# Patient Record
Sex: Male | Born: 1935 | Race: White | Hispanic: No | Marital: Married | Smoking: Former smoker
Health system: Southern US, Community
[De-identification: ages and names within clinical notes are randomized; demographics above are authoritative.]

## PROBLEM LIST (undated history)

## (undated) DIAGNOSIS — F0390 Unspecified dementia without behavioral disturbance: Secondary | ICD-10-CM

## (undated) DIAGNOSIS — Z87442 Personal history of urinary calculi: Secondary | ICD-10-CM

## (undated) DIAGNOSIS — R413 Other amnesia: Secondary | ICD-10-CM

## (undated) DIAGNOSIS — C801 Malignant (primary) neoplasm, unspecified: Secondary | ICD-10-CM

## (undated) DIAGNOSIS — I10 Essential (primary) hypertension: Secondary | ICD-10-CM

## (undated) DIAGNOSIS — N200 Calculus of kidney: Secondary | ICD-10-CM

## (undated) DIAGNOSIS — E039 Hypothyroidism, unspecified: Secondary | ICD-10-CM

## (undated) HISTORY — DX: Other amnesia: R41.3

## (undated) HISTORY — DX: Hypothyroidism, unspecified: E03.9

## (undated) HISTORY — PX: NISSEN FUNDOPLICATION: SHX2091

## (undated) HISTORY — PX: TOE AMPUTATION: SHX809

## (undated) HISTORY — PX: OTHER SURGICAL HISTORY: SHX169

## (undated) HISTORY — PX: EYE SURGERY: SHX253

## (undated) HISTORY — PX: CHOLECYSTECTOMY: SHX55

---

## 2015-01-11 ENCOUNTER — Emergency Department (HOSPITAL_BASED_OUTPATIENT_CLINIC_OR_DEPARTMENT_OTHER)
Admission: EM | Admit: 2015-01-11 | Discharge: 2015-01-11 | Disposition: A | Discharge status: Home / Self Care | Payer: Medicare Other | Attending: Emergency Medicine | Admitting: Emergency Medicine

## 2015-01-11 ENCOUNTER — Encounter (HOSPITAL_BASED_OUTPATIENT_CLINIC_OR_DEPARTMENT_OTHER): Payer: Self-pay | Admitting: Emergency Medicine

## 2015-01-11 ENCOUNTER — Emergency Department (HOSPITAL_BASED_OUTPATIENT_CLINIC_OR_DEPARTMENT_OTHER): Payer: Medicare Other

## 2015-01-11 DIAGNOSIS — Y9389 Activity, other specified: Secondary | ICD-10-CM | POA: Diagnosis not present

## 2015-01-11 DIAGNOSIS — W19XXXA Unspecified fall, initial encounter: Secondary | ICD-10-CM

## 2015-01-11 DIAGNOSIS — Z87891 Personal history of nicotine dependence: Secondary | ICD-10-CM | POA: Diagnosis not present

## 2015-01-11 DIAGNOSIS — Y998 Other external cause status: Secondary | ICD-10-CM | POA: Diagnosis not present

## 2015-01-11 DIAGNOSIS — Y92254 Theater (live) as the place of occurrence of the external cause: Secondary | ICD-10-CM | POA: Diagnosis not present

## 2015-01-11 DIAGNOSIS — S0181XA Laceration without foreign body of other part of head, initial encounter: Secondary | ICD-10-CM | POA: Insufficient documentation

## 2015-01-11 DIAGNOSIS — Z87442 Personal history of urinary calculi: Secondary | ICD-10-CM | POA: Diagnosis not present

## 2015-01-11 DIAGNOSIS — S0990XA Unspecified injury of head, initial encounter: Secondary | ICD-10-CM | POA: Diagnosis present

## 2015-01-11 DIAGNOSIS — W01198A Fall on same level from slipping, tripping and stumbling with subsequent striking against other object, initial encounter: Secondary | ICD-10-CM | POA: Diagnosis not present

## 2015-01-11 DIAGNOSIS — Z23 Encounter for immunization: Secondary | ICD-10-CM | POA: Diagnosis not present

## 2015-01-11 DIAGNOSIS — S0191XA Laceration without foreign body of unspecified part of head, initial encounter: Secondary | ICD-10-CM

## 2015-01-11 DIAGNOSIS — I10 Essential (primary) hypertension: Secondary | ICD-10-CM | POA: Insufficient documentation

## 2015-01-11 HISTORY — DX: Calculus of kidney: N20.0

## 2015-01-11 HISTORY — DX: Essential (primary) hypertension: I10

## 2015-01-11 MED ORDER — LIDOCAINE HCL (PF) 1 % IJ SOLN
INTRAMUSCULAR | Status: AC
  Filled 2015-01-11: qty 5

## 2015-01-11 MED ORDER — TETANUS-DIPHTH-ACELL PERTUSSIS 5-2.5-18.5 LF-MCG/0.5 IM SUSP
0.5000 mL | Freq: Once | INTRAMUSCULAR | Status: AC
  Administered 2015-01-11: 0.5 mL via INTRAMUSCULAR
  Filled 2015-01-11: qty 0.5

## 2015-01-11 MED ORDER — LIDOCAINE HCL (PF) 1 % IJ SOLN
5.0000 mL | Freq: Once | INTRAMUSCULAR | Status: AC
  Administered 2015-01-11: 5 mL

## 2015-01-11 MED ORDER — LIDOCAINE-EPINEPHRINE-TETRACAINE (LET) SOLUTION
3.0000 mL | Freq: Once | NASAL | Status: AC
  Administered 2015-01-11: 3 mL via TOPICAL
  Filled 2015-01-11: qty 3

## 2015-01-11 MED ORDER — LIDOCAINE-EPINEPHRINE (PF) 1 %-1:200000 IJ SOLN
20.0000 mL | Freq: Once | INTRAMUSCULAR | Status: DC
  Filled 2015-01-11: qty 20

## 2015-01-11 NOTE — ED Notes (Signed)
Pt alert, NAD, calm, interactive, resps e/u, speaking in clear complete sentences, VSS, (denies pain, sob, nausea, dizziness, weakness, sob, other injuries or pains or other sx), pt to CT at this time

## 2015-01-11 NOTE — Discharge Instructions (Signed)
Keep wound dry and do not remove dressing for 24 hours if possible. After that, wash gently morning and night (every 12 hours) with soap and water. Use a topical antibiotic ointment and cover with a bandaid or gauze.    Do NOT use rubbing alcohol or hydrogen peroxide, do not soak the area   After 10-14 days you can try to gently remove the outer part of the suture material with a tweezers. Your suture material is dissolving but the outside will not resolve.   Every attempt was made to remove foreign body (contaminants) from the wound.  However, there is always a chance that some may remain in the wound. This can  increase your risk of infection.   If you see signs of infection (warmth, redness, tenderness, pus, sharp increase in pain, fever, red streaking in the skin) immediately return to the emergency department.   After the wound heals fully, apply sunscreen for 6-12 months to minimize scarring.   Please follow with your primary care doctor in the next 2 days for a check-up. They must obtain records for further management.   Do not hesitate to return to the Emergency Department for any new, worsening or concerning symptoms.   Stitches, Staples, or Adhesive Wound Closure Health care providers use stitches (sutures), staples, and certain glue (skin adhesives) to hold skin together while it heals (wound closure). You may need this treatment after you have surgery or if you cut your skin accidentally. These methods help your skin to heal more quickly and make it less likely that you will have a scar. A wound may take several months to heal completely. The type of wound you have determines when your wound gets closed. In most cases, the wound is closed as soon as possible (primary skin closure). Sometimes, closure is delayed so the wound can be cleaned and allowed to heal naturally. This reduces the chance of infection. Delayed closure may be needed if your wound:  Is caused by a bite.  Happened  more than 6 hours ago.  Involves loss of skin or the tissues under the skin.  Has dirt or debris in it that cannot be removed.  Is infected. WHAT ARE THE DIFFERENT KINDS OF WOUND CLOSURES? There are many options for wound closure. The one that your health care provider uses depends on how deep and how large your wound is. Adhesive Glue To use this type of glue to close a wound, your health care provider holds the edges of the wound together and paints the glue on the surface of your skin. You may need more than one layer of glue. Then the wound may be covered with a light bandage (dressing). This type of skin closure may be used for small wounds that are not deep (superficial). Using glue for wound closure is less painful than other methods. It does not require a medicine that numbs the area (local anesthetic). This method also leaves nothing to be removed. Adhesive glue is often used for children and on facial wounds. Adhesive glue cannot be used for wounds that are deep, uneven, or bleeding. It is not used inside of a wound.  Adhesive Strips These strips are made of sticky (adhesive), porous paper. They are applied across your skin edges like a regular adhesive bandage. You leave them on until they fall off. Adhesive strips may be used to close very superficial wounds. They may also be used along with sutures to improve the closure of your skin edges.  Sutures Sutures are the oldest method of wound closure. Sutures can be made from natural substances, such as silk, or from synthetic materials, such as nylon and steel. They can be made from a material that your body can break down as your wound heals (absorbable), or they can be made from a material that needs to be removed from your skin (nonabsorbable). They come in many different strengths and sizes. Your health care provider attaches the sutures to a steel needle on one end. Sutures can be passed through your skin, or through the tissues  beneath your skin. Then they are tied and cut. Your skin edges may be closed in one continuous stitch or in separate stitches. Sutures are strong and can be used for all kinds of wounds. Absorbable sutures may be used to close tissues under the skin. The disadvantage of sutures is that they may cause skin reactions that lead to infection. Nonabsorbable sutures need to be removed. Staples When surgical staples are used to close a wound, the edges of your skin on both sides of the wound are brought close together. A staple is placed across the wound, and an instrument secures the edges together. Staples are often used to close surgical cuts (incisions). Staples are faster to use than sutures, and they cause less skin reaction. Staples need to be removed using a tool that bends the staples away from your skin. HOW DO I CARE FOR MY WOUND CLOSURE?  Take medicines only as directed by your health care provider.  If you were prescribed an antibiotic medicine for your wound, finish it all even if you start to feel better.  Use ointments or creams only as directed by your health care provider.  Wash your hands with soap and water before and after touching your wound.  Do not soak your wound in water. Do not take baths, swim, or use a hot tub until your health care provider approves.  Ask your health care provider when you can start showering. Cover your wound if directed by your health care provider.  Do not take out your own sutures or staples.  Do not pick at your wound. Picking can cause an infection.  Keep all follow-up visits as directed by your health care provider. This is important. HOW LONG WILL I HAVE MY WOUND CLOSURE?  Leave adhesive glue on your skin until the glue peels away.  Leave adhesive strips on your skin until the strips fall off.  Absorbable sutures will dissolve within several days.  Nonabsorbable sutures and staples must be removed. The location of the wound will  determine how long they stay in. This can range from several days to a couple of weeks. WHEN SHOULD I SEEK HELP FOR MY WOUND CLOSURE? Contact your health care provider if:  You have a fever.  You have chills.  You have drainage, redness, swelling, or pain at your wound.  There is a bad smell coming from your wound.  The skin edges of your wound start to separate after your sutures have been removed.  Your wound becomes thick, raised, and darker in color after your sutures come out (scarring).   This information is not intended to replace advice given to you by your health care provider. Make sure you discuss any questions you have with your health care provider.   Document Released: 11/09/2000 Document Revised: 03/07/2014 Document Reviewed: 07/24/2013 Elsevier Interactive Patient Education [employer].

## 2015-01-11 NOTE — ED Notes (Signed)
Pt in c/o laceration to R upper eyebrow after falling at the movie theater. Pt denies blood thinners, bleeding is controlled, he is alert, oriented and neuro intact.

## 2015-01-11 NOTE — ED Provider Notes (Signed)
CSN: [id number]     Arrival date & time 01/11/15  1908 History   First MD Initiated Contact with Patient 01/11/15 1913     Chief Complaint  Patient presents with  . Fall  . Head Laceration     (Consider location/radiation/quality/duration/timing/severity/associated sxs/prior Treatment) HPI   Blood pressure 145/66, pulse 61, temperature 97.5 F (36.4 C), temperature source Oral, resp. rate 20, height 5\' 10"  (1.778 m), weight 170 lb (77.111 kg), SpO2 100 %.  Joel Ibarra is a 80 y.o. male complaining of laceration to right temple after fall just prior to arrival. Patient is using a knee scooter secondary to nonhealing ulceration on left foot which required skin graft. He was exiting a movie in the ground was uneven, he fell forward striking his head. Patient is not anticoagulated. There was no loss of consciousness, no change in vision, nausea, vomiting, pain with eye movements or diplopia. There was no epistaxis, intraoral or dental pain, cervicalgia, chest pain, shortness of breath, abdominal pain, shoulder elbow, wrist knee or ankle pain. Patient has a mild trauma to the right second fingernail.  Past Medical History  Diagnosis Date  . Hypertension   . Kidney stones    History reviewed. No pertinent past surgical history. History reviewed. No pertinent family history. Social History  Substance Use Topics  . Smoking status: Former Research scientist (life sciences)  . Smokeless tobacco: None  . Alcohol Use: No    Review of Systems  10 systems reviewed and found to be negative, except as noted in the HPI.   Allergies  Bee venom  Home Medications   Prior to Admission medications   Not on File   BP 143/80 mmHg  Pulse 60  Temp(Src) 97.5 F (36.4 C) (Oral)  Resp 20  Ht 5\' 10"  (1.778 m)  Wt 170 lb (77.111 kg)  BMI 24.39 kg/m2  SpO2 96% Physical Exam  Constitutional: He is oriented to person, place, and time. He appears well-developed and well-nourished. No distress.  HENT:  Head:  Normocephalic.  Mouth/Throat: Oropharynx is clear and moist.  3 cm full-thickness laceration to left temple. No tenderness palpation along the orbital rim, no crepitance. Extraocular movement is intact without pain or diplopia, pupils are equal round and reactive. No blood in the nares. No intraoral blood or loose teeth  Eyes: Conjunctivae and EOM are normal. Pupils are equal, round, and reactive to light.  Neck: Normal range of motion.  Cardiovascular: Normal rate, regular rhythm and intact distal pulses.   Pulmonary/Chest: Effort normal and breath sounds normal.  Abdominal: Soft. Bowel sounds are normal. There is no tenderness.  Musculoskeletal: Normal range of motion.  Patient has surgical shoe and dressing in place 2 left foot  Neurological: He is alert and oriented to person, place, and time.  Skin: He is not diaphoretic.  Left third digit with some dried blood on the distal phalanx just underneath the nail bed. Nail is intact and adherent to the base.  Psychiatric: He has a normal mood and affect.  Nursing note and vitals reviewed.   ED Course  .Marland KitchenLaceration Repair Date/Time: 01/11/2015 9:52 PM Performed by: Monico Blitz Authorized by: Monico Blitz Consent: Verbal consent obtained. Risks and benefits: risks, benefits and alternatives were discussed Consent given by: patient Patient identity confirmed: verbally with patient Body area: head/neck Location details: left eyebrow Laceration length: 3 cm Contamination: The wound is contaminated. Foreign bodies: unknown Tendon involvement: none Nerve involvement: none Vascular damage: no Anesthesia: local infiltration Local anesthetic: lidocaine 1% without epinephrine  and LET (lido,epi,tetracaine) Anesthetic total: 3 ml Patient sedated: no Preparation: Patient was prepped and draped in the usual sterile fashion. Irrigation solution: saline Irrigation method: syringe Amount of cleaning: extensive Debridement:  none Degree of undermining: none Wound skin closure material used: 5-0 Vicryl Rapide. Number of sutures: 4 Technique: simple Approximation: close Approximation difficulty: simple Dressing: antibiotic ointment Patient tolerance: Patient tolerated the procedure well with no immediate complications   (including critical care time)   Labs Review Labs Reviewed - No data to display  Imaging Review No results found. I have personally reviewed and evaluated these images and lab results as part of my medical decision-making.   EKG Interpretation None      MDM   Final diagnoses:  Fall, initial encounter  Laceration of head, initial encounter    Filed Vitals:   01/11/15 1916 01/11/15 2000  BP: 143/80 145/66  Pulse: 60 61  Temp: 97.5 F (36.4 C)   TempSrc: Oral   Resp: 20   Height: 5\' 10"  (1.778 m)   Weight: 170 lb (77.111 kg)   SpO2: 96% 100%    Medications  lidocaine-EPINEPHrine (XYLOCAINE-EPINEPHrine) 1 %-1:200000 (PF) injection 20 mL (not administered)  Tdap (BOOSTRIX) injection 0.5 mL (0.5 mLs Intramuscular Given 01/11/15 1954)  lidocaine-EPINEPHrine-tetracaine (LET) solution (3 mLs Topical Given 01/11/15 1950)    Joel Ibarra is 80 y.o. male presenting with  facial laceration after patient scooter toppled over when it impacted her. There was head trauma with no loss of consciousness. Patient's neuro exam is nonfocal. He is not anticoagulated. He denies any cervical pain and any other trauma. Patient has laceration to left forehead which is cleaned and closed. Tetanus is updated. CT head and neck are negative.  This is a shared visit with the attending physician who personally evaluated the patient and agrees with the care plan.   Evaluation does not show pathology that would require ongoing emergent intervention or inpatient treatment. Pt is hemodynamically stable and mentating appropriately. Discussed findings and plan with patient/guardian, who agrees with care  plan. All questions answered. Return precautions discussed and outpatient follow up given.    Monico Blitz, PA-C 01/11/15 2154  Deno Etienne, DO 01/11/15 2319

## 2017-06-14 ENCOUNTER — Encounter (HOSPITAL_COMMUNITY): Payer: Self-pay

## 2017-06-14 ENCOUNTER — Inpatient Hospital Stay (HOSPITAL_COMMUNITY)
Admission: EM | Admit: 2017-06-14 | Discharge: 2017-06-20 | DRG: 373 | Disposition: A | Discharge status: Skilled Nursing Facility | Payer: Medicare Other | Attending: Internal Medicine | Admitting: Internal Medicine

## 2017-06-14 DIAGNOSIS — Z87891 Personal history of nicotine dependence: Secondary | ICD-10-CM

## 2017-06-14 DIAGNOSIS — R197 Diarrhea, unspecified: Secondary | ICD-10-CM | POA: Diagnosis present

## 2017-06-14 DIAGNOSIS — K529 Noninfective gastroenteritis and colitis, unspecified: Secondary | ICD-10-CM

## 2017-06-14 DIAGNOSIS — I10 Essential (primary) hypertension: Secondary | ICD-10-CM | POA: Diagnosis present

## 2017-06-14 DIAGNOSIS — F0390 Unspecified dementia without behavioral disturbance: Secondary | ICD-10-CM | POA: Diagnosis present

## 2017-06-14 DIAGNOSIS — E860 Dehydration: Secondary | ICD-10-CM

## 2017-06-14 DIAGNOSIS — R000 Tachycardia, unspecified: Secondary | ICD-10-CM

## 2017-06-14 DIAGNOSIS — Z7982 Long term (current) use of aspirin: Secondary | ICD-10-CM

## 2017-06-14 DIAGNOSIS — A0811 Acute gastroenteropathy due to Norwalk agent: Secondary | ICD-10-CM | POA: Diagnosis present

## 2017-06-14 DIAGNOSIS — A0472 Enterocolitis due to Clostridium difficile, not specified as recurrent: Principal | ICD-10-CM | POA: Diagnosis present

## 2017-06-14 DIAGNOSIS — Z79899 Other long term (current) drug therapy: Secondary | ICD-10-CM

## 2017-06-14 DIAGNOSIS — R112 Nausea with vomiting, unspecified: Secondary | ICD-10-CM

## 2017-06-14 HISTORY — DX: Unspecified dementia, unspecified severity, without behavioral disturbance, psychotic disturbance, mood disturbance, and anxiety: F03.90

## 2017-06-14 MED ORDER — SODIUM CHLORIDE 0.9 % IV BOLUS
1000.0000 mL | Freq: Once | INTRAVENOUS | Status: AC
  Administered 2017-06-14: 1000 mL via INTRAVENOUS

## 2017-06-14 NOTE — ED Notes (Signed)
Pt actively having diarrhea

## 2017-06-14 NOTE — ED Triage Notes (Signed)
Pt complains of diarrhea and a fever for two days Pt is admitted for the same sx this week

## 2017-06-14 NOTE — ED Provider Notes (Signed)
[facility] DEPT Provider Note: Georgena Spurling, MD, FACEP  CSN: [id number] MRN: [id number] ARRIVAL: 06/14/17 at 2128 ROOM: [facility]  Diarrhea  Level 5 caveat: Dementia HISTORY OF PRESENT ILLNESS  06/14/17 11:09 PM Joel Ibarra is a 83 y.o. male who developed nausea, vomiting and diarrhea yesterday.  His wife was recently hospitalized for similar symptoms.  The nausea and vomiting have resolved but he has had copious diarrhea throughout the day today.  The diarrhea has been nonbloody.  He has had associated abdominal pain but denies pain at the present time.  His daughters, who are providing the history, are not sure if he has had a fever.  He was noted to be tachycardic on arrival.  He is complaining of a dry mouth.   Past Medical History:  Diagnosis Date  . Dementia   . Hypertension   . Kidney stones     History reviewed. No pertinent surgical history.  History reviewed. No pertinent family history.  Social History   Tobacco Use  . Smoking status: Former Research scientist (life sciences)  . Smokeless tobacco: Never Used  Substance Use Topics  . Alcohol use: No  . Drug use: Never    Prior to Admission medications   Medication Sig Start Date End Date Taking? Authorizing Provider  aspirin EC 81 MG tablet Take 81 mg by mouth daily. 10/17/07  Yes [provider]  donepezil (ARICEPT) 10 MG tablet Take 5 mg by mouth 2 (two) times daily. 0.5 Tablet in AM, 0.5 Tablet at night 10/04/16  Yes [provider]  metoprolol succinate (TOPROL-XL) 100 MG 24 hr tablet Take 100 mg by mouth daily. 07/09/07  Yes [provider]  sertraline (ZOLOFT) 100 MG tablet Take 100 mg by mouth daily. 11/01/16  Yes [provider]    Allergies Bee venom; Codeine; Daptomycin; Clindamycin/lincomycin; Lincomycin; and Lactose   REVIEW OF SYSTEMS     PHYSICAL EXAMINATION  Initial Vital Signs Blood pressure (!) 145/82, pulse (!) 123, temperature 98.8 F (37.1 C),  temperature source Oral, resp. rate 20, SpO2 92 %.  Examination General: Well-developed, well-nourished male in no acute distress; appearance consistent with age of record HENT: normocephalic; atraumatic; dry mucous membranes Eyes: pupils equal, round and reactive to light; extraocular muscles intact; left pseudophakia Neck: supple Heart: regular rate and rhythm;  Lungs: clear to auscultation bilaterally Abdomen: soft; nondistended; nontender; bowel sounds present Extremities: Arthritic changes; pulses normal; surgical absence of left great toe Tachycardia neurologic: Awake, alert; motor function intact in all extremities and symmetric; no facial droop Skin: Warm and dry Psychiatric: Normal mood and affect   RESULTS  Summary of this visit's results, reviewed by myself:   EKG Interpretation  Date/Time:    Ventricular Rate:    PR Interval:    QRS Duration:   QT Interval:    QTC Calculation:   R Axis:     Text Interpretation:        Laboratory Studies: Results for orders placed or performed during the hospital encounter of 06/14/17 (from the past 24 hour(s))  Comprehensive metabolic panel     Status: Abnormal   Collection Time: 06/14/17 11:56 PM  Result Value Ref Range   Sodium 140 135 - 145 mmol/L   Potassium 3.9 3.5 - 5.1 mmol/L   Chloride 112 (H) 101 - 111 mmol/L   CO2 15 (L) 22 - 32 mmol/L   Glucose, Bld 201 (H) 65 - 99 mg/dL   BUN 24 (H) 6 - 20 mg/dL  Creatinine, Ser 1.25 (H) 0.61 - 1.24 mg/dL   Calcium 9.0 8.9 - 10.3 mg/dL   Total Protein 7.4 6.5 - 8.1 g/dL   Albumin 4.0 3.5 - 5.0 g/dL   AST 31 15 - 41 U/L   ALT 19 17 - 63 U/L   Alkaline Phosphatase 67 38 - 126 U/L   Total Bilirubin 1.0 0.3 - 1.2 mg/dL   GFR calc non Af Amer 53 (L) >60 mL/min   GFR calc Af Amer >60 >60 mL/min   Anion gap 13 5 - 15  CBC with Differential/Platelet     Status: Abnormal   Collection Time: 06/14/17 11:56 PM  Result Value Ref Range   WBC 6.1 4.0 - 10.5 K/uL   RBC 5.08 4.22 -  5.81 MIL/uL   Hemoglobin 15.8 13.0 - 17.0 g/dL   HCT 47.4 39.0 - 52.0 %   MCV 93.3 78.0 - 100.0 fL   MCH 31.1 26.0 - 34.0 pg   MCHC 33.3 30.0 - 36.0 g/dL   RDW 14.1 11.5 - 15.5 %   Platelets 127 (L) 150 - 400 K/uL   Neutrophils Relative % 96 %   Neutro Abs 5.9 1.7 - 7.7 K/uL   Lymphocytes Relative 2 %   Lymphs Abs 0.1 (L) 0.7 - 4.0 K/uL   Monocytes Relative 2 %   Monocytes Absolute 0.1 0.1 - 1.0 K/uL   Eosinophils Relative 0 %   Eosinophils Absolute 0.0 0.0 - 0.7 K/uL   Basophils Relative 0 %   Basophils Absolute 0.0 0.0 - 0.1 K/uL   Imaging Studies: No results found.  ED COURSE and MDM  Nursing notes and initial vitals signs, including pulse oximetry, reviewed.  Vitals:   06/14/17 2146 06/14/17 2257 06/15/17 0047  BP: (!) 145/82  129/78  Pulse: (!) 123  77  Resp: 20  18  Temp: 98.4 F (36.9 C) 98.8 F (37.1 C) 100 F (37.8 C)  TempSrc: Oral Oral Oral  SpO2: 92%  94%   11:33 PM IV fluid bolus initiated.  Patient placed on enteric precautions.   Hospitalist to admit patient for hydration.  Suspect viral gastroenteritis.  Stool studies ordered.  PROCEDURES    ED DIAGNOSES     ICD-10-CM   1. Gastroenteritis K52.9   2. Dehydration E86.0        Hara Milholland, Jenny Reichmann, MD 06/15/17 [phone]

## 2017-06-15 ENCOUNTER — Encounter (HOSPITAL_COMMUNITY): Payer: Self-pay | Admitting: Internal Medicine

## 2017-06-15 ENCOUNTER — Other Ambulatory Visit: Payer: Self-pay

## 2017-06-15 ENCOUNTER — Inpatient Hospital Stay (HOSPITAL_COMMUNITY): Payer: Medicare Other

## 2017-06-15 DIAGNOSIS — A0811 Acute gastroenteropathy due to Norwalk agent: Secondary | ICD-10-CM | POA: Diagnosis present

## 2017-06-15 DIAGNOSIS — R197 Diarrhea, unspecified: Secondary | ICD-10-CM | POA: Diagnosis not present

## 2017-06-15 DIAGNOSIS — R000 Tachycardia, unspecified: Secondary | ICD-10-CM

## 2017-06-15 DIAGNOSIS — R112 Nausea with vomiting, unspecified: Secondary | ICD-10-CM | POA: Diagnosis not present

## 2017-06-15 DIAGNOSIS — I10 Essential (primary) hypertension: Secondary | ICD-10-CM | POA: Diagnosis present

## 2017-06-15 DIAGNOSIS — E860 Dehydration: Secondary | ICD-10-CM | POA: Diagnosis present

## 2017-06-15 DIAGNOSIS — F0390 Unspecified dementia without behavioral disturbance: Secondary | ICD-10-CM | POA: Diagnosis present

## 2017-06-15 DIAGNOSIS — A09 Infectious gastroenteritis and colitis, unspecified: Secondary | ICD-10-CM | POA: Diagnosis not present

## 2017-06-15 DIAGNOSIS — A0472 Enterocolitis due to Clostridium difficile, not specified as recurrent: Secondary | ICD-10-CM | POA: Diagnosis present

## 2017-06-15 DIAGNOSIS — Z7982 Long term (current) use of aspirin: Secondary | ICD-10-CM | POA: Diagnosis not present

## 2017-06-15 DIAGNOSIS — Z87891 Personal history of nicotine dependence: Secondary | ICD-10-CM | POA: Diagnosis not present

## 2017-06-15 DIAGNOSIS — Z79899 Other long term (current) drug therapy: Secondary | ICD-10-CM | POA: Diagnosis not present

## 2017-06-15 LAB — CBC WITH DIFFERENTIAL/PLATELET
BASOS PCT: 0 %
Basophils Absolute: 0 10*3/uL (ref 0.0–0.1)
Eosinophils Absolute: 0 10*3/uL (ref 0.0–0.7)
Eosinophils Relative: 0 %
HEMATOCRIT: 47.4 % (ref 39.0–52.0)
Hemoglobin: 15.8 g/dL (ref 13.0–17.0)
Lymphocytes Relative: 2 %
Lymphs Abs: 0.1 10*3/uL — ABNORMAL LOW (ref 0.7–4.0)
MCH: 31.1 pg (ref 26.0–34.0)
MCHC: 33.3 g/dL (ref 30.0–36.0)
MCV: 93.3 fL (ref 78.0–100.0)
MONO ABS: 0.1 10*3/uL (ref 0.1–1.0)
Monocytes Relative: 2 %
NEUTROS ABS: 5.9 10*3/uL (ref 1.7–7.7)
Neutrophils Relative %: 96 %
PLATELETS: 127 10*3/uL — AB (ref 150–400)
RBC: 5.08 MIL/uL (ref 4.22–5.81)
RDW: 14.1 % (ref 11.5–15.5)
WBC: 6.1 10*3/uL (ref 4.0–10.5)

## 2017-06-15 LAB — URINALYSIS, ROUTINE W REFLEX MICROSCOPIC
Bilirubin Urine: NEGATIVE
GLUCOSE, UA: NEGATIVE mg/dL
Ketones, ur: NEGATIVE mg/dL
Leukocytes, UA: NEGATIVE
NITRITE: NEGATIVE
Protein, ur: 30 mg/dL — AB
SPECIFIC GRAVITY, URINE: 1.026 (ref 1.005–1.030)
pH: 5 (ref 5.0–8.0)

## 2017-06-15 LAB — CBC
HCT: 47.7 % (ref 39.0–52.0)
HEMOGLOBIN: 15.8 g/dL (ref 13.0–17.0)
MCH: 31.3 pg (ref 26.0–34.0)
MCHC: 33.1 g/dL (ref 30.0–36.0)
MCV: 94.5 fL (ref 78.0–100.0)
PLATELETS: 130 10*3/uL — AB (ref 150–400)
RBC: 5.05 MIL/uL (ref 4.22–5.81)
RDW: 14.3 % (ref 11.5–15.5)
WBC: 9.1 10*3/uL (ref 4.0–10.5)

## 2017-06-15 LAB — COMPREHENSIVE METABOLIC PANEL
ALBUMIN: 4 g/dL (ref 3.5–5.0)
ALK PHOS: 62 U/L (ref 38–126)
ALT: 19 U/L (ref 17–63)
ALT: 23 U/L (ref 17–63)
ANION GAP: 9 (ref 5–15)
ANION GAP: 13 (ref 5–15)
AST: 31 U/L (ref 15–41)
AST: 34 U/L (ref 15–41)
Albumin: 3.7 g/dL (ref 3.5–5.0)
Alkaline Phosphatase: 67 U/L (ref 38–126)
BILIRUBIN TOTAL: 0.9 mg/dL (ref 0.3–1.2)
BILIRUBIN TOTAL: 1 mg/dL (ref 0.3–1.2)
BUN: 20 mg/dL (ref 6–20)
BUN: 24 mg/dL — ABNORMAL HIGH (ref 6–20)
CALCIUM: 8.8 mg/dL — AB (ref 8.9–10.3)
CO2: 15 mmol/L — ABNORMAL LOW (ref 22–32)
CO2: 21 mmol/L — ABNORMAL LOW (ref 22–32)
Calcium: 9 mg/dL (ref 8.9–10.3)
Chloride: 112 mmol/L — ABNORMAL HIGH (ref 101–111)
Chloride: 116 mmol/L — ABNORMAL HIGH (ref 101–111)
Creatinine, Ser: 1.14 mg/dL (ref 0.61–1.24)
Creatinine, Ser: 1.25 mg/dL — ABNORMAL HIGH (ref 0.61–1.24)
GFR calc non Af Amer: 53 mL/min — ABNORMAL LOW (ref >60)
GFR, EST NON AFRICAN AMERICAN: 59 mL/min — AB (ref >60)
GLUCOSE: 201 mg/dL — AB (ref 65–99)
Glucose, Bld: 92 mg/dL (ref 65–99)
Potassium: 3.8 mmol/L (ref 3.5–5.1)
Potassium: 3.9 mmol/L (ref 3.5–5.1)
Sodium: 140 mmol/L (ref 135–145)
Sodium: 146 mmol/L — ABNORMAL HIGH (ref 135–145)
TOTAL PROTEIN: 7.2 g/dL (ref 6.5–8.1)
Total Protein: 7.4 g/dL (ref 6.5–8.1)

## 2017-06-15 LAB — GASTROINTESTINAL PANEL BY PCR, STOOL (REPLACES STOOL CULTURE)

## 2017-06-15 LAB — C DIFFICILE QUICK SCREEN W PCR REFLEX
C DIFFICILE (CDIFF) TOXIN: POSITIVE — AB
C DIFFICLE (CDIFF) ANTIGEN: POSITIVE — AB
C Diff interpretation: DETECTED

## 2017-06-15 LAB — HEMOGLOBIN A1C
HEMOGLOBIN A1C: 5.5 % (ref 4.8–5.6)
Mean Plasma Glucose: 111.15 mg/dL

## 2017-06-15 LAB — TSH: TSH: 2.198 u[IU]/mL (ref 0.350–4.500)

## 2017-06-15 LAB — CBG MONITORING, ED: Glucose-Capillary: 90 mg/dL (ref 65–99)

## 2017-06-15 LAB — TROPONIN I: TROPONIN I: 0.04 ng/mL — AB (ref <0.03)

## 2017-06-15 MED ORDER — DONEPEZIL HCL 5 MG PO TABS
5.0000 mg | ORAL_TABLET | Freq: Two times a day (BID) | ORAL | Status: DC
  Administered 2017-06-15 – 2017-06-20 (×11): 5 mg via ORAL
  Filled 2017-06-15 (×11): qty 1

## 2017-06-15 MED ORDER — METOPROLOL SUCCINATE ER 100 MG PO TB24
100.0000 mg | ORAL_TABLET | Freq: Every day | ORAL | Status: DC
  Administered 2017-06-15: 100 mg via ORAL
  Filled 2017-06-15: qty 1

## 2017-06-15 MED ORDER — CHLORHEXIDINE GLUCONATE 0.12 % MT SOLN
15.0000 mL | Freq: Two times a day (BID) | OROMUCOSAL | Status: DC
  Administered 2017-06-15 – 2017-06-20 (×10): 15 mL via OROMUCOSAL
  Filled 2017-06-15 (×10): qty 15

## 2017-06-15 MED ORDER — VANCOMYCIN 50 MG/ML ORAL SOLUTION
125.0000 mg | Freq: Four times a day (QID) | ORAL | Status: DC
  Administered 2017-06-15 – 2017-06-20 (×21): 125 mg via ORAL
  Filled 2017-06-15 (×27): qty 2.5

## 2017-06-15 MED ORDER — ONDANSETRON HCL 4 MG/2ML IJ SOLN: 4.0000 mg | Freq: Four times a day (QID) | INTRAMUSCULAR | Status: DC | PRN

## 2017-06-15 MED ORDER — TRAZODONE HCL 50 MG PO TABS
25.0000 mg | ORAL_TABLET | Freq: Every evening | ORAL | Status: DC | PRN
  Administered 2017-06-17 – 2017-06-19 (×3): 25 mg via ORAL
  Filled 2017-06-15 (×3): qty 1

## 2017-06-15 MED ORDER — SODIUM CHLORIDE 0.9 % IV SOLN: INTRAVENOUS | Status: DC

## 2017-06-15 MED ORDER — SODIUM CHLORIDE 0.9 % IV SOLN
INTRAVENOUS | Status: AC
  Administered 2017-06-15 (×2): via INTRAVENOUS

## 2017-06-15 MED ORDER — METRONIDAZOLE IN NACL 5-0.79 MG/ML-% IV SOLN
500.0000 mg | Freq: Three times a day (TID) | INTRAVENOUS | Status: DC
  Administered 2017-06-15: 500 mg via INTRAVENOUS
  Filled 2017-06-15: qty 100

## 2017-06-15 MED ORDER — SODIUM CHLORIDE 0.9% FLUSH
3.0000 mL | Freq: Two times a day (BID) | INTRAVENOUS | Status: DC
  Administered 2017-06-15 – 2017-06-20 (×9): 3 mL via INTRAVENOUS

## 2017-06-15 MED ORDER — ENOXAPARIN SODIUM 40 MG/0.4ML ~~LOC~~ SOLN
40.0000 mg | SUBCUTANEOUS | Status: DC
  Administered 2017-06-15 – 2017-06-20 (×6): 40 mg via SUBCUTANEOUS
  Filled 2017-06-15 (×7): qty 0.4

## 2017-06-15 MED ORDER — SERTRALINE HCL 100 MG PO TABS
100.0000 mg | ORAL_TABLET | Freq: Every day | ORAL | Status: DC
  Administered 2017-06-15 – 2017-06-20 (×6): 100 mg via ORAL
  Filled 2017-06-15: qty 1
  Filled 2017-06-15: qty 2
  Filled 2017-06-15 (×4): qty 1

## 2017-06-15 MED ORDER — ENOXAPARIN SODIUM 40 MG/0.4ML ~~LOC~~ SOLN: 40.0000 mg | SUBCUTANEOUS | Status: DC

## 2017-06-15 MED ORDER — ACETAMINOPHEN 325 MG PO TABS: 650.0000 mg | ORAL_TABLET | Freq: Four times a day (QID) | ORAL | Status: DC | PRN

## 2017-06-15 MED ORDER — ACETAMINOPHEN 650 MG RE SUPP: 650.0000 mg | Freq: Four times a day (QID) | RECTAL | Status: DC | PRN

## 2017-06-15 MED ORDER — ONDANSETRON HCL 4 MG/2ML IJ SOLN
4.0000 mg | Freq: Once | INTRAMUSCULAR | Status: AC
  Administered 2017-06-15: 4 mg via INTRAVENOUS
  Filled 2017-06-15: qty 2

## 2017-06-15 MED ORDER — ASPIRIN EC 81 MG PO TBEC
81.0000 mg | DELAYED_RELEASE_TABLET | Freq: Every day | ORAL | Status: DC
  Administered 2017-06-15 – 2017-06-20 (×6): 81 mg via ORAL
  Filled 2017-06-15 (×6): qty 1

## 2017-06-15 MED ORDER — ONDANSETRON HCL 4 MG PO TABS: 4.0000 mg | ORAL_TABLET | Freq: Four times a day (QID) | ORAL | Status: DC | PRN

## 2017-06-15 MED ORDER — LORAZEPAM 2 MG/ML IJ SOLN
0.5000 mg | Freq: Every day | INTRAMUSCULAR | Status: DC | PRN
  Administered 2017-06-15: 0.5 mg via INTRAVENOUS
  Filled 2017-06-15: qty 1

## 2017-06-15 MED ORDER — DEXTROSE-NACL 5-0.9 % IV SOLN
Freq: Once | INTRAVENOUS | Status: AC
  Administered 2017-06-15: 03:00:00 via INTRAVENOUS

## 2017-06-15 MED ORDER — ORAL CARE MOUTH RINSE
15.0000 mL | Freq: Two times a day (BID) | OROMUCOSAL | Status: DC
  Administered 2017-06-15 – 2017-06-20 (×6): 15 mL via OROMUCOSAL

## 2017-06-15 NOTE — ED Notes (Signed)
Upon this nurse taking report, this nurse noted patient's condom catheter was no longer properly fitted to patient penis. This nurse provided pericare and replaced condom catheter. Will attempt to collect urine sample at a later point.

## 2017-06-15 NOTE — H&P (Signed)
TRH H&P   Patient Demographics:    Raj Landress, is a 83 y.o. male  MRN: [id number]   DOB - August 30, 1934  Admit Date - 06/14/2017  Outpatient Primary MD for the patient is System, Pcp Not In  Referring MD/NP/PA: [place]  Outpatient Specialists:   Patient coming from: home  Chief Complaint  Patient presents with  . Diarrhea      HPI:    Mamoudou Mulvehill  is a 83 y.o. male, w Dementia, hypertension apparently has diarrhea since Tuesday.  His wife was admitted for diarrhea on Monday.  Pt states that he is having multiple stools "loose stool" per day.  Pt notes slight lower abdominal cramping.  1 episode of n/v in the ED.  Pt denies fever, chills, cough, cp, palp, sob, constipation, brbpr, black stool.    In Ed,  Na 140, K 3.9 Bun 24, Creatinine 1.25 Glucose 201 Ast 31, Alt 19  GI pathogen panel, and C. Diff pending Urinalysis pending  Pt will be admitted for n/v, diarrhea.      Review of systems:    In addition to the HPI above,  No Fever-chills, No Headache, No changes with Vision or hearing, No problems swallowing food or Liquids, No Chest pain, Cough or Shortness of Breath, No Blood in stool or Urine, No dysuria, No new skin rashes or bruises, No new joints pains-aches,  No new weakness, tingling, numbness in any extremity, No recent weight gain or loss, No polyuria, polydypsia or polyphagia, No significant Mental Stressors.  A full 10 point Review of Systems was done, except as stated above, all other Review of Systems were negative.   With Past History of the following :    Past Medical History:  Diagnosis Date  . Dementia   . Hypertension   . Kidney stones       History reviewed. No pertinent surgical history.    Social History:     Social History   Tobacco Use  . Smoking status: Former Research scientist (life sciences)  . Smokeless tobacco: Never Used    Substance Use Topics  . Alcohol use: No     Lives - at home  Mobility - unclear   Family History :     Family History  Family history unknown: Yes   Unknown due to dementia    Home Medications:   Prior to Admission medications   Medication Sig Start Date End Date Taking? Authorizing Provider  aspirin EC 81 MG tablet Take 81 mg by mouth daily. 10/17/07  Yes [provider]  donepezil (ARICEPT) 10 MG tablet Take 5 mg by mouth 2 (two) times daily. 0.5 Tablet in AM, 0.5 Tablet at night 10/04/16  Yes [provider]  metoprolol succinate (TOPROL-XL) 100 MG 24 hr tablet Take 100 mg by mouth daily. 07/09/07  Yes [provider]  sertraline (ZOLOFT) 100 MG tablet Take 100 mg  by mouth daily. 11/01/16  Yes [provider]     Allergies:     Allergies  Allergen Reactions  . Bee Venom Swelling  . Codeine Nausea Only, Nausea And Vomiting, Other (See Comments) and Swelling    Other Reaction: Other reaction Other reaction(s): Swelling Other reaction(s): Swelling Other reaction(s): Swelling   . Daptomycin Other (See Comments)    Pneumonitis  . Clindamycin/Lincomycin Rash  . Lincomycin Rash  . Lactose Other (See Comments) and Diarrhea    Other reaction(s): Cramps (ALLERGY/intolerance) Other reaction(s): Cramps (ALLERGY/intolerance) Other reaction(s): Cramps (ALLERGY/intolerance) Other reaction: Cramps (ALLERGY/intolerance)--Pt avoids ALL lactose containing items including items with lactose baked in. JLS 11/25/16. Other reaction(s): Cramps (ALLERGY/intolerance) Other reaction(s): Cramps (ALLERGY/intolerance) Other reaction(s): Cramps (ALLERGY/intolerance)      Physical Exam:   Vitals  Blood pressure 129/78, pulse 77, temperature 100 F (37.8 C), temperature source Oral, resp. rate 18, SpO2 94 %.   1. General  lying in bed in NAD,    2. Normal affect and insight, Not Suicidal or Homicidal, Awake Alert, Oriented X 1  3. No F.N deficits,  ALL C.Nerves Intact, Strength 5/5 all 4 extremities, Sensation intact all 4 extremities, Plantars down going.  4. Ears and Eyes appear Normal, Conjunctivae clear, PERRLA. Moist Oral Mucosa.  5. Supple Neck, No JVD, No cervical lymphadenopathy appriciated, No Carotid Bruits.  6. Symmetrical Chest wall movement, Good air movement bilaterally, CTAB.  7. RRR, No Gallops, Rubs or Murmurs, No Parasternal Heave.  8. Positive Bowel Sounds, Abdomen Soft, No tenderness, No organomegaly appriciated,No rebound -guarding or rigidity.  9.  No Cyanosis, Normal Skin Turgor, No Skin Rash or Bruise.  10. Good muscle tone,  joints appear normal , no effusions, Normal ROM.  11. No Palpable Lymph Nodes in Neck or Axillae     Data Review:    CBC Recent Labs  Lab 06/14/17 2356  WBC 6.1  HGB 15.8  HCT 47.4  PLT 127*  MCV 93.3  MCH 31.1  MCHC 33.3  RDW 14.1  LYMPHSABS 0.1*  MONOABS 0.1  EOSABS 0.0  BASOSABS 0.0   ------------------------------------------------------------------------------------------------------------------  Chemistries  Recent Labs  Lab 06/14/17 2356  NA 140  K 3.9  CL 112*  CO2 15*  GLUCOSE 201*  BUN 24*  CREATININE 1.25*  CALCIUM 9.0  AST 31  ALT 19  ALKPHOS 67  BILITOT 1.0   ------------------------------------------------------------------------------------------------------------------ CrCl cannot be calculated (Unknown ideal weight.). ------------------------------------------------------------------------------------------------------------------ No results for input(s): TSH, T4TOTAL, T3FREE, THYROIDAB in the last 72 hours.  Invalid input(s): FREET3  Coagulation profile No results for input(s): INR, PROTIME in the last 168 hours. ------------------------------------------------------------------------------------------------------------------- No results for input(s): DDIMER in the last 72  hours. -------------------------------------------------------------------------------------------------------------------  Cardiac Enzymes No results for input(s): CKMB, TROPONINI, MYOGLOBIN in the last 168 hours.  Invalid input(s): CK ------------------------------------------------------------------------------------------------------------------ No results found for: BNP   ---------------------------------------------------------------------------------------------------------------  Urinalysis No results found for: COLORURINE, APPEARANCEUR, LABSPEC, [place], GLUCOSEU, HGBUR, BILIRUBINUR, KETONESUR, PROTEINUR, UROBILINOGEN, NITRITE, LEUKOCYTESUR  ----------------------------------------------------------------------------------------------------------------   Imaging Results:    No results found.     Assessment & Plan:    Principal Problem:   Diarrhea Active Problems:   Nausea & vomiting   Tachycardia   Diarrhea Stool for C. Diff Gi pathogen panel Flagyl 500mg  iv q8h  N/v Zofran 4mg  iv q6h prn  Abdominal discomfort Check CT scan abd/ pelvis  Tachycardia Tele 12 lead ekg Trop I q6h x3 Check TSH   DVT Prophylaxis  Lovenox - SCDs   AM Labs Ordered, also  please review Full Orders  Family Communication: Admission, patients condition and plan of care including tests being ordered have been discussed with the patient  who indicate understanding and agree with the plan and Code Status.  Code Status FULL CODE  Likely DC to  home  Condition GUARDED    Consults called: none  Admission status: inpatient   Time spent in minutes : 45   Jani Gravel M.D on 06/15/2017 at 2:46 AM  Between 7am to 7pm - Pager - [phone]   After 7pm go to [email or web address] - password [facility]  [facility] - Office  [phone]

## 2017-06-15 NOTE — Progress Notes (Signed)
GI Panel positive for Norovirus;  Attending MD notified via text page

## 2017-06-15 NOTE — ED Notes (Signed)
Called to give report to [facility] , in who stated that she spoke with [facility] to have pt re-routed, stated she would contact [facility]. Charge RN made aware.

## 2017-06-15 NOTE — Progress Notes (Signed)
PROGRESS NOTE  Joel Ibarra LGX:[id number] DOB: 21-Mar-1934 DOA: 06/14/2017 PCP: System, Pcp Not In  HPI/Recap of past 24 hours: Joel Ibarra  is a 83 y.o. male, with past medical history significant for dementia, hypertension presents to the ED complaining of watery diarrhea with multiple episodes per day for the past 2 days prior to admission.  Patient reports associated mild lower abdominal cramping, and had one episode of nausea and vomiting in the ED.  Patient denies any fever/chills.  Of note, his wife was admitted for diarrhea on Monday here at [facility].  Patient was found to be positive for C. Difficile.  Patient admitted for further management.   Today, patient still having diarrhea, reports improved lower abdominal cramping, no further vomiting.  No fever noted.  Denies any chest pain, shortness of breath, bloody diarrhea, dizziness.   Assessment/Plan: Principal Problem:   Diarrhea Active Problems:   Nausea & vomiting   Tachycardia  C. difficile colitis Afebrile, no leukocytosis Still having multiple episodes of watery diarrhea + mild lower abdominal cramping C. difficile toxin positive stool CT scan of the abdomen showed mild wall thickening and stranding around the descending colon concerning for early infectious or ischemic colitis Started patient on oral vancomycin for 10 days Continue IV fluids Enteric precautions, monitor closely  Hypertension BP currently soft Hold home metoprolol  Dementia Continue Aricept   Code Status: Full  Family Communication: Spoke with daughter at bedside  Disposition Plan: Home once diarrhea resolves   Consultants:  None  Procedures:  None  Antimicrobials:  Oral vancomycin  DVT prophylaxis: Lovenox   Objective: Vitals:   06/15/17 0915 06/15/17 1024 06/15/17 1100 06/15/17 1300  BP: (!) 134/91 135/63 130/72 (!) 119/55  Pulse: (!) 53 66 60 (!) 58  Resp: (!) 29 (!) 22 18 (!) 29  Temp:        TempSrc:      SpO2: 100% 97% 100% 97%    Intake/Output Summary (Last 24 hours) at 06/15/2017 1347 Last data filed at 06/15/2017 0159 Gross per 24 hour  Intake 2000 ml  Output -  Net 2000 ml   There were no vitals filed for this visit.  Exam:   General: NAD  Cardiovascular: S1, S2 present  Respiratory: CTAB  Abdomen: Soft, nondistended, mild tenderness on lower quadrants, bowel sounds present  Musculoskeletal: No pedal edema bilaterally  Skin: Normal  Psychiatry: Normal mood   Data Reviewed: CBC: Recent Labs  Lab 06/14/17 2356 06/15/17 1037  WBC 6.1 9.1  NEUTROABS 5.9  --   HGB 15.8 15.8  HCT 47.4 47.7  MCV 93.3 94.5  PLT 127* 814*   Basic Metabolic Panel: Recent Labs  Lab 06/14/17 2356 06/15/17 1037  NA 140 146*  K 3.9 3.8  CL 112* 116*  CO2 15* 21*  GLUCOSE 201* 92  BUN 24* 20  CREATININE 1.25* 1.14  CALCIUM 9.0 8.8*   GFR: CrCl cannot be calculated (Unknown ideal weight.). Liver Function Tests: Recent Labs  Lab 06/14/17 2356 06/15/17 1037  AST 31 34  ALT 19 23  ALKPHOS 67 62  BILITOT 1.0 0.9  PROT 7.4 7.2  ALBUMIN 4.0 3.7   No results for input(s): LIPASE, AMYLASE in the last 168 hours. No results for input(s): AMMONIA in the last 168 hours. Coagulation Profile: No results for input(s): INR, PROTIME in the last 168 hours. Cardiac Enzymes: Recent Labs  Lab 06/15/17 1037  TROPONINI 0.04*   BNP (last 3 results) No results for input(s):  PROBNP in the last 8760 hours. HbA1C: No results for input(s): HGBA1C in the last 72 hours. CBG: Recent Labs  Lab 06/15/17 1002  GLUCAP 90   Lipid Profile: No results for input(s): CHOL, HDL, LDLCALC, TRIG, CHOLHDL, LDLDIRECT in the last 72 hours. Thyroid Function Tests: Recent Labs    06/15/17 1037  TSH 2.198   Anemia Panel: No results for input(s): VITAMINB12, FOLATE, FERRITIN, TIBC, IRON, RETICCTPCT in the last 72 hours. Urine analysis: No results found for: COLORURINE,  APPEARANCEUR, LABSPEC, PHURINE, GLUCOSEU, HGBUR, BILIRUBINUR, KETONESUR, PROTEINUR, UROBILINOGEN, NITRITE, LEUKOCYTESUR Sepsis Labs: @LABRCNTIP (procalcitonin:4,lacticidven:4)  ) Recent Results (from the past 240 hour(s))  C difficile quick scan w PCR reflex     Status: Abnormal   Collection Time: 06/15/17  2:31 AM  Result Value Ref Range Status   C Diff antigen POSITIVE (A) NEGATIVE Final   C Diff toxin POSITIVE (A) NEGATIVE Final   C Diff interpretation Toxin producing C. difficile detected.  Final    Comment: CRITICAL RESULT CALLED TO, READ BACK BY AND VERIFIED WITH: L ADKINS,RN @0323  06/15/17 MKELLY Performed at [facility], [place] [address] [place], [place] [address]       Studies: Ct Abdomen Pelvis Wo Contrast  Result Date: 06/15/2017 CLINICAL DATA:  Nausea, vomiting EXAM: CT ABDOMEN AND PELVIS WITHOUT CONTRAST TECHNIQUE: Multidetector CT imaging of the abdomen and pelvis was performed following the standard protocol without IV contrast. COMPARISON:  None. FINDINGS: Lower chest: Small nodule in the inferior right middle lobe measures 5 mm. Scarring in the lung bases bilaterally. Heart is borderline enlarged. No effusions. Hepatobiliary: No focal liver abnormality is seen. Status post cholecystectomy. No biliary dilatation. Pancreas: No focal abnormality or ductal dilatation. Spleen: No focal abnormality.  Normal size. Adrenals/Urinary Tract: Punctate nonobstructing stone in the midpole of the left kidney. No ureteral stones or hydronephrosis. No renal or adrenal mass. Urinary bladder unremarkable. Stomach/Bowel: Scattered colonic diverticulosis. There is mild apparent wall thickening in the descending colon diffusely with slight haziness around the descending colon suggesting mild colitis. This could be infectious or ischemic. Small duodenal diverticulum in the region of the pancreatic head. Stomach is unremarkable. No evidence of bowel obstruction. Vascular/Lymphatic:  Aortic atherosclerosis. No enlarged abdominal or pelvic lymph nodes. Reproductive: Mild prostate enlargement with central calcifications. Other: No free fluid or free air. Small bilateral inguinal hernias containing fat. Musculoskeletal: Degenerative changes in the lumbar spine. No acute bony abnormality. IMPRESSION: 5 mm right middle lobe pulmonary nodule. No follow-up needed if patient is low-risk. Non-contrast chest CT can be considered in 12 months if patient is high-risk. This recommendation follows the consensus statement: Guidelines for Management of Incidental Pulmonary Nodules Detected on CT Images: From the Fleischner Society 2017; Radiology 2017; 284:228-243. Scattered colonic diverticulosis. Mild wall thickening and stranding around the descending colon concerning for early infectious or ischemic colitis. Aortoiliac atherosclerosis. Prior cholecystectomy. Electronically Signed   By: Rolm Baptise M.D.   On: 06/15/2017 10:24    Scheduled Meds: . aspirin EC  81 mg Oral Daily  . donepezil  5 mg Oral BID  . enoxaparin (LOVENOX) injection  40 mg Subcutaneous Q24H  . metoprolol succinate  100 mg Oral Daily  . sertraline  100 mg Oral Daily  . sodium chloride flush  3 mL Intravenous Q12H  . vancomycin  125 mg Oral QID    Continuous Infusions: . sodium chloride 75 mL/hr at 06/15/17 1031     LOS: 0 days     Alma Friendly,  MD [facility]   If 7PM-7AM, please contact night-coverage [email or web address] Password [email or web address] 06/15/2017, 1:47 PM

## 2017-06-15 NOTE — ED Notes (Signed)
ED TO INPATIENT HANDOFF REPORT  Name/Age/Gender Joel Ibarra 83 y.o. male  Code Status    Code Status Orders  (From admission, onward)        Start     Ordered   06/15/17 0915  Full code  Continuous     06/15/17 0914    Code Status History    Date Active Date Inactive Code Status Order ID Comments User Context   06/15/2017 0743 06/15/2017 0914 Full Code [id number]  Alma Friendly, MD ED      Home/SNF/Other Home  Chief Complaint Diarrhea  Level of Care/Admitting Diagnosis ED Disposition    ED Disposition Condition [facility] Area: [facility] [[id number]]  Level of Care: Telemetry [5]  Admit to tele based on following criteria: Monitor for Ischemic changes  Diagnosis: Diarrhea [787.91.ICD-9-CM]  Admitting Physician: Jani Gravel [[id number]]  Attending Physician: Jani Gravel [id number]  Estimated length of stay: past midnight tomorrow  Certification:: I certify this patient will need inpatient services for at least 2 midnights  PT Class (Do Not Modify): Inpatient [101]  PT Acc Code (Do Not Modify): Private [1]       Medical History Past Medical History:  Diagnosis Date  . Dementia   . Hypertension   . Kidney stones     Allergies Allergies  Allergen Reactions  . Bee Venom Swelling  . Codeine Nausea Only, Nausea And Vomiting, Other (See Comments) and Swelling    Other Reaction: Other reaction Other reaction(s): Swelling Other reaction(s): Swelling Other reaction(s): Swelling   . Daptomycin Other (See Comments)    Pneumonitis  . Clindamycin/Lincomycin Rash  . Lincomycin Rash  . Lactose Other (See Comments) and Diarrhea    Other reaction(s): Cramps (ALLERGY/intolerance) Other reaction(s): Cramps (ALLERGY/intolerance) Other reaction(s): Cramps (ALLERGY/intolerance) Other reaction: Cramps (ALLERGY/intolerance)--Pt avoids ALL lactose containing items including items with lactose baked in. JLS 11/25/16. Other reaction(s): Cramps  (ALLERGY/intolerance) Other reaction(s): Cramps (ALLERGY/intolerance) Other reaction(s): Cramps (ALLERGY/intolerance)     IV Location/Drains/Wounds Patient Lines/Drains/Airways Status   Active Line/Drains/Airways    Name:   Placement date:   Placement time:   Site:   Days:   Peripheral IV 06/15/17 Right Antecubital   06/15/17    1437    Antecubital   less than 1          Labs/Imaging Results for orders placed or performed during the hospital encounter of 06/14/17 (from the past 48 hour(s))  Comprehensive metabolic panel     Status: Abnormal   Collection Time: 06/14/17 11:56 PM  Result Value Ref Range   Sodium 140 135 - 145 mmol/L   Potassium 3.9 3.5 - 5.1 mmol/L   Chloride 112 (H) 101 - 111 mmol/L   CO2 15 (L) 22 - 32 mmol/L   Glucose, Bld 201 (H) 65 - 99 mg/dL   BUN 24 (H) 6 - 20 mg/dL   Creatinine, Ser 1.25 (H) 0.61 - 1.24 mg/dL   Calcium 9.0 8.9 - 10.3 mg/dL   Total Protein 7.4 6.5 - 8.1 g/dL   Albumin 4.0 3.5 - 5.0 g/dL   AST 31 15 - 41 U/L   ALT 19 17 - 63 U/L   Alkaline Phosphatase 67 38 - 126 U/L   Total Bilirubin 1.0 0.3 - 1.2 mg/dL   GFR calc non Af Amer 53 (L) >60 mL/min   GFR calc Af Amer >60 >60 mL/min    Comment: (NOTE) The eGFR has been calculated using the CKD EPI equation. This  calculation has not been validated in all clinical situations. eGFR's persistently <60 mL/min signify possible Chronic Kidney Disease.    Anion gap 13 5 - 15    Comment: Performed at [facility], [place] [address] [place], [place] [address]  CBC with Differential/Platelet     Status: Abnormal   Collection Time: 06/14/17 11:56 PM  Result Value Ref Range   WBC 6.1 4.0 - 10.5 K/uL   RBC 5.08 4.22 - 5.81 MIL/uL   Hemoglobin 15.8 13.0 - 17.0 g/dL   HCT 47.4 39.0 - 52.0 %   MCV 93.3 78.0 - 100.0 fL   MCH 31.1 26.0 - 34.0 pg   MCHC 33.3 30.0 - 36.0 g/dL   RDW 14.1 11.5 - 15.5 %   Platelets 127 (L) 150 - 400 K/uL   Neutrophils Relative % 96 %   Neutro Abs 5.9  1.7 - 7.7 K/uL   Lymphocytes Relative 2 %   Lymphs Abs 0.1 (L) 0.7 - 4.0 K/uL   Monocytes Relative 2 %   Monocytes Absolute 0.1 0.1 - 1.0 K/uL   Eosinophils Relative 0 %   Eosinophils Absolute 0.0 0.0 - 0.7 K/uL   Basophils Relative 0 %   Basophils Absolute 0.0 0.0 - 0.1 K/uL    Comment: Performed at [facility], [place] [address] [place], [place] [address]  C difficile quick scan w PCR reflex     Status: Abnormal   Collection Time: 06/15/17  2:31 AM  Result Value Ref Range   C Diff antigen POSITIVE (A) NEGATIVE   C Diff toxin POSITIVE (A) NEGATIVE   C Diff interpretation Toxin producing C. difficile detected.     Comment: CRITICAL RESULT CALLED TO, READ BACK BY AND VERIFIED WITH: L ADKINS,RN @0323  06/15/17 MKELLY Performed at [facility], [place] [address], [place], [place] [address]   CBG monitoring, ED     Status: None   Collection Time: 06/15/17 10:02 AM  Result Value Ref Range   Glucose-Capillary 90 65 - 99 mg/dL  Comprehensive metabolic panel     Status: Abnormal   Collection Time: 06/15/17 10:37 AM  Result Value Ref Range   Sodium 146 (H) 135 - 145 mmol/L   Potassium 3.8 3.5 - 5.1 mmol/L   Chloride 116 (H) 101 - 111 mmol/L   CO2 21 (L) 22 - 32 mmol/L   Glucose, Bld 92 65 - 99 mg/dL   BUN 20 6 - 20 mg/dL   Creatinine, Ser 1.14 0.61 - 1.24 mg/dL   Calcium 8.8 (L) 8.9 - 10.3 mg/dL   Total Protein 7.2 6.5 - 8.1 g/dL   Albumin 3.7 3.5 - 5.0 g/dL   AST 34 15 - 41 U/L   ALT 23 17 - 63 U/L   Alkaline Phosphatase 62 38 - 126 U/L   Total Bilirubin 0.9 0.3 - 1.2 mg/dL   GFR calc non Af Amer 59 (L) >60 mL/min   GFR calc Af Amer >60 >60 mL/min    Comment: (NOTE) The eGFR has been calculated using the CKD EPI equation. This calculation has not been validated in all clinical situations. eGFR's persistently <60 mL/min signify possible Chronic Kidney Disease.    Anion gap 9 5 - 15    Comment: Performed at [facility], [place] [address] [place], [place] [address]  CBC     Status: Abnormal   Collection Time: 06/15/17 10:37 AM  Result Value Ref Range   WBC 9.1 4.0 -  10.5 K/uL   RBC 5.05 4.22 - 5.81 MIL/uL   Hemoglobin 15.8 13.0 - 17.0 g/dL   HCT 47.7 39.0 - 52.0 %   MCV 94.5 78.0 - 100.0 fL   MCH 31.3 26.0 - 34.0 pg   MCHC 33.1 30.0 - 36.0 g/dL   RDW 14.3 11.5 - 15.5 %   Platelets 130 (L) 150 - 400 K/uL    Comment: Performed at [facility], [place] [address], [place], [place] [address]  TSH     Status: None   Collection Time: 06/15/17 10:37 AM  Result Value Ref Range   TSH 2.198 0.350 - 4.500 uIU/mL    Comment: Performed by a 3rd Generation assay with a functional sensitivity of <=0.01 uIU/mL. Performed at [facility], [place] [address], [place], [place] [address]   Troponin I     Status: Abnormal   Collection Time: 06/15/17 10:37 AM  Result Value Ref Range   Troponin I 0.04 (HH) <0.03 ng/mL    Comment: CRITICAL RESULT CALLED TO, READ BACK BY AND VERIFIED WITH: RAND,K RN AT 1217 06/15/17 TIBBITTS, K Performed at [facility], [place] [address] [place], [place] [address]   Hemoglobin A1c     Status: None   Collection Time: 06/15/17 10:37 AM  Result Value Ref Range   Hgb A1c MFr Bld 5.5 4.8 - 5.6 %    Comment: (NOTE) Pre diabetes:          5.7%-6.4% Diabetes:              >6.4% Glycemic control for   <7.0% adults with diabetes    Mean Plasma Glucose 111.15 mg/dL    Comment: Performed at [place] [address], [place], [place] [address]   Ct Abdomen Pelvis Wo Contrast  Result Date: 06/15/2017 CLINICAL DATA:  Nausea, vomiting EXAM: CT ABDOMEN AND PELVIS WITHOUT CONTRAST TECHNIQUE: Multidetector CT imaging of the abdomen and pelvis was performed following the standard protocol without IV contrast. COMPARISON:  None. FINDINGS: Lower chest: Small nodule in the inferior right middle lobe measures 5 mm. Scarring in the lung bases  bilaterally. Heart is borderline enlarged. No effusions. Hepatobiliary: No focal liver abnormality is seen. Status post cholecystectomy. No biliary dilatation. Pancreas: No focal abnormality or ductal dilatation. Spleen: No focal abnormality.  Normal size. Adrenals/Urinary Tract: Punctate nonobstructing stone in the midpole of the left kidney. No ureteral stones or hydronephrosis. No renal or adrenal mass. Urinary bladder unremarkable. Stomach/Bowel: Scattered colonic diverticulosis. There is mild apparent wall thickening in the descending colon diffusely with slight haziness around the descending colon suggesting mild colitis. This could be infectious or ischemic. Small duodenal diverticulum in the region of the pancreatic head. Stomach is unremarkable. No evidence of bowel obstruction. Vascular/Lymphatic: Aortic atherosclerosis. No enlarged abdominal or pelvic lymph nodes. Reproductive: Mild prostate enlargement with central calcifications. Other: No free fluid or free air. Small bilateral inguinal hernias containing fat. Musculoskeletal: Degenerative changes in the lumbar spine. No acute bony abnormality. IMPRESSION: 5 mm right middle lobe pulmonary nodule. No follow-up needed if patient is low-risk. Non-contrast chest CT can be considered in 12 months if patient is high-risk. This recommendation follows the consensus statement: Guidelines for Management of Incidental Pulmonary Nodules Detected on CT Images: From the Fleischner Society 2017; Radiology 2017; 284:228-243. Scattered colonic diverticulosis. Mild wall thickening and stranding around the descending colon concerning for early infectious or ischemic colitis. Aortoiliac atherosclerosis. Prior cholecystectomy. Electronically Signed   By: Lennette Bihari  [place] M.D.   On: 06/15/2017 10:24    Pending Labs Unresulted Labs (From admission, onward)   Start     Ordered   06/16/17 0500  CBC with Differential/Platelet  Daily,   R     06/15/17 1404   06/16/17 9379   Basic metabolic panel  Daily,   R     06/15/17 1404   06/14/17 2321  Urinalysis, Routine w reflex microscopic  Once,   R     06/14/17 2320   06/14/17 2320  Gastrointestinal Panel by PCR , Stool  (Gastrointestinal Panel by PCR, Stool)  STAT,   R     06/14/17 2320      Vitals/Pain Today's Vitals   06/15/17 1024 06/15/17 1100 06/15/17 1200 06/15/17 1300  BP: 135/63 130/72 127/81 (!) 119/55  Pulse: 66 60 64 (!) 58  Resp: (!) 22 18 (!) 25 (!) 29  Temp:      TempSrc:      SpO2: 97% 100% 100% 97%  PainSc:        Isolation Precautions Enteric precautions (UV disinfection)  Medications Medications  aspirin EC tablet 81 mg (81 mg Oral Given 06/15/17 1032)  donepezil (ARICEPT) tablet 5 mg (5 mg Oral Given 06/15/17 1032)  sertraline (ZOLOFT) tablet 100 mg (100 mg Oral Given 06/15/17 1032)  0.9 %  sodium chloride infusion ( Intravenous New Bag/Given 06/15/17 1031)  acetaminophen (TYLENOL) tablet 650 mg (has no administration in time range)    Or  acetaminophen (TYLENOL) suppository 650 mg (has no administration in time range)  vancomycin (VANCOCIN) 50 mg/mL oral solution 125 mg (125 mg Oral Given 06/15/17 1033)  enoxaparin (LOVENOX) injection 40 mg (40 mg Subcutaneous Given 06/15/17 0900)  sodium chloride flush (NS) 0.9 % injection 3 mL (3 mLs Intravenous Given 06/15/17 1032)  traZODone (DESYREL) tablet 25 mg (has no administration in time range)  ondansetron (ZOFRAN) tablet 4 mg (has no administration in time range)    Or  ondansetron (ZOFRAN) injection 4 mg (has no administration in time range)  sodium chloride 0.9 % bolus 1,000 mL (0 mLs Intravenous Stopped 06/15/17 0159)  ondansetron (ZOFRAN) injection 4 mg (4 mg Intravenous Given 06/15/17 0046)  dextrose 5 %-0.9 % sodium chloride infusion ( Intravenous Stopped 06/15/17 0854)    Mobility walks with device

## 2017-06-15 NOTE — ED Notes (Signed)
Pt pulled IV out and condom cath off. Pt continues to have bloody stools

## 2017-06-16 DIAGNOSIS — F0390 Unspecified dementia without behavioral disturbance: Secondary | ICD-10-CM

## 2017-06-16 DIAGNOSIS — I10 Essential (primary) hypertension: Secondary | ICD-10-CM

## 2017-06-16 LAB — CBC WITH DIFFERENTIAL/PLATELET
BASOS ABS: 0 10*3/uL (ref 0.0–0.1)
Basophils Relative: 0 %
EOS PCT: 1 %
Eosinophils Absolute: 0.1 10*3/uL (ref 0.0–0.7)
HEMATOCRIT: 46.4 % (ref 39.0–52.0)
Hemoglobin: 15.1 g/dL (ref 13.0–17.0)
LYMPHS ABS: 1.4 10*3/uL (ref 0.7–4.0)
Lymphocytes Relative: 13 %
MCH: 30.2 pg (ref 26.0–34.0)
MCHC: 32.5 g/dL (ref 30.0–36.0)
MCV: 92.8 fL (ref 78.0–100.0)
MONOS PCT: 15 %
Monocytes Absolute: 1.6 10*3/uL — ABNORMAL HIGH (ref 0.1–1.0)
NEUTROS ABS: 7.8 10*3/uL — AB (ref 1.7–7.7)
Neutrophils Relative %: 71 %
Platelets: 95 10*3/uL — ABNORMAL LOW (ref 150–400)
RBC: 5 MIL/uL (ref 4.22–5.81)
RDW: 14.4 % (ref 11.5–15.5)
WBC: 10.9 10*3/uL — ABNORMAL HIGH (ref 4.0–10.5)

## 2017-06-16 LAB — GLUCOSE, CAPILLARY: Glucose-Capillary: 108 mg/dL — ABNORMAL HIGH (ref 65–99)

## 2017-06-16 LAB — BASIC METABOLIC PANEL
Anion gap: 8 (ref 5–15)
BUN: 19 mg/dL (ref 6–20)
CALCIUM: 8.3 mg/dL — AB (ref 8.9–10.3)
CO2: 19 mmol/L — AB (ref 22–32)
CREATININE: 1.07 mg/dL (ref 0.61–1.24)
Chloride: 114 mmol/L — ABNORMAL HIGH (ref 101–111)
GFR calc Af Amer: >60 mL/min (ref >60)
GFR calc non Af Amer: >60 mL/min (ref >60)
GLUCOSE: 104 mg/dL — AB (ref 65–99)
Potassium: 3.5 mmol/L (ref 3.5–5.1)
Sodium: 141 mmol/L (ref 135–145)

## 2017-06-16 NOTE — Progress Notes (Signed)
PROGRESS NOTE  Joel Ibarra PPJ:[id number] DOB: [birth year] DOA: 06/14/2017 PCP: System, Pcp Not In  HPI/Recap of past 24 hours: Joel Ibarra  is a 83 y.o. male, with past medical history significant for dementia, hypertension presents to the ED complaining of watery diarrhea with multiple episodes per day for the past 2 days prior to admission.  Patient reports associated mild lower abdominal cramping, and had one episode of nausea and vomiting in the ED.  Patient denies any fever/chills.  Of note, his wife was admitted for diarrhea on Monday here at [facility].  Patient was found to be positive for C. Difficile.  Patient admitted for further management.   Today, patient still having watery diarrhea, but has reduced in episodes. Denies any abdominal pain, no further vomiting.  No fever noted. Met patient sitting up in chair.   Assessment/Plan: Principal Problem:   Diarrhea Active Problems:   Nausea & vomiting   Tachycardia  C. difficile colitis Afebrile, very mild leukocytosis Still having watery diarrhea, reduced in episodes C. difficile toxin positive stool, stool also positive for norovirus CT scan of the abdomen showed mild wall thickening and stranding around the descending colon concerning for early infectious or ischemic colitis Started patient on oral vancomycin for 10 days Enteric precautions, monitor closely  Hypertension/bradycardia BP stable Hold home metoprolol  Dementia Continue Aricept   Code Status: Full  Family Communication: None at bedside  Disposition Plan: Home once diarrhea resolves   Consultants:  None  Procedures:  None  Antimicrobials:  Oral vancomycin  DVT prophylaxis: Lovenox   Objective: Vitals:   06/15/17 1722 06/15/17 2256 06/16/17 0611 06/16/17 1246  BP:  134/70 122/63 134/64  Pulse:  (!) 59 (!) 52 (!) 51  Resp:  _0 Temp:  97.9 F (36.6 C) 98.3 F (36.8 C) (!) 97.5 F (36.4 C)  TempSrc:  Oral  Oral Oral  SpO2:  98% 100% 100%  Weight: 73.3 kg (161 lb 9.6 oz)     Height: 5' 10" (1.778 m)       Intake/Output Summary (Last 24 hours) at 06/16/2017 1723 Last data filed at 06/16/2017 1220 Gross per 24 hour  Intake 2781.25 ml  Output 300 ml  Net 2481.25 ml   Filed Weights   06/15/17 1722  Weight: 73.3 kg (161 lb 9.6 oz)    Exam:   General: NAD  Cardiovascular: S1, S2 present  Respiratory: CTAB  Abdomen: Soft, nondistended, NT, bowel sounds present  Musculoskeletal: No pedal edema bilaterally  Skin: Normal  Psychiatry: Normal mood   Data Reviewed: CBC: Recent Labs  Lab 06/14/17 2356 06/15/17 1037 06/16/17 0504  WBC 6.1 9.1 10.9*  NEUTROABS 5.9  --  7.8*  HGB 15.8 15.8 15.1  HCT 47.4 47.7 46.4  MCV 93.3 94.5 92.8  PLT 127* 130* 95*   Basic Metabolic Panel: Recent Labs  Lab 06/14/17 2356 06/15/17 1037 06/16/17 0700  NA 140 146* 141  K 3.9 3.8 3.5  CL 112* 116* 114*  CO2 15* 21* 19*  GLUCOSE 201* 92 104*  BUN 24* 20 19  CREATININE 1.25* 1.14 1.07  CALCIUM 9.0 8.8* 8.3*   GFR: Estimated Creatinine Clearance: 56.9 mL/min (by C-G formula based on SCr of 1.07 mg/dL). Liver Function Tests: Recent Labs  Lab 06/14/17 2356 06/15/17 1037  AST 31 34  ALT 19 23  ALKPHOS 67 62  BILITOT 1.0 0.9  PROT 7.4 7.2  ALBUMIN 4.0 3.7   No results for input(s): LIPASE,  AMYLASE in the last 168 hours. No results for input(s): AMMONIA in the last 168 hours. Coagulation Profile: No results for input(s): INR, PROTIME in the last 168 hours. Cardiac Enzymes: Recent Labs  Lab 06/15/17 1037  TROPONINI 0.04*   BNP (last 3 results) No results for input(s): PROBNP in the last 8760 hours. HbA1C: Recent Labs    06/15/17 1037  HGBA1C 5.5   CBG: Recent Labs  Lab 06/15/17 1002 06/16/17 0837  GLUCAP 90 108*   Lipid Profile: No results for input(s): CHOL, HDL, LDLCALC, TRIG, CHOLHDL, LDLDIRECT in the last 72 hours. Thyroid Function Tests: Recent Labs     06/15/17 1037  TSH 2.198   Anemia Panel: No results for input(s): VITAMINB12, FOLATE, FERRITIN, TIBC, IRON, RETICCTPCT in the last 72 hours. Urine analysis:    Component Value Date/Time   COLORURINE YELLOW 06/15/2017 1528   APPEARANCEUR HAZY (A) 06/15/2017 1528   LABSPEC 1.026 06/15/2017 1528   PHURINE 5.0 06/15/2017 1528   GLUCOSEU NEGATIVE 06/15/2017 1528   HGBUR SMALL (A) 06/15/2017 1528   BILIRUBINUR NEGATIVE 06/15/2017 1528   KETONESUR NEGATIVE 06/15/2017 1528   PROTEINUR 30 (A) 06/15/2017 1528   NITRITE NEGATIVE 06/15/2017 1528   LEUKOCYTESUR NEGATIVE 06/15/2017 1528   Sepsis Labs: _0 (procalcitonin:4,lacticidven:4)  ) Recent Results (from the past 240 hour(s))  Gastrointestinal Panel by PCR , Stool     Status: Abnormal   Collection Time: 06/15/17  2:31 AM  Result Value Ref Range Status   Campylobacter species NOT DETECTED NOT DETECTED Final   Plesimonas shigelloides NOT DETECTED NOT DETECTED Final   Salmonella species NOT DETECTED NOT DETECTED Final   Yersinia enterocolitica NOT DETECTED NOT DETECTED Final   Vibrio species NOT DETECTED NOT DETECTED Final   Vibrio cholerae NOT DETECTED NOT DETECTED Final   Enteroaggregative E coli (EAEC) NOT DETECTED NOT DETECTED Final   Enteropathogenic E coli (EPEC) NOT DETECTED NOT DETECTED Final   Enterotoxigenic E coli (ETEC) NOT DETECTED NOT DETECTED Final   Shiga like toxin producing E coli (STEC) NOT DETECTED NOT DETECTED Final   Shigella/Enteroinvasive E coli (EIEC) NOT DETECTED NOT DETECTED Final   Cryptosporidium NOT DETECTED NOT DETECTED Final   Cyclospora cayetanensis NOT DETECTED NOT DETECTED Final   Entamoeba histolytica NOT DETECTED NOT DETECTED Final   Giardia lamblia NOT DETECTED NOT DETECTED Final   Adenovirus F40/41 NOT DETECTED NOT DETECTED Final   Astrovirus NOT DETECTED NOT DETECTED Final   Norovirus GI/GII DETECTED (A) NOT DETECTED Final    Comment: RESULT CALLED TO, READ BACK BY AND VERIFIED  WITH: JAZMYN ROBINSON @ 1826 ON 06/15/2017 BY CAF    Rotavirus A NOT DETECTED NOT DETECTED Final   Sapovirus (I, II, IV, and V) NOT DETECTED NOT DETECTED Final    Comment: Performed at [facility] [address] [place] [address] [place], [place] [address]  C difficile quick scan w PCR reflex     Status: Abnormal   Collection Time: 06/15/17  2:31 AM  Result Value Ref Range Status   C Diff antigen POSITIVE (A) NEGATIVE Final   C Diff toxin POSITIVE (A) NEGATIVE Final   C Diff interpretation Toxin producing C. difficile detected.  Final    Comment: CRITICAL RESULT CALLED TO, READ BACK BY AND VERIFIED WITH: L ADKINS,RN _1  06/15/17 MKELLY Performed at [facility], [place] [address], [place], [place] [address]       Studies: No results found.  Scheduled Meds: . aspirin EC  81 mg Oral Daily  . chlorhexidine  15 mL Mouth Rinse BID  . donepezil  5 mg Oral BID  . enoxaparin (LOVENOX) injection  40 mg Subcutaneous Q24H  . mouth rinse  15 mL Mouth Rinse q12n4p  . sertraline  100 mg Oral Daily  . sodium chloride flush  3 mL Intravenous Q12H  . vancomycin  125 mg Oral QID    Continuous Infusions:    LOS: 1 day     Alma Friendly, MD [facility]   If 7PM-7AM, please contact night-coverage [email or web address] Password [facility] 06/16/2017, 5:23 PM

## 2017-06-17 LAB — BASIC METABOLIC PANEL
Anion gap: 8 (ref 5–15)
BUN: 12 mg/dL (ref 6–20)
CHLORIDE: 112 mmol/L — AB (ref 101–111)
CO2: 22 mmol/L (ref 22–32)
Calcium: 8.4 mg/dL — ABNORMAL LOW (ref 8.9–10.3)
Creatinine, Ser: 1.04 mg/dL (ref 0.61–1.24)
GFR calc non Af Amer: >60 mL/min (ref >60)
Glucose, Bld: 103 mg/dL — ABNORMAL HIGH (ref 65–99)
POTASSIUM: 3.2 mmol/L — AB (ref 3.5–5.1)
SODIUM: 142 mmol/L (ref 135–145)

## 2017-06-17 LAB — CBC WITH DIFFERENTIAL/PLATELET
Basophils Absolute: 0 10*3/uL (ref 0.0–0.1)
Basophils Relative: 0 %
EOS ABS: 0.2 10*3/uL (ref 0.0–0.7)
Eosinophils Relative: 2 %
HCT: 42.2 % (ref 39.0–52.0)
HEMOGLOBIN: 13.9 g/dL (ref 13.0–17.0)
LYMPHS ABS: 0.9 10*3/uL (ref 0.7–4.0)
Lymphocytes Relative: 13 %
MCH: 30.6 pg (ref 26.0–34.0)
MCHC: 32.9 g/dL (ref 30.0–36.0)
MCV: 93 fL (ref 78.0–100.0)
Monocytes Absolute: 0.8 10*3/uL (ref 0.1–1.0)
Monocytes Relative: 12 %
NEUTROS PCT: 73 %
Neutro Abs: 5.4 10*3/uL (ref 1.7–7.7)
Platelets: 121 10*3/uL — ABNORMAL LOW (ref 150–400)
RBC: 4.54 MIL/uL (ref 4.22–5.81)
RDW: 14.3 % (ref 11.5–15.5)
WBC: 7.3 10*3/uL (ref 4.0–10.5)

## 2017-06-17 LAB — GLUCOSE, CAPILLARY: GLUCOSE-CAPILLARY: 85 mg/dL (ref 65–99)

## 2017-06-17 MED ORDER — POTASSIUM CHLORIDE CRYS ER 20 MEQ PO TBCR
40.0000 meq | EXTENDED_RELEASE_TABLET | Freq: Once | ORAL | Status: AC
Start: 2017-06-17 — End: 2017-06-17
  Administered 2017-06-17: 40 meq via ORAL
  Filled 2017-06-17: qty 2

## 2017-06-17 MED ORDER — ALPRAZOLAM 0.25 MG PO TABS
0.2500 mg | ORAL_TABLET | Freq: Once | ORAL | Status: AC
  Administered 2017-06-17: 0.25 mg via ORAL
  Filled 2017-06-17: qty 1

## 2017-06-17 NOTE — Progress Notes (Signed)
PROGRESS NOTE  Joel Ibarra LKG:[id number] DOB: [birth year] DOA: 06/14/2017 PCP: System, Pcp Not In  HPI/Recap of past 24 hours: Joel Ibarra  is a 83 y.o. male, with past medical history significant for dementia, hypertension presents to the ED complaining of watery diarrhea with multiple episodes per day for the past 2 days prior to admission.  Patient reports associated mild lower abdominal cramping, and had one episode of nausea and vomiting in the ED.  Patient denies any fever/chills.  Of note, his wife was admitted for diarrhea on Monday here at [facility].  Patient was found to be positive for C. Difficile.  Patient admitted for further management.   Today, patient still having watery diarrhea, about 4 episodes. Denies any abdominal pain, no further vomiting.  No fever noted.   Assessment/Plan: Principal Problem:   Diarrhea Active Problems:   Nausea & vomiting   Tachycardia  C. difficile colitis Afebrile, resolved leukocytosis Still having watery diarrhea, reduced in episodes C. difficile toxin positive stool, stool also positive for norovirus CT scan of the abdomen showed mild wall thickening and stranding around the descending colon concerning for early infectious or ischemic colitis Started patient on oral vancomycin for 10 days Enteric precautions, monitor closely  Hypertension/bradycardia BP stable Hold home metoprolol  Dementia Continue Aricept   Code Status: Full  Family Communication: None at bedside  Disposition Plan: Home once diarrhea resolves   Consultants:  None  Procedures:  None  Antimicrobials:  Oral vancomycin  DVT prophylaxis: Lovenox   Objective: Vitals:   06/16/17 2049 06/17/17 0500 06/17/17 0611 06/17/17 1334  BP: 129/66  138/71 137/62  Pulse: (!) 58  69 (!) 59  Resp: 19  15 18   Temp: 97.8 F (36.6 C)  97.9 F (36.6 C) 97.7 F (36.5 C)  TempSrc: Oral  Oral Oral  SpO2: 99%  98% 98%  Weight:  72.9 kg  (160 lb 11.5 oz)    Height:        Intake/Output Summary (Last 24 hours) at 06/17/2017 1511 Last data filed at 06/17/2017 1345 Gross per 24 hour  Intake 420 ml  Output 1400 ml  Net -980 ml   Filed Weights   06/15/17 1722 06/17/17 0500  Weight: 73.3 kg (161 lb 9.6 oz) 72.9 kg (160 lb 11.5 oz)    Exam:   General: NAD  Cardiovascular: S1, S2 present  Respiratory: CTAB  Abdomen: Soft, nondistended, NT, bowel sounds present  Musculoskeletal: No pedal edema bilaterally  Skin: Normal  Psychiatry: Normal mood   Data Reviewed: CBC: Recent Labs  Lab 06/14/17 2356 06/15/17 1037 06/16/17 0504 06/17/17 0502  WBC 6.1 9.1 10.9* 7.3  NEUTROABS 5.9  --  7.8* 5.4  HGB 15.8 15.8 15.1 13.9  HCT 47.4 47.7 46.4 42.2  MCV 93.3 94.5 92.8 93.0  PLT 127* 130* 95* 664*   Basic Metabolic Panel: Recent Labs  Lab 06/14/17 2356 06/15/17 1037 06/16/17 0700 06/17/17 0502  NA 140 146* 141 142  K 3.9 3.8 3.5 3.2*  CL 112* 116* 114* 112*  CO2 15* 21* 19* 22  GLUCOSE 201* 92 104* 103*  BUN 24* 20 19 12   CREATININE 1.25* 1.14 1.07 1.04  CALCIUM 9.0 8.8* 8.3* 8.4*   GFR: Estimated Creatinine Clearance: 58.4 mL/min (by C-G formula based on SCr of 1.04 mg/dL). Liver Function Tests: Recent Labs  Lab 06/14/17 2356 06/15/17 1037  AST 31 34  ALT 19 23  ALKPHOS 67 62  BILITOT 1.0 0.9  PROT 7.4  7.2  ALBUMIN 4.0 3.7   No results for input(s): LIPASE, AMYLASE in the last 168 hours. No results for input(s): AMMONIA in the last 168 hours. Coagulation Profile: No results for input(s): INR, PROTIME in the last 168 hours. Cardiac Enzymes: Recent Labs  Lab 06/15/17 1037  TROPONINI 0.04*   BNP (last 3 results) No results for input(s): PROBNP in the last 8760 hours. HbA1C: Recent Labs    06/15/17 1037  HGBA1C 5.5   CBG: Recent Labs  Lab 06/15/17 1002 06/16/17 0837 06/17/17 0755  GLUCAP 90 108* 85   Lipid Profile: No results for input(s): CHOL, HDL, LDLCALC, TRIG,  CHOLHDL, LDLDIRECT in the last 72 hours. Thyroid Function Tests: Recent Labs    06/15/17 1037  TSH 2.198   Anemia Panel: No results for input(s): VITAMINB12, FOLATE, FERRITIN, TIBC, IRON, RETICCTPCT in the last 72 hours. Urine analysis:    Component Value Date/Time   COLORURINE YELLOW 06/15/2017 1528   APPEARANCEUR HAZY (A) 06/15/2017 1528   LABSPEC 1.026 06/15/2017 1528   PHURINE 5.0 06/15/2017 1528   GLUCOSEU NEGATIVE 06/15/2017 1528   HGBUR SMALL (A) 06/15/2017 1528   BILIRUBINUR NEGATIVE 06/15/2017 1528   KETONESUR NEGATIVE 06/15/2017 1528   PROTEINUR 30 (A) 06/15/2017 1528   NITRITE NEGATIVE 06/15/2017 1528   LEUKOCYTESUR NEGATIVE 06/15/2017 1528   Sepsis Labs: @LABRCNTIP (procalcitonin:4,lacticidven:4)  ) Recent Results (from the past 240 hour(s))  Gastrointestinal Panel by PCR , Stool     Status: Abnormal   Collection Time: 06/15/17  2:31 AM  Result Value Ref Range Status   Campylobacter species NOT DETECTED NOT DETECTED Final   Plesimonas shigelloides NOT DETECTED NOT DETECTED Final   Salmonella species NOT DETECTED NOT DETECTED Final   Yersinia enterocolitica NOT DETECTED NOT DETECTED Final   Vibrio species NOT DETECTED NOT DETECTED Final   Vibrio cholerae NOT DETECTED NOT DETECTED Final   Enteroaggregative E coli (EAEC) NOT DETECTED NOT DETECTED Final   Enteropathogenic E coli (EPEC) NOT DETECTED NOT DETECTED Final   Enterotoxigenic E coli (ETEC) NOT DETECTED NOT DETECTED Final   Shiga like toxin producing E coli (STEC) NOT DETECTED NOT DETECTED Final   Shigella/Enteroinvasive E coli (EIEC) NOT DETECTED NOT DETECTED Final   Cryptosporidium NOT DETECTED NOT DETECTED Final   Cyclospora cayetanensis NOT DETECTED NOT DETECTED Final   Entamoeba histolytica NOT DETECTED NOT DETECTED Final   Giardia lamblia NOT DETECTED NOT DETECTED Final   Adenovirus F40/41 NOT DETECTED NOT DETECTED Final   Astrovirus NOT DETECTED NOT DETECTED Final   Norovirus GI/GII DETECTED (A)  NOT DETECTED Final    Comment: RESULT CALLED TO, READ BACK BY AND VERIFIED WITH: JAZMYN ROBINSON @ 1826 ON 06/15/2017 BY CAF    Rotavirus A NOT DETECTED NOT DETECTED Final   Sapovirus (I, II, IV, and V) NOT DETECTED NOT DETECTED Final    Comment: Performed at [facility], [address] [place], [place] [address]  C difficile quick scan w PCR reflex     Status: Abnormal   Collection Time: 06/15/17  2:31 AM  Result Value Ref Range Status   C Diff antigen POSITIVE (A) NEGATIVE Final   C Diff toxin POSITIVE (A) NEGATIVE Final   C Diff interpretation Toxin producing C. difficile detected.  Final    Comment: CRITICAL RESULT CALLED TO, READ BACK BY AND VERIFIED WITH: L ADKINS,RN @0323  06/15/17 MKELLY Performed at [facility], [place] [address] [place], [place] [address]       Studies: No results  found.  Scheduled Meds: . ALPRAZolam  0.25 mg Oral Once  . aspirin EC  81 mg Oral Daily  . chlorhexidine  15 mL Mouth Rinse BID  . donepezil  5 mg Oral BID  . enoxaparin (LOVENOX) injection  40 mg Subcutaneous Q24H  . mouth rinse  15 mL Mouth Rinse q12n4p  . sertraline  100 mg Oral Daily  . sodium chloride flush  3 mL Intravenous Q12H  . vancomycin  125 mg Oral QID    Continuous Infusions:    LOS: 2 days     Alma Friendly, MD [facility]   If 7PM-7AM, please contact night-coverage [email or web address] Password [email or web address] 06/17/2017, 3:11 PM

## 2017-06-17 NOTE — Evaluation (Signed)
Physical Therapy Evaluation Patient Details Name: Joel Ibarra MRN: [id number] DOB: 1934/03/17 Today's Date: 06/17/2017   History of Present Illness  Symir Mah  is a 83 y.o. male, with past medical history significant for dementia, hypertension presents to the ED complaining of watery diarrhea, positive for C-diff.  Wife inpatient as well with similar complaints.  Clinical Impression  Patient presents with decreased independence with mobility due to generalized weakness/deconditioning from illness and hospitalization.  Currently min A for mobility in the room only and usually independent with walker at home.  Limited ambulation due to continued diarrhea.  Feel he will need SNF level rehab at d/c as wife also inpatient and unable to assist for his memory issues.  PT to follow acutely.    Follow Up Recommendations SNF;Supervision/Assistance - 24 hour(at [facility])    Equipment Recommendations  None recommended by PT    Recommendations for Other Services       Precautions / Restrictions Precautions Precautions: Fall      Mobility  Bed Mobility Overal bed mobility: Needs Assistance Bed Mobility: Supine to Sit;Sit to Supine     Supine to sit: Min assist;HOB elevated Sit to supine: Supervision   General bed mobility comments: lifting assist for trunk  Transfers Overall transfer level: Needs assistance Equipment used: Rolling walker (2 wheeled) Transfers: Sit to/from Stand Sit to Stand: Min assist         General transfer comment: cues for hand placement up from bed and low toilet  Ambulation/Gait Ambulation/Gait assistance: Min guard;Min assist Ambulation Distance (Feet): 12 Feet(x 2 to and from bathroom) Assistive device: Rolling walker (2 wheeled) Gait Pattern/deviations: Step-through pattern;Decreased stride length;Trunk flexed     General Gait Details: limited due to continued diarrhea, rushing to get to toilet in time, but did not stumble, minguard to  min a for safety  Stairs            Wheelchair Mobility    Modified Rankin (Stroke Patients Only)       Balance Overall balance assessment: Needs assistance   Sitting balance-Leahy Scale: Good Sitting balance - Comments: donned R sock in sitting with effort     Standing balance-Leahy Scale: Poor Standing balance comment: held walker while PT assisted with hygiene                             Pertinent Vitals/Pain Pain Assessment: No/denies pain    Home Living Family/patient expects to be discharged to:: Private residence Living Arrangements: Spouse/significant other(who is inpatient) Available Help at Discharge: Family Type of Home: Independent living facility([facility]) Home Access: Elevator     Home Layout: One level Home Equipment: Environmental consultant - 2 wheels;Walker - 4 wheels Additional Comments: uses 2 wheeled walker    Prior Function Level of Independence: Independent with assistive device(s)         Comments: sponge bathes, gets meals from facility     Hand Dominance        Extremity/Trunk Assessment   Upper Extremity Assessment Upper Extremity Assessment: Generalized weakness    Lower Extremity Assessment Lower Extremity Assessment: Generalized weakness    Cervical / Trunk Assessment Cervical / Trunk Assessment: Kyphotic  Communication   Communication: No difficulties  Cognition Arousal/Alertness: Awake/alert Behavior During Therapy: WFL for tasks assessed/performed Overall Cognitive Status: History of cognitive impairments - at baseline  General Comments      Exercises     Assessment/Plan    PT Assessment Patient needs continued PT services  PT Problem List Decreased strength;Decreased mobility;Decreased activity tolerance;Decreased balance;Decreased knowledge of use of DME;Decreased safety awareness       PT Treatment Interventions DME instruction;Therapeutic  activities;Therapeutic exercise;Patient/family education;Gait training;Balance training;Functional mobility training    PT Goals (Current goals can be found in the Care Plan section)  Acute Rehab PT Goals Patient Stated Goal: To get stronger PT Goal Formulation: With patient Time For Goal Achievement: 06/24/17 Potential to Achieve Goals: Good    Frequency Min 3X/week   Barriers to discharge        Co-evaluation               AM-PAC PT "6 Clicks" Daily Activity  Outcome Measure Difficulty turning over in bed (including adjusting bedclothes, sheets and blankets)?: A Little Difficulty moving from lying on back to sitting on the side of the bed? : Unable Difficulty sitting down on and standing up from a chair with arms (e.g., wheelchair, bedside commode, etc,.)?: Unable Help needed moving to and from a bed to chair (including a wheelchair)?: A Little Help needed walking in hospital room?: A Little Help needed climbing 3-5 steps with a railing? : A Lot 6 Click Score: 13    End of Session Equipment Utilized During Treatment: Gait belt Activity Tolerance: Patient tolerated treatment well;Treatment limited secondary to medical complications (Comment)(diarrhea) Patient left: in bed;with call bell/phone within reach;with bed alarm set   PT Visit Diagnosis: Other abnormalities of gait and mobility (R26.89);Muscle weakness (generalized) (M62.81)    Time: 1610-1640 PT Time Calculation (min) (ACUTE ONLY): 30 min   Charges:   PT Evaluation $PT Eval Moderate Complexity: 1 Mod PT Treatments $Gait Training: 8-22 mins   PT G CodesMagda Kiel, [place] [phone] 06/17/2017   Reginia Naas 06/17/2017, 4:47 PM

## 2017-06-18 DIAGNOSIS — A0811 Acute gastroenteropathy due to Norwalk agent: Secondary | ICD-10-CM

## 2017-06-18 DIAGNOSIS — A09 Infectious gastroenteritis and colitis, unspecified: Secondary | ICD-10-CM

## 2017-06-18 LAB — BASIC METABOLIC PANEL
Anion gap: 7 (ref 5–15)
BUN: 11 mg/dL (ref 6–20)
CALCIUM: 8.4 mg/dL — AB (ref 8.9–10.3)
CHLORIDE: 111 mmol/L (ref 101–111)
CO2: 23 mmol/L (ref 22–32)
CREATININE: 0.91 mg/dL (ref 0.61–1.24)
GFR calc non Af Amer: >60 mL/min (ref >60)
GLUCOSE: 107 mg/dL — AB (ref 65–99)
Potassium: 3.4 mmol/L — ABNORMAL LOW (ref 3.5–5.1)
Sodium: 141 mmol/L (ref 135–145)

## 2017-06-18 LAB — CBC WITH DIFFERENTIAL/PLATELET
BASOS PCT: 0 %
Basophils Absolute: 0 10*3/uL (ref 0.0–0.1)
Eosinophils Absolute: 0.2 10*3/uL (ref 0.0–0.7)
Eosinophils Relative: 3 %
HCT: 40.4 % (ref 39.0–52.0)
HEMOGLOBIN: 13.5 g/dL (ref 13.0–17.0)
LYMPHS ABS: 1 10*3/uL (ref 0.7–4.0)
LYMPHS PCT: 14 %
MCH: 30.5 pg (ref 26.0–34.0)
MCHC: 33.4 g/dL (ref 30.0–36.0)
MCV: 91.2 fL (ref 78.0–100.0)
MONO ABS: 0.8 10*3/uL (ref 0.1–1.0)
MONOS PCT: 11 %
NEUTROS ABS: 5.1 10*3/uL (ref 1.7–7.7)
NEUTROS PCT: 72 %
Platelets: 131 10*3/uL — ABNORMAL LOW (ref 150–400)
RBC: 4.43 MIL/uL (ref 4.22–5.81)
RDW: 14 % (ref 11.5–15.5)
WBC: 7.1 10*3/uL (ref 4.0–10.5)

## 2017-06-18 LAB — GLUCOSE, CAPILLARY: Glucose-Capillary: 87 mg/dL (ref 65–99)

## 2017-06-18 NOTE — NC FL2 (Signed)
[place] LEVEL OF CARE SCREENING TOOL     IDENTIFICATION  Patient Name: Joel Ibarra Birthdate: September 01, 1934 Sex: male Admission Date (Current Location): 06/14/2017  [facility] and [place] Number:  Herbalist and Address:  The . [facility], [place] [address] [place], [place] [address]      Provider Number: [id number]  Attending Physician Name and Address:  Alma Friendly, MD  Relative Name and Phone Number:  Taevion Sikora, spouse, [phone], daughter-Jennifer [phone]    Current Level of Care: Hospital Recommended Level of Care: [facility] Prior Approval Number:    Date Approved/Denied:   PASRR Number: [id number]  Discharge Plan: SNF    Current Diagnoses: Patient Active Problem List   Diagnosis Date Noted  . Diarrhea 06/15/2017  . Nausea & vomiting 06/15/2017  . Tachycardia 06/15/2017    Orientation RESPIRATION BLADDER Height & Weight     Self, Time, Situation, Place  Normal Incontinent Weight: 162 lb 14.7 oz (73.9 kg) Height:  5\' 10"  (177.8 cm)  BEHAVIORAL SYMPTOMS/MOOD NEUROLOGICAL BOWEL NUTRITION STATUS    (Memory) Continent    AMBULATORY STATUS COMMUNICATION OF NEEDS Skin   Limited Assist Verbally Normal                       Personal Care Assistance Level of Assistance  Dressing, Bathing, Feeding Bathing Assistance: Limited assistance Feeding assistance: Limited assistance Dressing Assistance: Limited assistance     Functional Limitations Info  Sight, Hearing, Speech Sight Info: Adequate Hearing Info: Adequate Speech Info: Adequate    SPECIAL CARE FACTORS FREQUENCY  PT (By licensed PT), OT (By licensed OT)     PT Frequency: 3x week OT Frequency: 3x week            Contractures      Additional Factors Info  Isolation Precautions, Allergies, Code Status Code Status Info: Full  Allergies Info: BEE VENOM, CODEINE, DAPTOMYCIN, CLINDAMYCIN/LINCOMYCIN, LINCOMYCIN,  LACTOSE      Isolation Precautions Info: Enteric Precautions     Current Medications (06/18/2017):  This is the current hospital active medication list Current Facility-Administered Medications  Medication Dose Route Frequency Provider Last Rate Last Dose  . acetaminophen (TYLENOL) tablet 650 mg  650 mg Oral Q6H PRN Jani Gravel, MD       Or  . acetaminophen (TYLENOL) suppository 650 mg  650 mg Rectal Q6H PRN Jani Gravel, MD      . aspirin EC tablet 81 mg  81 mg Oral Daily Jani Gravel, MD   81 mg at 06/18/17 1001  . chlorhexidine (PERIDEX) 0.12 % solution 15 mL  15 mL Mouth Rinse BID Alma Friendly, MD   15 mL at 06/18/17 1002  . donepezil (ARICEPT) tablet 5 mg  5 mg Oral BID Jani Gravel, MD   5 mg at 06/18/17 1001  . enoxaparin (LOVENOX) injection 40 mg  40 mg Subcutaneous Q24H Alma Friendly, MD   40 mg at 06/18/17 1001  . LORazepam (ATIVAN) injection 0.5 mg  0.5 mg Intravenous Daily PRN Jani Gravel, MD   0.5 mg at 06/15/17 2001  . MEDLINE mouth rinse  15 mL Mouth Rinse q12n4p Alma Friendly, MD   15 mL at 06/16/17 1550  . ondansetron (ZOFRAN) tablet 4 mg  4 mg Oral Q6H PRN Alma Friendly, MD       Or  . ondansetron [facility]) injection 4 mg  4 mg Intravenous Q6H PRN Alma Friendly, MD      .  sertraline (ZOLOFT) tablet 100 mg  100 mg Oral Daily Jani Gravel, MD   100 mg at 06/18/17 1001  . sodium chloride flush (NS) 0.9 % injection 3 mL  3 mL Intravenous Q12H Alma Friendly, MD   3 mL at 06/18/17 1004  . traZODone (DESYREL) tablet 25 mg  25 mg Oral QHS PRN Alma Friendly, MD   25 mg at 06/17/17 2147  . vancomycin (VANCOCIN) 50 mg/mL oral solution 125 mg  125 mg Oral QID Alma Friendly, MD   125 mg at 06/18/17 1431     Discharge Medications: Please see discharge summary for a list of discharge medications.  Relevant Imaging Results:  Relevant Lab Results:   Additional Information SS#: [place], LCSW

## 2017-06-18 NOTE — Progress Notes (Signed)
PROGRESS NOTE  Joel Ibarra GYI:[id number] DOB: 1934/06/28 DOA: 06/14/2017 PCP: System, Pcp Not In  HPI/Recap of past 24 hours: Joel Ibarra  is a 83 y.o. male, with past medical history significant for dementia, hypertension presents to the ED complaining of watery diarrhea with multiple episodes per day for the past 2 days prior to admission.  Patient reports associated mild lower abdominal cramping, and had one episode of nausea and vomiting in the ED.  Patient denies any fever/chills.  Of note, his wife was admitted for diarrhea on Monday here at [facility].  Patient was found to be positive for C. Difficile.  Patient admitted for further management.   Today, patient still having watery diarrhea, about 2 episodes, resolving. Denies any abdominal pain, no further vomiting.  No fever noted.   Assessment/Plan: Principal Problem:   Diarrhea Active Problems:   Nausea & vomiting   Tachycardia  C. difficile colitis + norovirus gastroenteritis Afebrile, resolved leukocytosis Still having watery diarrhea, reduced in episodes C. difficile toxin positive stool, stool also positive for norovirus CT scan of the abdomen showed mild wall thickening and stranding around the descending colon concerning for early infectious or ischemic colitis Started patient on oral vancomycin for 10 days Enteric precautions, monitor closely  Hypertension/bradycardia BP stable Hold home metoprolol  Dementia Continue Aricept   Code Status: Full  Family Communication: None at bedside  Disposition Plan: SNF once diarrhea resolves   Consultants:  None  Procedures:  None  Antimicrobials:  Oral vancomycin  DVT prophylaxis: Lovenox   Objective: Vitals:   06/17/17 2106 06/18/17 0612 06/18/17 0719 06/18/17 1333  BP: 139/69 134/70  116/67  Pulse: (!) 59 (!) 54  63  Resp: 18 16  16   Temp:  98 F (36.7 C)  97.7 F (36.5 C)  TempSrc:  Oral  Oral  SpO2: 98% 99%  98%    Weight:   73.9 kg (162 lb 14.7 oz)   Height:        Intake/Output Summary (Last 24 hours) at 06/18/2017 1502 Last data filed at 06/18/2017 1200 Gross per 24 hour  Intake 240 ml  Output 1201 ml  Net -961 ml   Filed Weights   06/15/17 1722 06/17/17 0500 06/18/17 0719  Weight: 73.3 kg (161 lb 9.6 oz) 72.9 kg (160 lb 11.5 oz) 73.9 kg (162 lb 14.7 oz)    Exam:   General: NAD  Cardiovascular: S1, S2 present  Respiratory: CTAB  Abdomen: Soft, nondistended, NT, bowel sounds present  Musculoskeletal: No pedal edema bilaterally  Skin: Normal  Psychiatry: Normal mood   Data Reviewed: CBC: Recent Labs  Lab 06/14/17 2356 06/15/17 1037 06/16/17 0504 06/17/17 0502 06/18/17 0506  WBC 6.1 9.1 10.9* 7.3 7.1  NEUTROABS 5.9  --  7.8* 5.4 5.1  HGB 15.8 15.8 15.1 13.9 13.5  HCT 47.4 47.7 46.4 42.2 40.4  MCV 93.3 94.5 92.8 93.0 91.2  PLT 127* 130* 95* 121* 350*   Basic Metabolic Panel: Recent Labs  Lab 06/14/17 2356 06/15/17 1037 06/16/17 0700 06/17/17 0502 06/18/17 0506  NA 140 146* 141 142 141  K 3.9 3.8 3.5 3.2* 3.4*  CL 112* 116* 114* 112* 111  CO2 15* 21* 19* 22 23  GLUCOSE 201* 92 104* 103* 107*  BUN 24* 20 19 12 11   CREATININE 1.25* 1.14 1.07 1.04 0.91  CALCIUM 9.0 8.8* 8.3* 8.4* 8.4*   GFR: Estimated Creatinine Clearance: 66.8 mL/min (by C-G formula based on SCr of 0.91 mg/dL). Liver Function  Tests: Recent Labs  Lab 06/14/17 2356 06/15/17 1037  AST 31 34  ALT 19 23  ALKPHOS 67 62  BILITOT 1.0 0.9  PROT 7.4 7.2  ALBUMIN 4.0 3.7   No results for input(s): LIPASE, AMYLASE in the last 168 hours. No results for input(s): AMMONIA in the last 168 hours. Coagulation Profile: No results for input(s): INR, PROTIME in the last 168 hours. Cardiac Enzymes: Recent Labs  Lab 06/15/17 1037  TROPONINI 0.04*   BNP (last 3 results) No results for input(s): PROBNP in the last 8760 hours. HbA1C: No results for input(s): HGBA1C in the last 72  hours. CBG: Recent Labs  Lab 06/15/17 1002 06/16/17 0837 06/17/17 0755 06/18/17 0717  GLUCAP 90 108* 85 87   Lipid Profile: No results for input(s): CHOL, HDL, LDLCALC, TRIG, CHOLHDL, LDLDIRECT in the last 72 hours. Thyroid Function Tests: No results for input(s): TSH, T4TOTAL, FREET4, T3FREE, THYROIDAB in the last 72 hours. Anemia Panel: No results for input(s): VITAMINB12, FOLATE, FERRITIN, TIBC, IRON, RETICCTPCT in the last 72 hours. Urine analysis:    Component Value Date/Time   COLORURINE YELLOW 06/15/2017 1528   APPEARANCEUR HAZY (A) 06/15/2017 1528   LABSPEC 1.026 06/15/2017 1528   PHURINE 5.0 06/15/2017 1528   GLUCOSEU NEGATIVE 06/15/2017 1528   HGBUR SMALL (A) 06/15/2017 1528   BILIRUBINUR NEGATIVE 06/15/2017 1528   KETONESUR NEGATIVE 06/15/2017 1528   PROTEINUR 30 (A) 06/15/2017 1528   NITRITE NEGATIVE 06/15/2017 1528   LEUKOCYTESUR NEGATIVE 06/15/2017 1528   Sepsis Labs: @LABRCNTIP (procalcitonin:4,lacticidven:4)  ) Recent Results (from the past 240 hour(s))  Gastrointestinal Panel by PCR , Stool     Status: Abnormal   Collection Time: 06/15/17  2:31 AM  Result Value Ref Range Status   Campylobacter species NOT DETECTED NOT DETECTED Final   Plesimonas shigelloides NOT DETECTED NOT DETECTED Final   Salmonella species NOT DETECTED NOT DETECTED Final   Yersinia enterocolitica NOT DETECTED NOT DETECTED Final   Vibrio species NOT DETECTED NOT DETECTED Final   Vibrio cholerae NOT DETECTED NOT DETECTED Final   Enteroaggregative E coli (EAEC) NOT DETECTED NOT DETECTED Final   Enteropathogenic E coli (EPEC) NOT DETECTED NOT DETECTED Final   Enterotoxigenic E coli (ETEC) NOT DETECTED NOT DETECTED Final   Shiga like toxin producing E coli (STEC) NOT DETECTED NOT DETECTED Final   Shigella/Enteroinvasive E coli (EIEC) NOT DETECTED NOT DETECTED Final   Cryptosporidium NOT DETECTED NOT DETECTED Final   Cyclospora cayetanensis NOT DETECTED NOT DETECTED Final   Entamoeba  histolytica NOT DETECTED NOT DETECTED Final   Giardia lamblia NOT DETECTED NOT DETECTED Final   Adenovirus F40/41 NOT DETECTED NOT DETECTED Final   Astrovirus NOT DETECTED NOT DETECTED Final   Norovirus GI/GII DETECTED (A) NOT DETECTED Final    Comment: RESULT CALLED TO, READ BACK BY AND VERIFIED WITH: JAZMYN ROBINSON @ 1826 ON 06/15/2017 BY CAF    Rotavirus A NOT DETECTED NOT DETECTED Final   Sapovirus (I, II, IV, and V) NOT DETECTED NOT DETECTED Final    Comment: Performed at [facility], [address] [place], [place] [address]  C difficile quick scan w PCR reflex     Status: Abnormal   Collection Time: 06/15/17  2:31 AM  Result Value Ref Range Status   C Diff antigen POSITIVE (A) NEGATIVE Final   C Diff toxin POSITIVE (A) NEGATIVE Final   C Diff interpretation Toxin producing C. difficile detected.  Final    Comment: CRITICAL RESULT CALLED TO, READ BACK  BY AND VERIFIED WITH: L ADKINS,RN @0323  06/15/17 MKELLY Performed at [facility], [place] [address] [place], [place] [address]       Studies: No results found.  Scheduled Meds: . aspirin EC  81 mg Oral Daily  . chlorhexidine  15 mL Mouth Rinse BID  . donepezil  5 mg Oral BID  . enoxaparin (LOVENOX) injection  40 mg Subcutaneous Q24H  . mouth rinse  15 mL Mouth Rinse q12n4p  . sertraline  100 mg Oral Daily  . sodium chloride flush  3 mL Intravenous Q12H  . vancomycin  125 mg Oral QID    Continuous Infusions:    LOS: 3 days     Alma Friendly, MD [facility]   If 7PM-7AM, please contact night-coverage [email or web address] Password [facility] 06/18/2017, 3:02 PM

## 2017-06-19 LAB — CBC WITH DIFFERENTIAL/PLATELET
Basophils Absolute: 0 10*3/uL (ref 0.0–0.1)
Basophils Relative: 0 %
Eosinophils Absolute: 0.2 10*3/uL (ref 0.0–0.7)
Eosinophils Relative: 3 %
HEMATOCRIT: 38.4 % — AB (ref 39.0–52.0)
Hemoglobin: 12.8 g/dL — ABNORMAL LOW (ref 13.0–17.0)
Lymphocytes Relative: 19 %
Lymphs Abs: 1.4 10*3/uL (ref 0.7–4.0)
MCH: 30.5 pg (ref 26.0–34.0)
MCHC: 33.3 g/dL (ref 30.0–36.0)
MCV: 91.4 fL (ref 78.0–100.0)
MONO ABS: 1.1 10*3/uL — AB (ref 0.1–1.0)
MONOS PCT: 14 %
NEUTROS ABS: 4.8 10*3/uL (ref 1.7–7.7)
Neutrophils Relative %: 64 %
Platelets: 155 10*3/uL (ref 150–400)
RBC: 4.2 MIL/uL — ABNORMAL LOW (ref 4.22–5.81)
RDW: 13.9 % (ref 11.5–15.5)
WBC: 7.5 10*3/uL (ref 4.0–10.5)

## 2017-06-19 LAB — BASIC METABOLIC PANEL
ANION GAP: 8 (ref 5–15)
BUN: 11 mg/dL (ref 6–20)
CALCIUM: 8.3 mg/dL — AB (ref 8.9–10.3)
CO2: 23 mmol/L (ref 22–32)
CREATININE: 1.12 mg/dL (ref 0.61–1.24)
Chloride: 108 mmol/L (ref 101–111)
GFR calc Af Amer: >60 mL/min (ref >60)
GFR calc non Af Amer: >60 mL/min (ref >60)
GLUCOSE: 99 mg/dL (ref 65–99)
Potassium: 3.2 mmol/L — ABNORMAL LOW (ref 3.5–5.1)
Sodium: 139 mmol/L (ref 135–145)

## 2017-06-19 MED ORDER — POTASSIUM CHLORIDE CRYS ER 20 MEQ PO TBCR
40.0000 meq | EXTENDED_RELEASE_TABLET | Freq: Two times a day (BID) | ORAL | Status: AC
  Administered 2017-06-19 (×2): 40 meq via ORAL
  Filled 2017-06-19 (×2): qty 2

## 2017-06-19 NOTE — Progress Notes (Signed)
Assuming care of patient, agree with previous nursing assessment

## 2017-06-19 NOTE — Clinical Social Work Note (Signed)
Clinical Social Work Assessment  Patient Details  Name: Joel Ibarra MRN: [id number] Date of Birth: 30-Sep-1934  Date of referral:  06/19/17               Reason for consult:  Facility Placement                Permission sought to share information with:  Family Supports Permission granted to share information::  Yes, Verbal Permission Granted  Name::     [name]  Agency::  [facility]  Relationship::     Contact Information:     Housing/Transportation Living arrangements for the past 2 months:  [facility] Patient Interpreter Needed:  None Criminal Activity/Legal Involvement Pertinent to Current Situation/Hospitalization:  No - Comment as needed Significant Relationships:  Adult Ibarra, Spouse Lives with:  Self, Facility Resident Do you feel safe going back to the place where you live?  Yes Need for family participation in patient care:  Yes (Comment)(daughter and wife (also admitted inpatient) assist)  Care giving concerns:  Pt lives in independent living apartment with his wife at [facility]. PTA wife assisted with managing affairs as pt has dementia but he was fairly independent as far as ambulation and ADLs. Currently admitted for diarrhea, nausea/vomiting, tachycardia. Of note, pt's wife also admitted to [facility] for similar issues.    Social Worker assessment / plan:  CSW consulted to assist with SNF placement. Met with pt and daughter at bedside. Both understanding of SNF recommendation as pt has been for rehab in the past. Pt lives at [facility] and plans to go to rehab section upon DC. CSW provided FL2 via the [facility], spoke with [facility] SNF admissions who is aware of pt's need and expecting him to admit at DC.  Plan: [facility] for [place] rehab at DC.   Employment status:  Retired Forensic scientist:  [employer] PT Recommendations:  Joel [facility] / Referral to community  resources:  Joel Ibarra  Patient/Family's Response to care:  appreciative  Patient/Family's Understanding of and Emotional Response to Diagnosis, Current Treatment, and Prognosis:  Pt and daughter demonstrate good understanding of pt's treatment, describing hospital course. Pt and daughter emotionally positive about plan- "This will be really good for me to get stronger."  Emotional Assessment Appearance:  Appears stated age Attitude/Demeanor/Rapport:  Engaged Affect (typically observed):  Accepting, Adaptable, Pleasant Orientation:  Oriented to Place, Oriented to Self, Oriented to Situation Alcohol / Substance use:  Not Applicable Psych involvement (Current and /or in the community):  No (Comment)  Discharge Needs  Concerns to be addressed:  Care Coordination Readmission within the last 30 days:  No Current discharge risk:  None Barriers to Discharge:  Continued Medical Work up   [employer], LCSW 06/19/2017, 2:04 PM  [phone]

## 2017-06-19 NOTE — Progress Notes (Signed)
PROGRESS NOTE  [name] CBJ:[id number] DOB: Aug 19, 1934 DOA: 06/14/2017 PCP: System, Pcp Not In  HPI/Recap of past 24 hours: Joel Ibarra  is a 83 y.o. male, with past medical history significant for dementia, hypertension presents to the ED complaining of watery diarrhea with multiple episodes per day for the past 2 days prior to admission.  Patient reports associated mild lower abdominal cramping, and had one episode of nausea and vomiting in the ED.  Patient denies any fever/chills.  Of note, his wife was admitted for diarrhea on Monday here at [facility].  Patient was found to be positive for C. Difficile.  Patient admitted for further management.   Today, patient reported diarrhea significantly improved. Only an episode (as per RN a smear) noted overnight. Denies any abdominal pain.   Assessment/Plan: Principal Problem:   Diarrhea Active Problems:   Nausea & vomiting   Tachycardia  C. difficile colitis + norovirus gastroenteritis Improving, only a little episode overnight Afebrile, resolved leukocytosis C. difficile toxin positive stool, stool also positive for norovirus CT scan of the abdomen showed mild wall thickening and stranding around the descending colon concerning for early infectious or ischemic colitis Continue oral vancomycin for 10 days total Enteric precautions, monitor closely  Hypertension/bradycardia BP stable Hold home metoprolol  Dementia Continue Aricept   Code Status: Full  Family Communication: None at bedside  Disposition Plan: Rehab at their ALF, by likely 06/20/17   Consultants:  None  Procedures:  None  Antimicrobials:  Oral vancomycin  DVT prophylaxis: Lovenox   Objective: Vitals:   06/18/17 1333 06/18/17 2200 06/19/17 0602 06/19/17 1531  BP: 116/67 112/68 112/62 116/69  Pulse: 63 74 60 71  Resp: 16 18  16   Temp: 97.7 F (36.5 C) 98.9 F (37.2 C) 98.2 F (36.8 C)   TempSrc: Oral Oral Oral     SpO2: 98% 96% 95% 94%  Weight:   76 kg (167 lb 8.8 oz)   Height:   5\' 8"  (1.727 m)     Intake/Output Summary (Last 24 hours) at 06/19/2017 1641 Last data filed at 06/19/2017 1502 Gross per 24 hour  Intake 480 ml  Output 600 ml  Net -120 ml   Filed Weights   06/17/17 0500 06/18/17 0719 06/19/17 0602  Weight: 72.9 kg (160 lb 11.5 oz) 73.9 kg (162 lb 14.7 oz) 76 kg (167 lb 8.8 oz)    Exam:   General: NAD  Cardiovascular: S1, S2 present  Respiratory: CTAB  Abdomen: Soft, nondistended, NT, bowel sounds present  Musculoskeletal: No pedal edema bilaterally  Skin: Normal  Psychiatry: Normal mood   Data Reviewed: CBC: Recent Labs  Lab 06/14/17 2356 06/15/17 1037 06/16/17 0504 06/17/17 0502 06/18/17 0506 06/19/17 0507  WBC 6.1 9.1 10.9* 7.3 7.1 7.5  NEUTROABS 5.9  --  7.8* 5.4 5.1 4.8  HGB 15.8 15.8 15.1 13.9 13.5 12.8*  HCT 47.4 47.7 46.4 42.2 40.4 38.4*  MCV 93.3 94.5 92.8 93.0 91.2 91.4  PLT 127* 130* 95* 121* 131* 160   Basic Metabolic Panel: Recent Labs  Lab 06/15/17 1037 06/16/17 0700 06/17/17 0502 06/18/17 0506 06/19/17 0507  NA 146* 141 142 141 139  K 3.8 3.5 3.2* 3.4* 3.2*  CL 116* 114* 112* 111 108  CO2 21* 19* 22 23 23   GLUCOSE 92 104* 103* 107* 99  BUN 20 19 12 11 11   CREATININE 1.14 1.07 1.04 0.91 1.12  CALCIUM 8.8* 8.3* 8.4* 8.4* 8.3*   GFR: Estimated Creatinine Clearance: 50.9  mL/min (by C-G formula based on SCr of 1.12 mg/dL). Liver Function Tests: Recent Labs  Lab 06/14/17 2356 06/15/17 1037  AST 31 34  ALT 19 23  ALKPHOS 67 62  BILITOT 1.0 0.9  PROT 7.4 7.2  ALBUMIN 4.0 3.7   No results for input(s): LIPASE, AMYLASE in the last 168 hours. No results for input(s): AMMONIA in the last 168 hours. Coagulation Profile: No results for input(s): INR, PROTIME in the last 168 hours. Cardiac Enzymes: Recent Labs  Lab 06/15/17 1037  TROPONINI 0.04*   BNP (last 3 results) No results for input(s): PROBNP in the last 8760  hours. HbA1C: No results for input(s): HGBA1C in the last 72 hours. CBG: Recent Labs  Lab 06/15/17 1002 06/16/17 0837 06/17/17 0755 06/18/17 0717  GLUCAP 90 108* 85 87   Lipid Profile: No results for input(s): CHOL, HDL, LDLCALC, TRIG, CHOLHDL, LDLDIRECT in the last 72 hours. Thyroid Function Tests: No results for input(s): TSH, T4TOTAL, FREET4, T3FREE, THYROIDAB in the last 72 hours. Anemia Panel: No results for input(s): VITAMINB12, FOLATE, FERRITIN, TIBC, IRON, RETICCTPCT in the last 72 hours. Urine analysis:    Component Value Date/Time   COLORURINE YELLOW 06/15/2017 1528   APPEARANCEUR HAZY (A) 06/15/2017 1528   LABSPEC 1.026 06/15/2017 1528   PHURINE 5.0 06/15/2017 1528   GLUCOSEU NEGATIVE 06/15/2017 1528   HGBUR SMALL (A) 06/15/2017 1528   BILIRUBINUR NEGATIVE 06/15/2017 1528   KETONESUR NEGATIVE 06/15/2017 1528   PROTEINUR 30 (A) 06/15/2017 1528   NITRITE NEGATIVE 06/15/2017 1528   LEUKOCYTESUR NEGATIVE 06/15/2017 1528   Sepsis Labs: @LABRCNTIP (procalcitonin:4,lacticidven:4)  ) Recent Results (from the past 240 hour(s))  Gastrointestinal Panel by PCR , Stool     Status: Abnormal   Collection Time: 06/15/17  2:31 AM  Result Value Ref Range Status   Campylobacter species NOT DETECTED NOT DETECTED Final   Plesimonas shigelloides NOT DETECTED NOT DETECTED Final   Salmonella species NOT DETECTED NOT DETECTED Final   Yersinia enterocolitica NOT DETECTED NOT DETECTED Final   Vibrio species NOT DETECTED NOT DETECTED Final   Vibrio cholerae NOT DETECTED NOT DETECTED Final   Enteroaggregative E coli (EAEC) NOT DETECTED NOT DETECTED Final   Enteropathogenic E coli (EPEC) NOT DETECTED NOT DETECTED Final   Enterotoxigenic E coli (ETEC) NOT DETECTED NOT DETECTED Final   Shiga like toxin producing E coli (STEC) NOT DETECTED NOT DETECTED Final   Shigella/Enteroinvasive E coli (EIEC) NOT DETECTED NOT DETECTED Final   Cryptosporidium NOT DETECTED NOT DETECTED Final    Cyclospora cayetanensis NOT DETECTED NOT DETECTED Final   Entamoeba histolytica NOT DETECTED NOT DETECTED Final   Giardia lamblia NOT DETECTED NOT DETECTED Final   Adenovirus F40/41 NOT DETECTED NOT DETECTED Final   Astrovirus NOT DETECTED NOT DETECTED Final   Norovirus GI/GII DETECTED (A) NOT DETECTED Final    Comment: RESULT CALLED TO, READ BACK BY AND VERIFIED WITH: JAZMYN ROBINSON @ 1826 ON 06/15/2017 BY CAF    Rotavirus A NOT DETECTED NOT DETECTED Final   Sapovirus (I, II, IV, and V) NOT DETECTED NOT DETECTED Final    Comment: Performed at [facility], [address] [place], [place] [address]  C difficile quick scan w PCR reflex     Status: Abnormal   Collection Time: 06/15/17  2:31 AM  Result Value Ref Range Status   C Diff antigen POSITIVE (A) NEGATIVE Final   C Diff toxin POSITIVE (A) NEGATIVE Final   C Diff interpretation Toxin producing C. difficile detected.  Final    Comment: CRITICAL RESULT CALLED TO, READ BACK BY AND VERIFIED WITH: L ADKINS,RN @0323  06/15/17 MKELLY Performed at [facility], [place] [address] [place], [place] [address]       Studies: No results found.  Scheduled Meds: . aspirin EC  81 mg Oral Daily  . chlorhexidine  15 mL Mouth Rinse BID  . donepezil  5 mg Oral BID  . enoxaparin (LOVENOX) injection  40 mg Subcutaneous Q24H  . mouth rinse  15 mL Mouth Rinse q12n4p  . potassium chloride  40 mEq Oral BID  . sertraline  100 mg Oral Daily  . sodium chloride flush  3 mL Intravenous Q12H  . vancomycin  125 mg Oral QID    Continuous Infusions:    LOS: 4 days     Alma Friendly, MD [facility]   If 7PM-7AM, please contact night-coverage [email or web address] Password [facility] 06/19/2017, 4:41 PM

## 2017-06-20 LAB — BASIC METABOLIC PANEL
ANION GAP: 8 (ref 5–15)
BUN: 15 mg/dL (ref 6–20)
CHLORIDE: 109 mmol/L (ref 101–111)
CO2: 25 mmol/L (ref 22–32)
Calcium: 8.4 mg/dL — ABNORMAL LOW (ref 8.9–10.3)
Creatinine, Ser: 1.1 mg/dL (ref 0.61–1.24)
GFR calc Af Amer: >60 mL/min (ref >60)
Glucose, Bld: 103 mg/dL — ABNORMAL HIGH (ref 65–99)
POTASSIUM: 3.9 mmol/L (ref 3.5–5.1)
Sodium: 142 mmol/L (ref 135–145)

## 2017-06-20 LAB — GLUCOSE, CAPILLARY: Glucose-Capillary: 89 mg/dL (ref 65–99)

## 2017-06-20 MED ORDER — VANCOMYCIN 50 MG/ML ORAL SOLUTION: 125.0000 mg | Freq: Four times a day (QID) | ORAL | 0 refills | Status: AC

## 2017-06-20 NOTE — Clinical Social Work Placement (Signed)
Patient received and accepted bed offer at [facility]. Facility aware of patient's discharge and confirmed bed offer. PTAR contacted, patient's family notified. Patient's RN can call report to [phone] Room 7004, packet complete. CSW signing off, no other needs identified at this time.  CLINICAL SOCIAL WORK PLACEMENT  NOTE  Date:  06/20/2017  Patient Details  Name: Joel Ibarra MRN: [id number] Date of Birth: [birth year]  Clinical Social Work is seeking post-discharge placement for this patient at the [place] level of care (*CSW will initial, date and re-position this form in  chart as items are completed):  Yes   Patient/family provided with [place] Work Department's list of facilities offering this level of care within the geographic area requested by the patient (or if unable, by the patient's family).  Yes   Patient/family informed of their freedom to choose among providers that offer the needed level of care, that participate in Medicare, Medicaid or managed care program needed by the patient, have an available bed and are willing to accept the patient.  Yes   Patient/family informed of [facility]'s ownership interest in [facility] and [facility], as well as of the fact that they are under no obligation to receive care at these facilities.  PASRR submitted to EDS on       PASRR number received on       Existing PASRR number confirmed on 06/18/17     FL2 transmitted to all facilities in geographic area requested by pt/family on 06/18/17     FL2 transmitted to all facilities within larger geographic area on       Patient informed that his/her managed care company has contracts with or will negotiate with certain facilities, including the following:        Yes   Patient/family informed of bed offers received.  Patient chooses bed at [facility] recommends and patient chooses bed at       Patient to be transferred to [facility] on 06/20/17.  Patient to be transferred to facility by PTAR     Patient family notified on 06/20/17 of transfer.  Name of family member notified:  Denny Peon      PHYSICIAN       Additional Comment:    _______________________________________________ Burnis Medin, LCSW 06/20/2017, 3:52 PM

## 2017-06-20 NOTE — Progress Notes (Addendum)
Pt noted to have one loose stool---no "watery" stools noted. SRP,RN

## 2017-06-20 NOTE — Progress Notes (Signed)
Report called to [facility], pt in stable conditon, daughter aware of pt discharged. EMS called by Education officer, museum. SRP, RN

## 2017-06-20 NOTE — Discharge Summary (Signed)
Discharge Summary  Joel Ibarra QPR:[id number] DOB: 1935-02-15  PCP: System, Pcp Not In  Admit date: 06/14/2017 Discharge date: 06/20/2017  Time spent: 35 mins   Recommendations for Outpatient Follow-up:  1. PCP at [facility] 2. Rehab at [facility]   Discharge Diagnoses:  Active Hospital Problems   Diagnosis Date Noted  . Diarrhea 06/15/2017  . Nausea & vomiting 06/15/2017  . Tachycardia 06/15/2017    Resolved Hospital Problems  No resolved problems to display.    Discharge Condition: Stable  Diet recommendation: Regular  Vitals:   06/19/17 2203 06/20/17 0435  BP: 123/75 (!) 146/71  Pulse: 71 60  Resp: 15 17  Temp: 98.1 F (36.7 C) 97.7 F (36.5 C)  SpO2: 96% 96%    History of present illness:  JamesSmithermanis a82 y.o.male, with past medical history significant for dementia, hypertension presents to the ED complaining of watery diarrhea with multiple episodes per day for the past 2 days prior to admission.  Patient reports associated mild lower abdominal cramping, and had one episode of nausea and vomiting in the ED.  Patient denies any fever/chills.  Of note, his wife was admitted for diarrhea on Monday here at [facility].  Patient was found to be positive for C. Difficile.  Patient admitted for further management.  Today, patient reported feeling much better, just 1 loose stool noted today (not watery). Denies any abdominal pain/fever/chills. Stable to be d/c back to his ALF (will have rehab as well in [facility])   Hospital Course:  Principal Problem:   Diarrhea Active Problems:   Nausea & vomiting   Tachycardia  C. difficile colitis + norovirus gastroenteritis Improved Afebrile, resolved leukocytosis C. difficile toxin positive stool, stool also positive for norovirus CT scan of the abdomen showed mild wall thickening and stranding around the descending colon concerning for early infectious or ischemic colitis Continue oral vancomycin  for 10 days total, last dose on 06/24/17 Enteric precautions, monitor closely  Hypertension/bradycardia BP stable Continue home metoprolol  Dementia Continue Aricept    Procedures:  None  Consultations:  None  Discharge Exam: BP (!) 146/71 (BP Location: Left Arm)   Pulse 60   Temp 97.7 F (36.5 C) (Oral)   Resp 17   Ht 5\' 8"  (1.727 m)   Wt 75 kg (165 lb 5.5 oz)   SpO2 96%   BMI 25.14 kg/m   General: NAD Cardiovascular: S1, S2 present Respiratory: CTAB  Discharge Instructions You were cared for by a hospitalist during your hospital stay. If you have any questions about your discharge medications or the care you received while you were in the hospital after you are discharged, you can call the unit and asked to speak with the hospitalist on call if the hospitalist that took care of you is not available. Once you are discharged, your primary care physician will handle any further medical issues. Please note that NO REFILLS for any discharge medications will be authorized once you are discharged, as it is imperative that you return to your primary care physician (or establish a relationship with a primary care physician if you do not have one) for your aftercare needs so that they can reassess your need for medications and monitor your lab values.  Discharge Instructions    Diet - low sodium heart healthy   Complete by:  As directed    Increase activity slowly   Complete by:  As directed      Allergies as of 06/20/2017  Reactions   Bee Venom Swelling   Codeine Nausea Only, Nausea And Vomiting, Other (See Comments), Swelling   Other Reaction: Other reaction Other reaction(s): Swelling Other reaction(s): Swelling Other reaction(s): Swelling   Daptomycin Other (See Comments)   Pneumonitis   Clindamycin/lincomycin Rash   Lincomycin Rash   Lactose Other (See Comments), Diarrhea   Other reaction(s): Cramps (ALLERGY/intolerance) Other reaction(s): Cramps  (ALLERGY/intolerance) Other reaction(s): Cramps (ALLERGY/intolerance) Other reaction: Cramps (ALLERGY/intolerance)--Pt avoids ALL lactose containing items including items with lactose baked in. JLS 11/25/16. Other reaction(s): Cramps (ALLERGY/intolerance) Other reaction(s): Cramps (ALLERGY/intolerance) Other reaction(s): Cramps (ALLERGY/intolerance)      Medication List    TAKE these medications   aspirin EC 81 MG tablet Take 81 mg by mouth daily.   donepezil 10 MG tablet Commonly known as:  ARICEPT Take 5 mg by mouth 2 (two) times daily. 0.5 Tablet in AM, 0.5 Tablet at night   metoprolol succinate 100 MG 24 hr tablet Commonly known as:  TOPROL-XL Take 100 mg by mouth daily.   sertraline 100 MG tablet Commonly known as:  ZOLOFT Take 100 mg by mouth daily.   vancomycin 50 mg/mL oral solution Commonly known as:  VANCOCIN Take 2.5 mLs (125 mg total) by mouth 4 (four) times daily for 4 days.      Allergies  Allergen Reactions  . Bee Venom Swelling  . Codeine Nausea Only, Nausea And Vomiting, Other (See Comments) and Swelling    Other Reaction: Other reaction Other reaction(s): Swelling Other reaction(s): Swelling Other reaction(s): Swelling   . Daptomycin Other (See Comments)    Pneumonitis  . Clindamycin/Lincomycin Rash  . Lincomycin Rash  . Lactose Other (See Comments) and Diarrhea    Other reaction(s): Cramps (ALLERGY/intolerance) Other reaction(s): Cramps (ALLERGY/intolerance) Other reaction(s): Cramps (ALLERGY/intolerance) Other reaction: Cramps (ALLERGY/intolerance)--Pt avoids ALL lactose containing items including items with lactose baked in. JLS 11/25/16. Other reaction(s): Cramps (ALLERGY/intolerance) Other reaction(s): Cramps (ALLERGY/intolerance) Other reaction(s): Cramps (ALLERGY/intolerance)    Contact information for after-discharge care    Destination    [facility] .   Service:  Skilled Nursing Contact information: [address] [place] [phone]               The results of significant diagnostics from this hospitalization (including imaging, microbiology, ancillary and laboratory) are listed below for reference.    Significant Diagnostic Studies: Ct Abdomen Pelvis Wo Contrast  Result Date: 06/15/2017 CLINICAL DATA:  Nausea, vomiting EXAM: CT ABDOMEN AND PELVIS WITHOUT CONTRAST TECHNIQUE: Multidetector CT imaging of the abdomen and pelvis was performed following the standard protocol without IV contrast. COMPARISON:  None. FINDINGS: Lower chest: Small nodule in the inferior right middle lobe measures 5 mm. Scarring in the lung bases bilaterally. Heart is borderline enlarged. No effusions. Hepatobiliary: No focal liver abnormality is seen. Status post cholecystectomy. No biliary dilatation. Pancreas: No focal abnormality or ductal dilatation. Spleen: No focal abnormality.  Normal size. Adrenals/Urinary Tract: Punctate nonobstructing stone in the midpole of the left kidney. No ureteral stones or hydronephrosis. No renal or adrenal mass. Urinary bladder unremarkable. Stomach/Bowel: Scattered colonic diverticulosis. There is mild apparent wall thickening in the descending colon diffusely with slight haziness around the descending colon suggesting mild colitis. This could be infectious or ischemic. Small duodenal diverticulum in the region of the pancreatic head. Stomach is unremarkable. No evidence of bowel obstruction. Vascular/Lymphatic: Aortic atherosclerosis. No enlarged abdominal or pelvic lymph nodes. Reproductive: Mild prostate enlargement with central calcifications. Other: No free fluid  or free air. Small bilateral inguinal hernias containing fat. Musculoskeletal: Degenerative changes in the lumbar spine. No acute bony abnormality. IMPRESSION: 5 mm right middle lobe pulmonary nodule. No follow-up needed if patient is low-risk. Non-contrast chest CT can be considered in 12  months if patient is high-risk. This recommendation follows the consensus statement: Guidelines for Management of Incidental Pulmonary Nodules Detected on CT Images: From the Fleischner Society 2017; Radiology 2017; 284:228-243. Scattered colonic diverticulosis. Mild wall thickening and stranding around the descending colon concerning for early infectious or ischemic colitis. Aortoiliac atherosclerosis. Prior cholecystectomy. Electronically Signed   By: Rolm Baptise M.D.   On: 06/15/2017 10:24    Microbiology: Recent Results (from the past 240 hour(s))  Gastrointestinal Panel by PCR , Stool     Status: Abnormal   Collection Time: 06/15/17  2:31 AM  Result Value Ref Range Status   Campylobacter species NOT DETECTED NOT DETECTED Final   Plesimonas shigelloides NOT DETECTED NOT DETECTED Final   Salmonella species NOT DETECTED NOT DETECTED Final   Yersinia enterocolitica NOT DETECTED NOT DETECTED Final   Vibrio species NOT DETECTED NOT DETECTED Final   Vibrio cholerae NOT DETECTED NOT DETECTED Final   Enteroaggregative E coli (EAEC) NOT DETECTED NOT DETECTED Final   Enteropathogenic E coli (EPEC) NOT DETECTED NOT DETECTED Final   Enterotoxigenic E coli (ETEC) NOT DETECTED NOT DETECTED Final   Shiga like toxin producing E coli (STEC) NOT DETECTED NOT DETECTED Final   Shigella/Enteroinvasive E coli (EIEC) NOT DETECTED NOT DETECTED Final   Cryptosporidium NOT DETECTED NOT DETECTED Final   Cyclospora cayetanensis NOT DETECTED NOT DETECTED Final   Entamoeba histolytica NOT DETECTED NOT DETECTED Final   Giardia lamblia NOT DETECTED NOT DETECTED Final   Adenovirus F40/41 NOT DETECTED NOT DETECTED Final   Astrovirus NOT DETECTED NOT DETECTED Final   Norovirus GI/GII DETECTED (A) NOT DETECTED Final    Comment: RESULT CALLED TO, READ BACK BY AND VERIFIED WITH: JAZMYN ROBINSON @ 1826 ON 06/15/2017 BY CAF    Rotavirus A NOT DETECTED NOT DETECTED Final   Sapovirus (I, II, IV, and V) NOT DETECTED NOT  DETECTED Final    Comment: Performed at [facility], [address] [place], [place] [address]  C difficile quick scan w PCR reflex     Status: Abnormal   Collection Time: 06/15/17  2:31 AM  Result Value Ref Range Status   C Diff antigen POSITIVE (A) NEGATIVE Final   C Diff toxin POSITIVE (A) NEGATIVE Final   C Diff interpretation Toxin producing C. difficile detected.  Final    Comment: CRITICAL RESULT CALLED TO, READ BACK BY AND VERIFIED WITH: L ADKINS,RN @0323  06/15/17 MKELLY Performed at [facility], [place] [address] [place], [place] [address]      Labs: Basic Metabolic Panel: Recent Labs  Lab 06/16/17 0700 06/17/17 0502 06/18/17 0506 06/19/17 0507 06/20/17 0521  NA 141 142 141 139 142  K 3.5 3.2* 3.4* 3.2* 3.9  CL 114* 112* 111 108 109  CO2 19* 22 23 23 25   GLUCOSE 104* 103* 107* 99 103*  BUN 19 12 11 11 15   CREATININE 1.07 1.04 0.91 1.12 1.10  CALCIUM 8.3* 8.4* 8.4* 8.3* 8.4*   Liver Function Tests: Recent Labs  Lab 06/14/17 2356 06/15/17 1037  AST 31 34  ALT 19 23  ALKPHOS 67 62  BILITOT 1.0 0.9  PROT 7.4 7.2  ALBUMIN 4.0 3.7   No results for input(s): LIPASE, AMYLASE in the last  168 hours. No results for input(s): AMMONIA in the last 168 hours. CBC: Recent Labs  Lab 06/14/17 2356 06/15/17 1037 06/16/17 0504 06/17/17 0502 06/18/17 0506 06/19/17 0507  WBC 6.1 9.1 10.9* 7.3 7.1 7.5  NEUTROABS 5.9  --  7.8* 5.4 5.1 4.8  HGB 15.8 15.8 15.1 13.9 13.5 12.8*  HCT 47.4 47.7 46.4 42.2 40.4 38.4*  MCV 93.3 94.5 92.8 93.0 91.2 91.4  PLT 127* 130* 95* 121* 131* 155   Cardiac Enzymes: Recent Labs  Lab 06/15/17 1037  TROPONINI 0.04*   BNP: BNP (last 3 results) No results for input(s): BNP in the last 8760 hours.  ProBNP (last 3 results) No results for input(s): PROBNP in the last 8760 hours.  CBG: Recent Labs  Lab 06/15/17 1002 06/16/17 0837 06/17/17 0755 06/18/17 0717 06/20/17 0753  GLUCAP 90 108* 85 87 89        Signed:  Alma Friendly, MD [facility] 06/20/2017, 2:45 PM

## 2018-01-29 ENCOUNTER — Emergency Department (HOSPITAL_COMMUNITY): Payer: Medicare Other

## 2018-01-29 ENCOUNTER — Encounter (HOSPITAL_COMMUNITY): Payer: Self-pay

## 2018-01-29 ENCOUNTER — Inpatient Hospital Stay (HOSPITAL_COMMUNITY)
Admission: EM | Admit: 2018-01-29 | Discharge: 2018-02-05 | DRG: 475 | Disposition: A | Discharge status: Skilled Nursing Facility | Payer: Medicare Other | Attending: Internal Medicine | Admitting: Internal Medicine

## 2018-01-29 ENCOUNTER — Other Ambulatory Visit: Payer: Self-pay

## 2018-01-29 DIAGNOSIS — Z1629 Resistance to other single specified antibiotic: Secondary | ICD-10-CM | POA: Diagnosis present

## 2018-01-29 DIAGNOSIS — F0390 Unspecified dementia without behavioral disturbance: Secondary | ICD-10-CM | POA: Diagnosis present

## 2018-01-29 DIAGNOSIS — N183 Chronic kidney disease, stage 3 (moderate): Secondary | ICD-10-CM | POA: Diagnosis present

## 2018-01-29 DIAGNOSIS — I10 Essential (primary) hypertension: Secondary | ICD-10-CM | POA: Diagnosis not present

## 2018-01-29 DIAGNOSIS — Z87891 Personal history of nicotine dependence: Secondary | ICD-10-CM

## 2018-01-29 DIAGNOSIS — L03032 Cellulitis of left toe: Secondary | ICD-10-CM | POA: Diagnosis not present

## 2018-01-29 DIAGNOSIS — L0390 Cellulitis, unspecified: Secondary | ICD-10-CM | POA: Diagnosis present

## 2018-01-29 DIAGNOSIS — M869 Osteomyelitis, unspecified: Principal | ICD-10-CM | POA: Diagnosis present

## 2018-01-29 DIAGNOSIS — Z87442 Personal history of urinary calculi: Secondary | ICD-10-CM

## 2018-01-29 DIAGNOSIS — R739 Hyperglycemia, unspecified: Secondary | ICD-10-CM | POA: Diagnosis present

## 2018-01-29 DIAGNOSIS — N179 Acute kidney failure, unspecified: Secondary | ICD-10-CM

## 2018-01-29 DIAGNOSIS — L97529 Non-pressure chronic ulcer of other part of left foot with unspecified severity: Secondary | ICD-10-CM | POA: Diagnosis present

## 2018-01-29 DIAGNOSIS — Z8614 Personal history of Methicillin resistant Staphylococcus aureus infection: Secondary | ICD-10-CM

## 2018-01-29 DIAGNOSIS — Z89412 Acquired absence of left great toe: Secondary | ICD-10-CM

## 2018-01-29 DIAGNOSIS — Z79899 Other long term (current) drug therapy: Secondary | ICD-10-CM

## 2018-01-29 DIAGNOSIS — Z66 Do not resuscitate: Secondary | ICD-10-CM | POA: Diagnosis present

## 2018-01-29 DIAGNOSIS — Z1611 Resistance to penicillins: Secondary | ICD-10-CM | POA: Diagnosis present

## 2018-01-29 DIAGNOSIS — Z859 Personal history of malignant neoplasm, unspecified: Secondary | ICD-10-CM

## 2018-01-29 DIAGNOSIS — R000 Tachycardia, unspecified: Secondary | ICD-10-CM | POA: Diagnosis present

## 2018-01-29 DIAGNOSIS — Z23 Encounter for immunization: Secondary | ICD-10-CM

## 2018-01-29 DIAGNOSIS — I129 Hypertensive chronic kidney disease with stage 1 through stage 4 chronic kidney disease, or unspecified chronic kidney disease: Secondary | ICD-10-CM | POA: Diagnosis present

## 2018-01-29 DIAGNOSIS — Z1623 Resistance to quinolones and fluoroquinolones: Secondary | ICD-10-CM | POA: Diagnosis present

## 2018-01-29 DIAGNOSIS — Z881 Allergy status to other antibiotic agents status: Secondary | ICD-10-CM

## 2018-01-29 DIAGNOSIS — L02612 Cutaneous abscess of left foot: Secondary | ICD-10-CM

## 2018-01-29 DIAGNOSIS — B9562 Methicillin resistant Staphylococcus aureus infection as the cause of diseases classified elsewhere: Secondary | ICD-10-CM | POA: Diagnosis present

## 2018-01-29 DIAGNOSIS — Z91011 Allergy to milk products: Secondary | ICD-10-CM | POA: Diagnosis not present

## 2018-01-29 DIAGNOSIS — L03116 Cellulitis of left lower limb: Secondary | ICD-10-CM | POA: Diagnosis present

## 2018-01-29 DIAGNOSIS — Z91030 Bee allergy status: Secondary | ICD-10-CM

## 2018-01-29 DIAGNOSIS — Z885 Allergy status to narcotic agent status: Secondary | ICD-10-CM

## 2018-01-29 DIAGNOSIS — D696 Thrombocytopenia, unspecified: Secondary | ICD-10-CM | POA: Diagnosis not present

## 2018-01-29 DIAGNOSIS — N189 Chronic kidney disease, unspecified: Secondary | ICD-10-CM

## 2018-01-29 DIAGNOSIS — Z7982 Long term (current) use of aspirin: Secondary | ICD-10-CM | POA: Diagnosis not present

## 2018-01-29 HISTORY — DX: Malignant (primary) neoplasm, unspecified: C80.1

## 2018-01-29 LAB — CBC WITH DIFFERENTIAL/PLATELET
ABS IMMATURE GRANULOCYTES: 0.07 10*3/uL (ref 0.00–0.07)
BASOS ABS: 0 10*3/uL (ref 0.0–0.1)
Basophils Relative: 0 %
Eosinophils Absolute: 0.1 10*3/uL (ref 0.0–0.5)
Eosinophils Relative: 1 %
HEMATOCRIT: 44.1 % (ref 39.0–52.0)
Hemoglobin: 14 g/dL (ref 13.0–17.0)
IMMATURE GRANULOCYTES: 1 %
LYMPHS ABS: 1.1 10*3/uL (ref 0.7–4.0)
LYMPHS PCT: 12 %
MCH: 30.9 pg (ref 26.0–34.0)
MCHC: 31.7 g/dL (ref 30.0–36.0)
MCV: 97.4 fL (ref 80.0–100.0)
MONOS PCT: 15 %
Monocytes Absolute: 1.3 10*3/uL — ABNORMAL HIGH (ref 0.1–1.0)
NEUTROS ABS: 6.3 10*3/uL (ref 1.7–7.7)
NEUTROS PCT: 71 %
NRBC: 0 % (ref 0.0–0.2)
Platelets: 164 10*3/uL (ref 150–400)
RBC: 4.53 MIL/uL (ref 4.22–5.81)
RDW: 12.7 % (ref 11.5–15.5)
WBC: 8.9 10*3/uL (ref 4.0–10.5)

## 2018-01-29 LAB — BASIC METABOLIC PANEL
ANION GAP: 12 (ref 5–15)
BUN: 23 mg/dL (ref 8–23)
CALCIUM: 9 mg/dL (ref 8.9–10.3)
CO2: 25 mmol/L (ref 22–32)
Chloride: 103 mmol/L (ref 98–111)
Creatinine, Ser: 1.32 mg/dL — ABNORMAL HIGH (ref 0.61–1.24)
GFR, EST AFRICAN AMERICAN: 58 mL/min — AB (ref >60)
GFR, EST NON AFRICAN AMERICAN: 50 mL/min — AB (ref >60)
GLUCOSE: 99 mg/dL (ref 70–99)
Potassium: 4.3 mmol/L (ref 3.5–5.1)
Sodium: 140 mmol/L (ref 135–145)

## 2018-01-29 MED ORDER — ONDANSETRON HCL 4 MG PO TABS: 4.0000 mg | ORAL_TABLET | Freq: Four times a day (QID) | ORAL | Status: DC | PRN

## 2018-01-29 MED ORDER — ACETAMINOPHEN 325 MG PO TABS: 650.0000 mg | ORAL_TABLET | Freq: Four times a day (QID) | ORAL | Status: DC | PRN

## 2018-01-29 MED ORDER — METOPROLOL SUCCINATE ER 50 MG PO TB24
100.0000 mg | ORAL_TABLET | Freq: Every day | ORAL | Status: DC
  Administered 2018-01-30 – 2018-02-05 (×6): 100 mg via ORAL
  Filled 2018-01-29 (×6): qty 2

## 2018-01-29 MED ORDER — SODIUM CHLORIDE 0.9 % IV BOLUS
500.0000 mL | Freq: Once | INTRAVENOUS | Status: AC
  Administered 2018-01-29: 500 mL via INTRAVENOUS

## 2018-01-29 MED ORDER — VANCOMYCIN HCL IN DEXTROSE 1-5 GM/200ML-% IV SOLN
1000.0000 mg | Freq: Once | INTRAVENOUS | Status: AC
Start: 2018-01-29 — End: 2018-01-29
  Administered 2018-01-29: 1000 mg via INTRAVENOUS
  Filled 2018-01-29: qty 200

## 2018-01-29 MED ORDER — ONDANSETRON HCL 4 MG/2ML IJ SOLN: 4.0000 mg | Freq: Four times a day (QID) | INTRAMUSCULAR | Status: DC | PRN

## 2018-01-29 MED ORDER — DIPHENHYDRAMINE HCL 25 MG PO CAPS
25.0000 mg | ORAL_CAPSULE | Freq: Every day | ORAL | Status: DC
  Administered 2018-01-29 – 2018-02-04 (×7): 25 mg via ORAL
  Filled 2018-01-29 (×7): qty 1

## 2018-01-29 MED ORDER — DONEPEZIL HCL 5 MG PO TABS
5.0000 mg | ORAL_TABLET | Freq: Two times a day (BID) | ORAL | Status: DC
  Administered 2018-01-29 – 2018-02-05 (×13): 5 mg via ORAL
  Filled 2018-01-29 (×13): qty 1

## 2018-01-29 MED ORDER — SERTRALINE HCL 100 MG PO TABS
100.0000 mg | ORAL_TABLET | Freq: Every day | ORAL | Status: DC
  Administered 2018-01-30 – 2018-02-05 (×6): 100 mg via ORAL
  Filled 2018-01-29 (×6): qty 1

## 2018-01-29 MED ORDER — ACETAMINOPHEN 650 MG RE SUPP: 650.0000 mg | Freq: Four times a day (QID) | RECTAL | Status: DC | PRN

## 2018-01-29 MED ORDER — ASPIRIN EC 325 MG PO TBEC
325.0000 mg | DELAYED_RELEASE_TABLET | Freq: Every evening | ORAL | Status: DC
  Administered 2018-01-29 – 2018-02-03 (×6): 325 mg via ORAL
  Filled 2018-01-29 (×6): qty 1

## 2018-01-29 MED ORDER — VITAMIN D 25 MCG (1000 UNIT) PO TABS
5000.0000 [IU] | ORAL_TABLET | Freq: Every day | ORAL | Status: DC
  Administered 2018-01-30 – 2018-02-05 (×6): 5000 [IU] via ORAL
  Filled 2018-01-29 (×6): qty 5

## 2018-01-29 NOTE — Progress Notes (Signed)
A consult was received from an ED physician for vancomycin per pharmacy dosing (for an indication other than meningitis). The patient's profile has been reviewed for ht/wt/allergies/indication/available labs. A one time order has been placed for the above antibiotics.  Further antibiotics/pharmacy consults should be ordered by admitting physician if indicated.                       Reuel Boom, PharmD, BCPS [phone] 01/29/2018, 7:58 PM

## 2018-01-29 NOTE — ED Triage Notes (Signed)
Patient went to his PCP today and right foot was marked outlining redness. Patient has redness of the right foot and right leg. Paitent c/o increased pain.

## 2018-01-29 NOTE — ED Notes (Signed)
ED TO INPATIENT HANDOFF REPORT  Name/Age/Gender Joel Ibarra 83 y.o. male  Code Status    Code Status Orders  (From admission, onward)         Start     Ordered   01/29/18 2202  Full code  Continuous     01/29/18 2201        Code Status History    Date Active Date Inactive Code Status Order ID Comments User Context   06/15/2017 0914 06/20/2017 2132 Full Code [id number]  Jani Gravel, MD ED   06/15/2017 0743 06/15/2017 0914 Full Code [id number]  Alma Friendly, MD ED    Advance Directive Documentation     Most Recent Value  Type of Advance Directive  Healthcare Power of Attorney, Living will  Pre-existing out of facility DNR order (yellow form or pink MOST form)  -  "MOST" Form in Place?  -      Home/SNF/Other Home  Chief Complaint leg pain   Level of Care/Admitting Diagnosis ED Disposition    ED Disposition Condition [place]: [facility] [100102]  Level of Care: Med-Surg [16]  Diagnosis: Cellulitis [782956]  Admitting Physician: Rise Patience [phone]  Attending Physician: Rise Patience [phone]  Estimated length of stay: past midnight tomorrow  Certification:: I certify this patient will need inpatient services for at least 2 midnights  PT Class (Do Not Modify): Inpatient [101]  PT Acc Code (Do Not Modify): Private [1]       Medical History Past Medical History:  Diagnosis Date  . Cancer ([place])   . Dementia ([place])   . Hypertension   . Kidney stones     Allergies Allergies  Allergen Reactions  . Bee Venom Swelling  . Codeine Nausea Only, Nausea And Vomiting, Other (See Comments) and Swelling    Other Reaction: Other reaction Other reaction(s): Swelling Other reaction(s): Swelling Other reaction(s): Swelling   . Daptomycin Other (See Comments)    Pneumonitis  . Clindamycin/Lincomycin Rash  . Lincomycin Rash  . Lactose Other (See Comments) and Diarrhea    Other reaction(s): Cramps  (ALLERGY/intolerance) Other reaction(s): Cramps (ALLERGY/intolerance) Other reaction(s): Cramps (ALLERGY/intolerance) Other reaction: Cramps (ALLERGY/intolerance)--Pt avoids ALL lactose containing items including items with lactose baked in. JLS 11/25/16. Other reaction(s): Cramps (ALLERGY/intolerance) Other reaction(s): Cramps (ALLERGY/intolerance) Other reaction(s): Cramps (ALLERGY/intolerance)     IV Location/Drains/Wounds Patient Lines/Drains/Airways Status   Active Line/Drains/Airways    Name:   Placement date:   Placement time:   Site:   Days:   Peripheral IV 01/29/18 Right Antecubital   01/29/18    2015    Antecubital   less than 1   External Urinary Catheter   06/15/17    1717    -   228   Wound / Incision (Open or Dehisced) 06/15/17 Other (Comment) Foot Left   06/15/17    1719    Foot   228          Labs/Imaging Results for orders placed or performed during the hospital encounter of 01/29/18 (from the past 48 hour(s))  CBC with Differential     Status: Abnormal   Collection Time: 01/29/18  8:03 PM  Result Value Ref Range   WBC 8.9 4.0 - 10.5 K/uL   RBC 4.53 4.22 - 5.81 MIL/uL   Hemoglobin 14.0 13.0 - 17.0 g/dL   HCT 44.1 39.0 - 52.0 %   MCV 97.4 80.0 - 100.0 fL   MCH 30.9 26.0 - 34.0  pg   MCHC 31.7 30.0 - 36.0 g/dL   RDW 12.7 11.5 - 15.5 %   Platelets 164 150 - 400 K/uL   nRBC 0.0 0.0 - 0.2 %   Neutrophils Relative % 71 %   Neutro Abs 6.3 1.7 - 7.7 K/uL   Lymphocytes Relative 12 %   Lymphs Abs 1.1 0.7 - 4.0 K/uL   Monocytes Relative 15 %   Monocytes Absolute 1.3 (H) 0.1 - 1.0 K/uL   Eosinophils Relative 1 %   Eosinophils Absolute 0.1 0.0 - 0.5 K/uL   Basophils Relative 0 %   Basophils Absolute 0.0 0.0 - 0.1 K/uL   Immature Granulocytes 1 %   Abs Immature Granulocytes 0.07 0.00 - 0.07 K/uL    Comment: Performed at [facility], [place] [address], [place], [place] [address]  Basic metabolic panel     Status: Abnormal   Collection Time: 01/29/18   8:03 PM  Result Value Ref Range   Sodium 140 135 - 145 mmol/L   Potassium 4.3 3.5 - 5.1 mmol/L   Chloride 103 98 - 111 mmol/L   CO2 25 22 - 32 mmol/L   Glucose, Bld 99 70 - 99 mg/dL   BUN 23 8 - 23 mg/dL   Creatinine, Ser 1.32 (H) 0.61 - 1.24 mg/dL   Calcium 9.0 8.9 - 10.3 mg/dL   GFR calc non Af Amer 50 (L) >60 mL/min   GFR calc Af Amer 58 (L) >60 mL/min   Anion gap 12 5 - 15    Comment: Performed at [facility] [address] [place], [place] [address]   Dg Foot Complete Left  Result Date: 01/29/2018 CLINICAL DATA:  Cellulitis left foot EXAM: LEFT FOOT - COMPLETE 3+ VIEW COMPARISON:  08/09/2016 FINDINGS: Prior transmetatarsal amputation of the left 1st toe. No acute bony abnormality. No bony destruction to suggest acute osteomyelitis. Plantar calcaneal spur. Soft tissues are intact. IMPRESSION: Prior 1st toe transmetatarsal amputation. No acute bony abnormality. Electronically Signed   By: Rolm Baptise M.D.   On: 01/29/2018 20:55    Pending Labs Unresulted Labs (From admission, onward)    Start     Ordered   01/30/18 4128  Basic metabolic panel  Tomorrow morning,   R     01/29/18 2201   01/30/18 0500  CBC  Tomorrow morning,   R     01/29/18 2201   01/29/18 1954  Aerobic/Anaerobic Culture (surgical/deep wound)  Once,   R    Question:  Patient immune status  Answer:  Normal   01/29/18 1955          Vitals/Pain Today's Vitals   01/29/18 1752 01/29/18 2001 01/29/18 2100 01/29/18 2145  BP:  135/78 (!) 144/66 136/66  Pulse:  (!) 57 (!) 59 62  Resp:  (!) 23 (!) 25 19  Temp:      TempSrc:      SpO2:  99% 97% 98%  Weight: 74.8 kg     Height: 5\' 11"  (1.803 m)     PainSc:        Isolation Precautions No active isolations  Medications Medications  aspirin EC tablet 325 mg (has no administration in time range)  metoprolol succinate (TOPROL-XL) 24 hr tablet 100 mg (has no administration in time range)  donepezil (ARICEPT) tablet 5 mg (has no  administration in time range)  sertraline (ZOLOFT) tablet 100 mg (has no administration in time range)  Cholecalciferol 5,000 Units (has no administration in  time range)  diphenhydrAMINE (BENADRYL) capsule 25 mg (has no administration in time range)  acetaminophen (TYLENOL) tablet 650 mg (has no administration in time range)    Or  acetaminophen (TYLENOL) suppository 650 mg (has no administration in time range)  ondansetron (ZOFRAN) tablet 4 mg (has no administration in time range)    Or  ondansetron (ZOFRAN) injection 4 mg (has no administration in time range)  sodium chloride 0.9 % bolus 500 mL (0 mLs Intravenous Stopped 01/29/18 2146)  vancomycin (VANCOCIN) IVPB 1000 mg/200 mL premix (0 mg Intravenous Stopped 01/29/18 2146)    Mobility walks

## 2018-01-29 NOTE — ED Notes (Signed)
Patient transported to X-ray 

## 2018-01-29 NOTE — ED Notes (Signed)
Pt's daughter, [name], would like to be contacted with any updates regarding care. She can be reached at [phone].

## 2018-01-29 NOTE — H&P (Signed)
History and Physical    Joel Ibarra NTI:[id number] DOB: 06-01-1934 DOA: 01/29/2018  PCP: System, Pcp Not In  Patient coming from: [facility] living facility.  Chief Complaint: Left foot erythema and left foot second toe discharge.  HPI: Joel Ibarra is a 83 y.o. male with history of hypertension dementia was brought to the ER after patient had progressively worsening erythema and swelling of the left foot second toe which is moving progressively proximally and now is involving up to the midcalf.  Denies any trauma or fall.  As per patient's son-in-law who is providing the history when patient visited last month or no weight with PCP patient's leg was okay.  Subsequent days which patient started having the swelling in the left foot second toe which has progressed to the foot and mid calf now.  Patient's son-in-law also states that patient's left foot second toe was probed previously by a physician and the infection got worse.  ED Course: In the ER x-rays do not show any bony involvement.  On exam patient has erythema of the left foot up to the midcalf.  Left foot second toe looks erythematous with possible abscess.  Review of Systems: As per HPI, rest all negative.   Past Medical History:  Diagnosis Date  . Cancer ([place])   . Dementia ([place])   . Hypertension   . Kidney stones     Past Surgical History:  Procedure Laterality Date  . TOE AMPUTATION       reports that he has quit smoking. He has never used smokeless tobacco. He reports that he does not drink alcohol or use drugs.  Allergies  Allergen Reactions  . Bee Venom Swelling  . Codeine Nausea Only, Nausea And Vomiting, Other (See Comments) and Swelling    Other Reaction: Other reaction Other reaction(s): Swelling Other reaction(s): Swelling Other reaction(s): Swelling   . Daptomycin Other (See Comments)    Pneumonitis  . Clindamycin/Lincomycin Rash  . Lincomycin Rash  . Lactose Other (See Comments) and  Diarrhea    Other reaction(s): Cramps (ALLERGY/intolerance) Other reaction(s): Cramps (ALLERGY/intolerance) Other reaction(s): Cramps (ALLERGY/intolerance) Other reaction: Cramps (ALLERGY/intolerance)--Pt avoids ALL lactose containing items including items with lactose baked in. JLS 11/25/16. Other reaction(s): Cramps (ALLERGY/intolerance) Other reaction(s): Cramps (ALLERGY/intolerance) Other reaction(s): Cramps (ALLERGY/intolerance)     Family History  Family history unknown: Yes    Prior to Admission medications   Medication Sig Start Date End Date Taking? Authorizing Provider  aspirin 325 MG EC tablet Take 325 mg by mouth every evening.   Yes [provider]  Cholecalciferol 125 MCG (5000 UT) capsule Take 5,000 Units by mouth daily.   Yes [provider]  diphenhydrAMINE (BENADRYL) 25 MG tablet Take 25 mg by mouth at bedtime.   Yes [provider]  donepezil (ARICEPT) 10 MG tablet Take 5 mg by mouth 2 (two) times daily. 0.5 Tablet in AM, 0.5 Tablet at night 10/04/16  Yes [provider]  metoprolol succinate (TOPROL-XL) 100 MG 24 hr tablet Take 100 mg by mouth daily. 07/09/07  Yes [provider]  sertraline (ZOLOFT) 100 MG tablet Take 100 mg by mouth daily. 11/01/16  Yes [provider]    Physical Exam: Vitals:   01/29/18 1752 01/29/18 2001 01/29/18 2100 01/29/18 2145  BP:  135/78 (!) 144/66 136/66  Pulse:  (!) 57 (!) 59 62  Resp:  (!) 23 (!) 25 19  Temp:      TempSrc:      SpO2:  99% 97%  98%  Weight: 74.8 kg     Height: 5\' 11"  (1.803 m)         Constitutional: Moderately built and nourished. Vitals:   01/29/18 1752 01/29/18 2001 01/29/18 2100 01/29/18 2145  BP:  135/78 (!) 144/66 136/66  Pulse:  (!) 57 (!) 59 62  Resp:  (!) 23 (!) 25 19  Temp:      TempSrc:      SpO2:  99% 97% 98%  Weight: 74.8 kg     Height: 5\' 11"  (1.803 m)      Eyes: Anicteric no pallor. ENMT: No discharge from the ears eyes nose or  mouth. Neck: No mass felt.  No neck rigidity. Respiratory: No rhonchi or crepitations. Cardiovascular: S1-S2 heard no murmurs appreciated. Abdomen: Soft nontender bowel sounds present. Musculoskeletal: Left foot swollen erythematous with left foot second toe showing edema and possible abscess.  Has good pulses. Skin: Left foot second toe looks edematous and possible abscess with erythema extending from the left foot to the mid calf. Neurologic: Alert awake oriented to his name and place.  Moves all extremities. Psychiatric: Has dementia.   Labs on Admission: I have personally reviewed following labs and imaging studies  CBC: Recent Labs  Lab 01/29/18 2003  WBC 8.9  NEUTROABS 6.3  HGB 14.0  HCT 44.1  MCV 97.4  PLT 128   Basic Metabolic Panel: Recent Labs  Lab 01/29/18 2003  NA 140  K 4.3  CL 103  CO2 25  GLUCOSE 99  BUN 23  CREATININE 1.32*  CALCIUM 9.0   GFR: Estimated Creatinine Clearance: 46.4 mL/min (A) (by C-G formula based on SCr of 1.32 mg/dL (H)). Liver Function Tests: No results for input(s): AST, ALT, ALKPHOS, BILITOT, PROT, ALBUMIN in the last 168 hours. No results for input(s): LIPASE, AMYLASE in the last 168 hours. No results for input(s): AMMONIA in the last 168 hours. Coagulation Profile: No results for input(s): INR, PROTIME in the last 168 hours. Cardiac Enzymes: No results for input(s): CKTOTAL, CKMB, CKMBINDEX, TROPONINI in the last 168 hours. BNP (last 3 results) No results for input(s): PROBNP in the last 8760 hours. HbA1C: No results for input(s): HGBA1C in the last 72 hours. CBG: No results for input(s): GLUCAP in the last 168 hours. Lipid Profile: No results for input(s): CHOL, HDL, LDLCALC, TRIG, CHOLHDL, LDLDIRECT in the last 72 hours. Thyroid Function Tests: No results for input(s): TSH, T4TOTAL, FREET4, T3FREE, THYROIDAB in the last 72 hours. Anemia Panel: No results for input(s): VITAMINB12, FOLATE, FERRITIN, TIBC, IRON, RETICCTPCT  in the last 72 hours. Urine analysis:    Component Value Date/Time   COLORURINE YELLOW 06/15/2017 1528   APPEARANCEUR HAZY (A) 06/15/2017 1528   LABSPEC 1.026 06/15/2017 1528   PHURINE 5.0 06/15/2017 1528   GLUCOSEU NEGATIVE 06/15/2017 1528   HGBUR SMALL (A) 06/15/2017 1528   BILIRUBINUR NEGATIVE 06/15/2017 1528   KETONESUR NEGATIVE 06/15/2017 1528   PROTEINUR 30 (A) 06/15/2017 1528   NITRITE NEGATIVE 06/15/2017 1528   LEUKOCYTESUR NEGATIVE 06/15/2017 1528   Sepsis Labs: @LABRCNTIP (procalcitonin:4,lacticidven:4) )No results found for this or any previous visit (from the past 240 hour(s)).   Radiological Exams on Admission: Dg Foot Complete Left  Result Date: 01/29/2018 CLINICAL DATA:  Cellulitis left foot EXAM: LEFT FOOT - COMPLETE 3+ VIEW COMPARISON:  08/09/2016 FINDINGS: Prior transmetatarsal amputation of the left 1st toe. No acute bony abnormality. No bony destruction to suggest acute osteomyelitis. Plantar calcaneal spur. Soft tissues are intact. IMPRESSION: Prior 1st toe transmetatarsal  amputation. No acute bony abnormality. Electronically Signed   By: Rolm Baptise M.D.   On: 01/29/2018 20:55      Assessment/Plan Principal Problem:   Cellulitis of left foot Active Problems:   Tachycardia   Essential hypertension   Dementia (HCC)   Cellulitis    1. Left foot second toe possible abscess and cellulitis of the left foot and mid calf of the left leg -on vancomycin.  Will get MRI.  Based on the MRI will have further plans.  Patient has had previous amputation of the left foot first toe. 2. Hypertension on metoprolol. 3. Dementia on Aricept. 4. Mildly elevated creatinine from previous.  Follow-up metabolic panel closely.   DVT prophylaxis: SCDs for now until we get MRI. Code Status: DNR. Family Communication: Patient's son-in-law. Disposition Plan: Back to the facility. Consults called: None. Admission status: Inpatient.   Rise Patience MD [facility] Pager [phone].  If 7PM-7AM, please contact night-coverage [email or web address] Password [email or web address]  01/29/2018, 10:02 PM

## 2018-01-29 NOTE — ED Provider Notes (Addendum)
[facility] DEPT Provider Note   CSN: [id number] Arrival date & time: 01/29/18  1741     History   Chief Complaint Chief Complaint  Patient presents with  . Leg Pain  . foot redness    HPI Joel Ibarra is a 83 y.o. male.  Level 5 caveat for mild dementia.  Left foot redness for several weeks.  Symptoms are worsening.  Additionally, he has a draining abscess on his second digit.  Status post amputation of digit #1 in the past.  He is not diabetic.  He was seen by his primary care doctor today and sent to the emergency department.     Past Medical History:  Diagnosis Date  . Cancer ([place])   . Dementia ([place])   . Hypertension   . Kidney stones     Patient Active Problem List   Diagnosis Date Noted  . Diarrhea 06/15/2017  . Nausea & vomiting 06/15/2017  . Tachycardia 06/15/2017    Past Surgical History:  Procedure Laterality Date  . TOE AMPUTATION          Home Medications    Prior to Admission medications   Medication Sig Start Date End Date Taking? Authorizing Provider  aspirin 325 MG EC tablet Take 325 mg by mouth every evening.   Yes [provider]  Cholecalciferol 125 MCG (5000 UT) capsule Take 5,000 Units by mouth daily.   Yes [provider]  diphenhydrAMINE (BENADRYL) 25 MG tablet Take 25 mg by mouth at bedtime.   Yes [provider]  donepezil (ARICEPT) 10 MG tablet Take 5 mg by mouth 2 (two) times daily. 0.5 Tablet in AM, 0.5 Tablet at night 10/04/16  Yes [provider]  metoprolol succinate (TOPROL-XL) 100 MG 24 hr tablet Take 100 mg by mouth daily. 07/09/07  Yes [provider]  sertraline (ZOLOFT) 100 MG tablet Take 100 mg by mouth daily. 11/01/16  Yes [provider]    Family History Family History  Family history unknown: Yes    Social History Social History   Tobacco Use  . Smoking status: Former Research scientist (life sciences)  . Smokeless tobacco: Never Used  Substance Use  Topics  . Alcohol use: No  . Drug use: Never     Allergies   Bee venom; Codeine; Daptomycin; Clindamycin/lincomycin; Lincomycin; and Lactose   Review of Systems Review of Systems  Unable to perform ROS: Dementia     Physical Exam Updated Vital Signs BP 136/66   Pulse 62   Temp 98.1 F (36.7 C) (Oral)   Resp 19   Ht 5\' 11"  (1.803 m)   Wt 74.8 kg   SpO2 98%   BMI 23.01 kg/m   Physical Exam  Constitutional: He is oriented to person, place, and time. He appears well-developed and well-nourished.  HENT:  Head: Normocephalic and atraumatic.  Eyes: Conjunctivae are normal.  Neck: Neck supple.  Cardiovascular: Normal rate and regular rhythm.  Pulmonary/Chest: Effort normal and breath sounds normal.  Abdominal: Soft. Bowel sounds are normal.  Musculoskeletal: Normal range of motion.  Neurological: He is alert and oriented to person, place, and time.  Skin:  Left foot: Erythema of the entire foot with extension to the distal tib-fib area.  There is a draining abscess on the dorsum of the second toe.  First toe amputated.  Psychiatric: He has a normal mood and affect. His behavior is normal.  Nursing note and vitals reviewed.    ED Treatments / Results  Labs (all  labs ordered are listed, but only abnormal results are displayed) Labs Reviewed  CBC WITH DIFFERENTIAL/PLATELET - Abnormal; Notable for the following components:      Result Value   Monocytes Absolute 1.3 (*)    All other components within normal limits  BASIC METABOLIC PANEL - Abnormal; Notable for the following components:   Creatinine, Ser 1.32 (*)    GFR calc non Af Amer 50 (*)    GFR calc Af Amer 58 (*)    All other components within normal limits  AEROBIC/ANAEROBIC CULTURE (SURGICAL/DEEP WOUND)    EKG None  Radiology Dg Foot Complete Left  Result Date: 01/29/2018 CLINICAL DATA:  Cellulitis left foot EXAM: LEFT FOOT - COMPLETE 3+ VIEW COMPARISON:  08/09/2016 FINDINGS: Prior transmetatarsal  amputation of the left 1st toe. No acute bony abnormality. No bony destruction to suggest acute osteomyelitis. Plantar calcaneal spur. Soft tissues are intact. IMPRESSION: Prior 1st toe transmetatarsal amputation. No acute bony abnormality. Electronically Signed   By: Rolm Baptise M.D.   On: 01/29/2018 20:55    Procedures Procedures (including critical care time)  Medications Ordered in ED Medications  sodium chloride 0.9 % bolus 500 mL (0 mLs Intravenous Stopped 01/29/18 2146)  vancomycin (VANCOCIN) IVPB 1000 mg/200 mL premix (0 mg Intravenous Stopped 01/29/18 2146)     Initial Impression / Assessment and Plan / ED Course  I have reviewed the triage vital signs and the nursing notes.  Pertinent labs & imaging results that were available during my care of the patient were reviewed by me and considered in my medical decision making (see chart for details).     Patient has cellulitis of his left foot and an abscess of his second digit.  Will initiate IV vancomycin.  Admit to general medicine.  2215: Discussed with daughter [name] at [phone] Final Clinical Impressions(s) / ED Diagnoses   Final diagnoses:  Cellulitis of left lower extremity  Abscess, toe, left    ED Discharge Orders    None       Nat Christen, MD 01/29/18 2150    Nat Christen, MD 01/29/18 2214

## 2018-01-30 ENCOUNTER — Inpatient Hospital Stay (HOSPITAL_COMMUNITY): Payer: Medicare Other

## 2018-01-30 DIAGNOSIS — L02612 Cutaneous abscess of left foot: Secondary | ICD-10-CM

## 2018-01-30 DIAGNOSIS — F0390 Unspecified dementia without behavioral disturbance: Secondary | ICD-10-CM

## 2018-01-30 DIAGNOSIS — L03116 Cellulitis of left lower limb: Secondary | ICD-10-CM

## 2018-01-30 DIAGNOSIS — M869 Osteomyelitis, unspecified: Principal | ICD-10-CM

## 2018-01-30 LAB — CBC
HCT: 41 % (ref 39.0–52.0)
HEMOGLOBIN: 13.1 g/dL (ref 13.0–17.0)
MCH: 31.6 pg (ref 26.0–34.0)
MCHC: 32 g/dL (ref 30.0–36.0)
MCV: 98.8 fL (ref 80.0–100.0)
Platelets: 145 10*3/uL — ABNORMAL LOW (ref 150–400)
RBC: 4.15 MIL/uL — AB (ref 4.22–5.81)
RDW: 12.7 % (ref 11.5–15.5)
WBC: 7.1 10*3/uL (ref 4.0–10.5)
nRBC: 0 % (ref 0.0–0.2)

## 2018-01-30 LAB — BASIC METABOLIC PANEL
ANION GAP: 10 (ref 5–15)
BUN: 21 mg/dL (ref 8–23)
CHLORIDE: 106 mmol/L (ref 98–111)
CO2: 23 mmol/L (ref 22–32)
Calcium: 8.7 mg/dL — ABNORMAL LOW (ref 8.9–10.3)
Creatinine, Ser: 1.24 mg/dL (ref 0.61–1.24)
GFR calc Af Amer: >60 mL/min (ref >60)
GFR calc non Af Amer: 54 mL/min — ABNORMAL LOW (ref >60)
Glucose, Bld: 109 mg/dL — ABNORMAL HIGH (ref 70–99)
POTASSIUM: 4.1 mmol/L (ref 3.5–5.1)
Sodium: 139 mmol/L (ref 135–145)

## 2018-01-30 MED ORDER — SODIUM CHLORIDE 0.9 % IV SOLN
INTRAVENOUS | Status: DC | PRN
  Administered 2018-01-30: 250 mL via INTRAVENOUS

## 2018-01-30 MED ORDER — SODIUM CHLORIDE 0.9 % IV SOLN
2.0000 g | Freq: Two times a day (BID) | INTRAVENOUS | Status: DC
  Administered 2018-01-30 – 2018-02-05 (×10): 2 g via INTRAVENOUS
  Filled 2018-01-30 (×13): qty 2

## 2018-01-30 MED ORDER — VANCOMYCIN HCL IN DEXTROSE 1-5 GM/200ML-% IV SOLN
1000.0000 mg | INTRAVENOUS | Status: DC
  Administered 2018-01-30 – 2018-02-03 (×5): 1000 mg via INTRAVENOUS
  Filled 2018-01-30 (×4): qty 200

## 2018-01-30 NOTE — Progress Notes (Signed)
Pharmacy Antibiotic Note  Joel Ibarra is a 83 y.o. male admitted on 01/29/2018 with cellulitis.  Pharmacy has been consulted for Vancomycin dosing.  Plan: Vancomycin 1gm iv x1, then 1gm iv q24hr  Goal AUC = 400 - 500 for all indications, except meningitis (goal AUC > 500 and Cmin 15-20 mcg/mL)   Height: 5\' 11"  (180.3 cm) Weight: 165 lb (74.8 kg) IBW/kg (Calculated) : 75.3  Temp (24hrs), Avg:97.8 F (36.6 C), Min:97.4 F (36.3 C), Max:98.1 F (36.7 C)  Recent Labs  Lab 01/29/18 2003 01/30/18 0511  WBC 8.9 7.1  CREATININE 1.32*  --     Estimated Creatinine Clearance: 46.4 mL/min (A) (by C-G formula based on SCr of 1.32 mg/dL (H)).    Allergies  Allergen Reactions  . Bee Venom Swelling  . Codeine Nausea Only, Nausea And Vomiting, Other (See Comments) and Swelling    Other Reaction: Other reaction Other reaction(s): Swelling Other reaction(s): Swelling Other reaction(s): Swelling   . Daptomycin Other (See Comments)    Pneumonitis  . Clindamycin/Lincomycin Rash  . Lincomycin Rash  . Lactose Other (See Comments) and Diarrhea    Other reaction(s): Cramps (ALLERGY/intolerance) Other reaction(s): Cramps (ALLERGY/intolerance) Other reaction(s): Cramps (ALLERGY/intolerance) Other reaction: Cramps (ALLERGY/intolerance)--Pt avoids ALL lactose containing items including items with lactose baked in. JLS 11/25/16. Other reaction(s): Cramps (ALLERGY/intolerance) Other reaction(s): Cramps (ALLERGY/intolerance) Other reaction(s): Cramps (ALLERGY/intolerance)     Antimicrobials this admission: Vancomycin 01/29/2018 >>  Dose adjustments this admission: -  Microbiology results: -  Thank you for allowing pharmacy to be a part of this patient's care.  Nani Skillern Crowford 01/30/2018 6:00 AM

## 2018-01-30 NOTE — H&P (View-Only) (Signed)
ORTHOPAEDIC CONSULTATION  REQUESTING PHYSICIAN: Modena Jansky, MD  Chief Complaint: Osteomyelitis cellulitis left foot second toe.  HPI: Joel Ibarra is a 83 y.o. male who presents with osteomyelitis swelling cellulitis  left foot second toe.  Patient has a history of previous MRSA infection status post first ray amputation in [facility].  Past Medical History:  Diagnosis Date  . Cancer ([place])   . Dementia ([place])   . Hypertension   . Kidney stones    Past Surgical History:  Procedure Laterality Date  . TOE AMPUTATION     Social History   Socioeconomic History  . Marital status: Married    Spouse name: Not on file  . Number of children: Not on file  . Years of education: Not on file  . Highest education level: Not on file  Occupational History  . Not on file  Social Needs  . Financial resource strain: Not on file  . Food insecurity:    Worry: Not on file    Inability: Not on file  . Transportation needs:    Medical: Not on file    Non-medical: Not on file  Tobacco Use  . Smoking status: Former Research scientist (life sciences)  . Smokeless tobacco: Never Used  Substance and Sexual Activity  . Alcohol use: No  . Drug use: Never  . Sexual activity: Not on file  Lifestyle  . Physical activity:    Days per week: Not on file    Minutes per session: Not on file  . Stress: Not on file  Relationships  . Social connections:    Talks on phone: Not on file    Gets together: Not on file    Attends religious service: Not on file    Active member of club or organization: Not on file    Attends meetings of clubs or organizations: Not on file    Relationship status: Not on file  Other Topics Concern  . Not on file  Social History Narrative  . Not on file   Family History  Family history unknown: Yes   - negative except otherwise stated in the family history section Allergies  Allergen Reactions  . Bee Venom Swelling  . Codeine Nausea Only, Nausea And Vomiting, Other (See  Comments) and Swelling    Other Reaction: Other reaction Other reaction(s): Swelling Other reaction(s): Swelling Other reaction(s): Swelling   . Daptomycin Other (See Comments)    Pneumonitis  . Clindamycin/Lincomycin Rash  . Lincomycin Rash  . Lactose Other (See Comments) and Diarrhea    Other reaction(s): Cramps (ALLERGY/intolerance) Other reaction(s): Cramps (ALLERGY/intolerance) Other reaction(s): Cramps (ALLERGY/intolerance) Other reaction: Cramps (ALLERGY/intolerance)--Pt avoids ALL lactose containing items including items with lactose baked in. JLS 11/25/16. Other reaction(s): Cramps (ALLERGY/intolerance) Other reaction(s): Cramps (ALLERGY/intolerance) Other reaction(s): Cramps (ALLERGY/intolerance)    Prior to Admission medications   Medication Sig Start Date End Date Taking? Authorizing Provider  aspirin 325 MG EC tablet Take 325 mg by mouth every evening.   Yes [provider]  Cholecalciferol 125 MCG (5000 UT) capsule Take 5,000 Units by mouth daily.   Yes [provider]  diphenhydrAMINE (BENADRYL) 25 MG tablet Take 25 mg by mouth at bedtime.   Yes [provider]  donepezil (ARICEPT) 10 MG tablet Take 5 mg by mouth 2 (two) times daily. 0.5 Tablet in AM, 0.5 Tablet at night 10/04/16  Yes [provider]  metoprolol succinate (TOPROL-XL) 100 MG 24 hr tablet Take 100 mg by mouth daily. 07/09/07  Yes  [provider]  sertraline (ZOLOFT) 100 MG tablet Take 100 mg by mouth daily. 11/01/16  Yes [provider]   Mr Foot Left Wo Contrast  Result Date: 01/30/2018 CLINICAL DATA:  Redness and swelling of the second toe. Evaluate for osteomyelitis. EXAM: MRI OF THE LEFT FOOT WITHOUT CONTRAST TECHNIQUE: Multiplanar, multisequence MR imaging of the left forefoot was performed. No intravenous contrast was administered. COMPARISON:  Left foot x-rays from yesterday. Left foot MRI dated November 03, 2016. FINDINGS: Bones/Joint/Cartilage Prior  first ray amputation. Small area of marrow edema at the residual first metatarsal stump. Intense marrow edema with corresponding mildly decreased T1 marrow signal involving the second toe phalanges. Remaining marrow signal is unremarkable. No fracture or dislocation. Normal alignment. No joint effusion. Small amount of fluid in the second and third intermetatarsal bursae. Ligaments Collateral ligaments are intact. Muscles and Tendons Flexor, peroneal and extensor compartment tendons are intact. Diffuse fatty atrophy of the intrinsic muscles of the forefoot. Soft tissue Diffuse soft tissue swelling of the second toe. Mild dorsal forefoot soft tissue swelling. No fluid collection or hematoma. No soft tissue mass. IMPRESSION: 1. Osteomyelitis of the second toe.  No abscess. 2. Prior first ray amputation with small area of marrow edema at the residual first metatarsal stump, favored reactive in the absence of nearby ulceration. Osteomyelitis is less likely. Electronically Signed   By: Titus Dubin M.D.   On: 01/30/2018 12:13   Dg Foot Complete Left  Result Date: 01/29/2018 CLINICAL DATA:  Cellulitis left foot EXAM: LEFT FOOT - COMPLETE 3+ VIEW COMPARISON:  08/09/2016 FINDINGS: Prior transmetatarsal amputation of the left 1st toe. No acute bony abnormality. No bony destruction to suggest acute osteomyelitis. Plantar calcaneal spur. Soft tissues are intact. IMPRESSION: Prior 1st toe transmetatarsal amputation. No acute bony abnormality. Electronically Signed   By: Rolm Baptise M.D.   On: 01/29/2018 20:55   - pertinent xrays, CT, MRI studies were reviewed and independently interpreted  Positive ROS: All other systems have been reviewed and were otherwise negative with the exception of those mentioned in the HPI and as above.  Physical Exam: General: Alert, no acute distress Psychiatric: Patient is competent for consent with normal mood and affect Lymphatic: No axillary or cervical  lymphadenopathy Cardiovascular: No pedal edema Respiratory: No cyanosis, no use of accessory musculature GI: No organomegaly, abdomen is soft and non-tender    Images:  @ENCIMAGES @  Labs:  Lab Results  Component Value Date   HGBA1C 5.5 06/15/2017    Lab Results  Component Value Date   ALBUMIN 3.7 06/15/2017   ALBUMIN 4.0 06/14/2017    Neurologic: Patient does not have protective sensation bilateral lower extremities.   MUSCULOSKELETAL:   Skin: Examination patient has sausage digit swelling of the left foot second toe he is status post a first ray amputation.  Patient has cellulitis extending to the midfoot with some mild redness ascending up the leg.  There is an ulcer over the PIP joint of the second toe.  Patient has a palpable dorsalis pedis and posterior tibial pulse.  Review of the MRI scan shows osteomyelitis of the second toe.  Assessment: Assessment: Osteomyelitis cellulitis ulceration left foot second toe.  Plan: Plan: Patient and his daughter wish to proceed with surgery as soon as possible.  I have written orders to transfer patient to [facility] with anticipated surgery tomorrow Wednesday approximately 1 PM.  Risk and benefits were discussed including the potential for an abscess extending to the midfoot.  Discussed that  we would evaluate this interoperatively.  Thank you for the consult and the opportunity to see Joel Ibarra, [place] [phone] 5:33 PM

## 2018-01-30 NOTE — Progress Notes (Signed)
Addendum  Orthopedic evaluation appreciated.  Recommend transferring patient to [facility] for possible surgery tomorrow.  Transfer paperwork completed.  Vernell Leep, MD, FACP, [facility] Pager [phone]  If 7PM-7AM, please contact night-coverage [email or web address] Password [facility] 01/30/2018, 7:48 PM

## 2018-01-30 NOTE — Consult Note (Signed)
ORTHOPAEDIC CONSULTATION  REQUESTING PHYSICIAN: Modena Jansky, MD  Chief Complaint: Osteomyelitis cellulitis left foot second toe.  HPI: Joel Ibarra is a 83 y.o. male who presents with osteomyelitis swelling cellulitis  left foot second toe.  Patient has a history of previous MRSA infection status post first ray amputation in [facility].  Past Medical History:  Diagnosis Date  . Cancer ([place])   . Dementia ([place])   . Hypertension   . Kidney stones    Past Surgical History:  Procedure Laterality Date  . TOE AMPUTATION     Social History   Socioeconomic History  . Marital status: Married    Spouse name: Not on file  . Number of children: Not on file  . Years of education: Not on file  . Highest education level: Not on file  Occupational History  . Not on file  Social Needs  . Financial resource strain: Not on file  . Food insecurity:    Worry: Not on file    Inability: Not on file  . Transportation needs:    Medical: Not on file    Non-medical: Not on file  Tobacco Use  . Smoking status: Former Research scientist (life sciences)  . Smokeless tobacco: Never Used  Substance and Sexual Activity  . Alcohol use: No  . Drug use: Never  . Sexual activity: Not on file  Lifestyle  . Physical activity:    Days per week: Not on file    Minutes per session: Not on file  . Stress: Not on file  Relationships  . Social connections:    Talks on phone: Not on file    Gets together: Not on file    Attends religious service: Not on file    Active member of club or organization: Not on file    Attends meetings of clubs or organizations: Not on file    Relationship status: Not on file  Other Topics Concern  . Not on file  Social History Narrative  . Not on file   Family History  Family history unknown: Yes   - negative except otherwise stated in the family history section Allergies  Allergen Reactions  . Bee Venom Swelling  . Codeine Nausea Only, Nausea And Vomiting, Other (See  Comments) and Swelling    Other Reaction: Other reaction Other reaction(s): Swelling Other reaction(s): Swelling Other reaction(s): Swelling   . Daptomycin Other (See Comments)    Pneumonitis  . Clindamycin/Lincomycin Rash  . Lincomycin Rash  . Lactose Other (See Comments) and Diarrhea    Other reaction(s): Cramps (ALLERGY/intolerance) Other reaction(s): Cramps (ALLERGY/intolerance) Other reaction(s): Cramps (ALLERGY/intolerance) Other reaction: Cramps (ALLERGY/intolerance)--Pt avoids ALL lactose containing items including items with lactose baked in. JLS 11/25/16. Other reaction(s): Cramps (ALLERGY/intolerance) Other reaction(s): Cramps (ALLERGY/intolerance) Other reaction(s): Cramps (ALLERGY/intolerance)    Prior to Admission medications   Medication Sig Start Date End Date Taking? Authorizing Provider  aspirin 325 MG EC tablet Take 325 mg by mouth every evening.   Yes [provider]  Cholecalciferol 125 MCG (5000 UT) capsule Take 5,000 Units by mouth daily.   Yes [provider]  diphenhydrAMINE (BENADRYL) 25 MG tablet Take 25 mg by mouth at bedtime.   Yes [provider]  donepezil (ARICEPT) 10 MG tablet Take 5 mg by mouth 2 (two) times daily. 0.5 Tablet in AM, 0.5 Tablet at night 10/04/16  Yes [provider]  metoprolol succinate (TOPROL-XL) 100 MG 24 hr tablet Take 100 mg by mouth daily. 07/09/07  Yes  [provider]  sertraline (ZOLOFT) 100 MG tablet Take 100 mg by mouth daily. 11/01/16  Yes [provider]   Mr Foot Left Wo Contrast  Result Date: 01/30/2018 CLINICAL DATA:  Redness and swelling of the second toe. Evaluate for osteomyelitis. EXAM: MRI OF THE LEFT FOOT WITHOUT CONTRAST TECHNIQUE: Multiplanar, multisequence MR imaging of the left forefoot was performed. No intravenous contrast was administered. COMPARISON:  Left foot x-rays from yesterday. Left foot MRI dated November 03, 2016. FINDINGS: Bones/Joint/Cartilage Prior  first ray amputation. Small area of marrow edema at the residual first metatarsal stump. Intense marrow edema with corresponding mildly decreased T1 marrow signal involving the second toe phalanges. Remaining marrow signal is unremarkable. No fracture or dislocation. Normal alignment. No joint effusion. Small amount of fluid in the second and third intermetatarsal bursae. Ligaments Collateral ligaments are intact. Muscles and Tendons Flexor, peroneal and extensor compartment tendons are intact. Diffuse fatty atrophy of the intrinsic muscles of the forefoot. Soft tissue Diffuse soft tissue swelling of the second toe. Mild dorsal forefoot soft tissue swelling. No fluid collection or hematoma. No soft tissue mass. IMPRESSION: 1. Osteomyelitis of the second toe.  No abscess. 2. Prior first ray amputation with small area of marrow edema at the residual first metatarsal stump, favored reactive in the absence of nearby ulceration. Osteomyelitis is less likely. Electronically Signed   By: Titus Dubin M.D.   On: 01/30/2018 12:13   Dg Foot Complete Left  Result Date: 01/29/2018 CLINICAL DATA:  Cellulitis left foot EXAM: LEFT FOOT - COMPLETE 3+ VIEW COMPARISON:  08/09/2016 FINDINGS: Prior transmetatarsal amputation of the left 1st toe. No acute bony abnormality. No bony destruction to suggest acute osteomyelitis. Plantar calcaneal spur. Soft tissues are intact. IMPRESSION: Prior 1st toe transmetatarsal amputation. No acute bony abnormality. Electronically Signed   By: Rolm Baptise M.D.   On: 01/29/2018 20:55   - pertinent xrays, CT, MRI studies were reviewed and independently interpreted  Positive ROS: All other systems have been reviewed and were otherwise negative with the exception of those mentioned in the HPI and as above.  Physical Exam: General: Alert, no acute distress Psychiatric: Patient is competent for consent with normal mood and affect Lymphatic: No axillary or cervical  lymphadenopathy Cardiovascular: No pedal edema Respiratory: No cyanosis, no use of accessory musculature GI: No organomegaly, abdomen is soft and non-tender    Images:  @ENCIMAGES @  Labs:  Lab Results  Component Value Date   HGBA1C 5.5 06/15/2017    Lab Results  Component Value Date   ALBUMIN 3.7 06/15/2017   ALBUMIN 4.0 06/14/2017    Neurologic: Patient does not have protective sensation bilateral lower extremities.   MUSCULOSKELETAL:   Skin: Examination patient has sausage digit swelling of the left foot second toe he is status post a first ray amputation.  Patient has cellulitis extending to the midfoot with some mild redness ascending up the leg.  There is an ulcer over the PIP joint of the second toe.  Patient has a palpable dorsalis pedis and posterior tibial pulse.  Review of the MRI scan shows osteomyelitis of the second toe.  Assessment: Assessment: Osteomyelitis cellulitis ulceration left foot second toe.  Plan: Plan: Patient and his daughter wish to proceed with surgery as soon as possible.  I have written orders to transfer patient to [facility] with anticipated surgery tomorrow Wednesday approximately 1 PM.  Risk and benefits were discussed including the potential for an abscess extending to the midfoot.  Discussed that  we would evaluate this interoperatively.  Thank you for the consult and the opportunity to see Mr. Arkin Imran, [place] [phone] 5:33 PM

## 2018-01-30 NOTE — Progress Notes (Signed)
PROGRESS NOTE   Jr Milliron  AJO:[id number]    DOB: [birth year]    DOA: 01/29/2018  PCP: System, Pcp Not In   I have briefly reviewed patients [facility].  Brief Narrative:  83 year old married male with PMH of dementia, HTN, prior left great toe amputation by orthopedics in [place] for?  MRSA osteomyelitis, presented to the ED due to progressive worsening erythema and swelling of left foot second toe and progressively involving the fourth and lower leg.  Admitted for left foot second toe cellulitis and osteomyelitis.  Consulted orthopedics.   Assessment & Plan:   Principal Problem:   Cellulitis of left foot Active Problems:   Tachycardia   Essential hypertension   Dementia (HCC)   Cellulitis   Osteomyelitis of left second toe with cellulitis: X-ray of left foot showed no acute bony abnormality.  However MRI of left foot showed osteomyelitis of the second toe without abscess.  Had been empirically started on IV vancomycin.  Added IV cefepime.  Consulted orthopedic/Dr. Sharol Given who will see him later today.  Essential hypertension: Controlled.  Continue metoprolol.  Dementia: Moderate.  No behavioral abnormalities.  Continue Aricept.  Mild thrombocytopenia:?  Related to antibiotics.  Follow CBCs.  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  Presented with creatinine of 1.32.  Hydrated with IV fluids.  Improved to 1.24.  Follow BMP in a.m.  DVT prophylaxis: SCDs Code Status: DNR Family Communication: Discussed in detail with patient's daughter, updated care and answered questions. Disposition: To be determined pending clinical improvement.   Consultants:  Orthopedics  Procedures:  None  Antimicrobials:  IV vancomycin and cefepime.   Subjective: Poor historian.  States that he feels okay.  Denies pain in his left foot.  No drainage.  Denies fever or chills.  ROS: As above.  Otherwise  negative or unable to obtain.  Objective:  Vitals:   01/29/18 2252 01/29/18 2327 01/30/18 0526 01/30/18 1312  BP: 115/65 135/64 (!) 143/72 131/63  Pulse: (!) 58 (!) 58 (!) 49 (!) 51  Resp: (!) 24 20 20 20   Temp:  98 F (36.7 C) (!) 97.4 F (36.3 C) 98 F (36.7 C)  TempSrc:  Oral Oral Oral  SpO2: 97% 97% 98% 98%  Weight:      Height:        Examination:  General exam: Pleasant elderly male, moderately built and nourished lying comfortably propped up in bed.  Does not look septic or toxic. Respiratory system: Clear to auscultation. Respiratory effort normal. Cardiovascular system: S1 & S2 heard, RRR. No JVD, murmurs, rubs, gallops or clicks. No pedal edema. Gastrointestinal system: Abdomen is nondistended, soft and nontender. No organomegaly or masses felt. Normal bowel sounds heard. Central nervous system: Alert and oriented x2. No focal neurological deficits. Extremities: Symmetric 5 x 5 power.  Healed left first toe amputation site.  Left second toe swollen, red, mild fluctuation on the dorsal aspect of left second toe, patchy redness extending onto the dorsum of left forefoot and a little bit on the plantar aspect.  No tenderness.  Mild increase in warmth.  Has a small ulcer at the ball of the left second toe which is clean.  Please see pictures from 12/3 Skin: As noted above. Psychiatry: Judgement and insight impaired. Mood & affect pleasant and appropriate.         Data Reviewed: I have personally reviewed following labs and imaging studies  CBC: Recent Labs  Lab 01/29/18 2003 01/30/18 0511  WBC 8.9 7.1  NEUTROABS 6.3  --   HGB 14.0 13.1  HCT 44.1 41.0  MCV 97.4 98.8  PLT 164 465*   Basic Metabolic Panel: Recent Labs  Lab 01/29/18 2003 01/30/18 0511  NA 140 139  K 4.3 4.1  CL 103 106  CO2 25 23  GLUCOSE 99 109*  BUN 23 21  CREATININE 1.32* 1.24  CALCIUM 9.0 8.7*     Radiology Studies: Mr Foot Left Wo Contrast  Result Date: 01/30/2018 CLINICAL  DATA:  Redness and swelling of the second toe. Evaluate for osteomyelitis. EXAM: MRI OF THE LEFT FOOT WITHOUT CONTRAST TECHNIQUE: Multiplanar, multisequence MR imaging of the left forefoot was performed. No intravenous contrast was administered. COMPARISON:  Left foot x-rays from yesterday. Left foot MRI dated November 03, 2016. FINDINGS: Bones/Joint/Cartilage Prior first ray amputation. Small area of marrow edema at the residual first metatarsal stump. Intense marrow edema with corresponding mildly decreased T1 marrow signal involving the second toe phalanges. Remaining marrow signal is unremarkable. No fracture or dislocation. Normal alignment. No joint effusion. Small amount of fluid in the second and third intermetatarsal bursae. Ligaments Collateral ligaments are intact. Muscles and Tendons Flexor, peroneal and extensor compartment tendons are intact. Diffuse fatty atrophy of the intrinsic muscles of the forefoot. Soft tissue Diffuse soft tissue swelling of the second toe. Mild dorsal forefoot soft tissue swelling. No fluid collection or hematoma. No soft tissue mass. IMPRESSION: 1. Osteomyelitis of the second toe.  No abscess. 2. Prior first ray amputation with small area of marrow edema at the residual first metatarsal stump, favored reactive in the absence of nearby ulceration. Osteomyelitis is less likely. Electronically Signed   By: Titus Dubin M.D.   On: 01/30/2018 12:13   Dg Foot Complete Left  Result Date: 01/29/2018 CLINICAL DATA:  Cellulitis left foot EXAM: LEFT FOOT - COMPLETE 3+ VIEW COMPARISON:  08/09/2016 FINDINGS: Prior transmetatarsal amputation of the left 1st toe. No acute bony abnormality. No bony destruction to suggest acute osteomyelitis. Plantar calcaneal spur. Soft tissues are intact. IMPRESSION: Prior 1st toe transmetatarsal amputation. No acute bony abnormality. Electronically Signed   By: Rolm Baptise M.D.   On: 01/29/2018 20:55        Scheduled Meds: . aspirin  325 mg  Oral QPM  . cholecalciferol  5,000 Units Oral Daily  . diphenhydrAMINE  25 mg Oral QHS  . donepezil  5 mg Oral BID  . metoprolol succinate  100 mg Oral Daily  . sertraline  100 mg Oral Daily   Continuous Infusions: . sodium chloride 250 mL (01/30/18 1027)  . ceFEPime (MAXIPIME) IV    . vancomycin 1,000 mg (01/30/18 1031)     LOS: 1 day     Vernell Leep, MD, FACP, [facility]. [facility] Pager [phone]  If 7PM-7AM, please contact night-coverage [email or web address] Password [email or web address] 01/30/2018, 2:35 PM

## 2018-01-30 NOTE — Progress Notes (Signed)
Pharmacy Antibiotic Note  Joel Ibarra is a 83 y.o. male admitted on 01/29/2018 with cellulitis.  Pharmacy has been consulted for Vancomycin dosing.   01/30/18  MRI resulted with OM of second toe without abscess  Afebrile, WBC WNL  Plan: Vancomycin 1gm iv x1, then 1gm iv q24hr  Goal AUC = 400 - 500 for all indications, except meningitis (goal AUC > 500 and Cmin 15-20 mcg/mL)  Add cefepime 2 g iv q 12 hours.   F/U renal function and culture results   Height: 5\' 11"  (180.3 cm) Weight: 165 lb (74.8 kg) IBW/kg (Calculated) : 75.3  Temp (24hrs), Avg:97.9 F (36.6 C), Min:97.4 F (36.3 C), Max:98.1 F (36.7 C)  Recent Labs  Lab 01/29/18 2003 01/30/18 0511  WBC 8.9 7.1  CREATININE 1.32* 1.24    Estimated Creatinine Clearance: 49.4 mL/min (by C-G formula based on SCr of 1.24 mg/dL).    Allergies  Allergen Reactions  . Bee Venom Swelling  . Codeine Nausea Only, Nausea And Vomiting, Other (See Comments) and Swelling    Other Reaction: Other reaction Other reaction(s): Swelling Other reaction(s): Swelling Other reaction(s): Swelling   . Daptomycin Other (See Comments)    Pneumonitis  . Clindamycin/Lincomycin Rash  . Lincomycin Rash  . Lactose Other (See Comments) and Diarrhea    Other reaction(s): Cramps (ALLERGY/intolerance) Other reaction(s): Cramps (ALLERGY/intolerance) Other reaction(s): Cramps (ALLERGY/intolerance) Other reaction: Cramps (ALLERGY/intolerance)--Pt avoids ALL lactose containing items including items with lactose baked in. JLS 11/25/16. Other reaction(s): Cramps (ALLERGY/intolerance) Other reaction(s): Cramps (ALLERGY/intolerance) Other reaction(s): Cramps (ALLERGY/intolerance)     Antimicrobials this admission: Vancomycin 01/29/2018 >> Cefepime 01/30/18 >>  Dose adjustments this admission: -  Microbiology results: Wound cx:   Thank you for allowing pharmacy to be a part of this patient's care.  Ulice Dash D 01/30/2018 2:35 PM

## 2018-01-31 ENCOUNTER — Inpatient Hospital Stay (HOSPITAL_COMMUNITY): Payer: Medicare Other | Admitting: Anesthesiology

## 2018-01-31 ENCOUNTER — Encounter (HOSPITAL_COMMUNITY)
Admission: EM | Disposition: A | Discharge status: Skilled Nursing Facility | Payer: Self-pay | Source: Home / Self Care | Attending: Internal Medicine

## 2018-01-31 DIAGNOSIS — R000 Tachycardia, unspecified: Secondary | ICD-10-CM

## 2018-01-31 DIAGNOSIS — D696 Thrombocytopenia, unspecified: Secondary | ICD-10-CM

## 2018-01-31 DIAGNOSIS — N189 Chronic kidney disease, unspecified: Secondary | ICD-10-CM

## 2018-01-31 DIAGNOSIS — N179 Acute kidney failure, unspecified: Secondary | ICD-10-CM

## 2018-01-31 DIAGNOSIS — R739 Hyperglycemia, unspecified: Secondary | ICD-10-CM

## 2018-01-31 DIAGNOSIS — L03032 Cellulitis of left toe: Secondary | ICD-10-CM

## 2018-01-31 HISTORY — PX: AMPUTATION: SHX166

## 2018-01-31 LAB — BASIC METABOLIC PANEL
Anion gap: 9 (ref 5–15)
BUN: 22 mg/dL (ref 8–23)
CO2: 24 mmol/L (ref 22–32)
Calcium: 8.9 mg/dL (ref 8.9–10.3)
Chloride: 105 mmol/L (ref 98–111)
Creatinine, Ser: 1.24 mg/dL (ref 0.61–1.24)
GFR calc Af Amer: >60 mL/min (ref >60)
GFR calc non Af Amer: 54 mL/min — ABNORMAL LOW (ref >60)
GLUCOSE: 113 mg/dL — AB (ref 70–99)
Potassium: 4.7 mmol/L (ref 3.5–5.1)
Sodium: 138 mmol/L (ref 135–145)

## 2018-01-31 LAB — CBC
HCT: 43.2 % (ref 39.0–52.0)
Hemoglobin: 14 g/dL (ref 13.0–17.0)
MCH: 31.8 pg (ref 26.0–34.0)
MCHC: 32.4 g/dL (ref 30.0–36.0)
MCV: 98.2 fL (ref 80.0–100.0)
PLATELETS: 145 10*3/uL — AB (ref 150–400)
RBC: 4.4 MIL/uL (ref 4.22–5.81)
RDW: 12.4 % (ref 11.5–15.5)
WBC: 6.7 10*3/uL (ref 4.0–10.5)
nRBC: 0 % (ref 0.0–0.2)

## 2018-01-31 SURGERY — AMPUTATION, FOOT, RAY
Anesthesia: Monitor Anesthesia Care | Site: Toe | Laterality: Left

## 2018-01-31 MED ORDER — BISACODYL 10 MG RE SUPP: 10.0000 mg | Freq: Every day | RECTAL | Status: DC | PRN

## 2018-01-31 MED ORDER — OXYCODONE HCL 5 MG PO TABS: 5.0000 mg | ORAL_TABLET | Freq: Once | ORAL | Status: DC | PRN

## 2018-01-31 MED ORDER — MORPHINE SULFATE (PF) 2 MG/ML IV SOLN: 0.5000 mg | INTRAVENOUS | Status: DC | PRN

## 2018-01-31 MED ORDER — METHOCARBAMOL 500 MG PO TABS: 500.0000 mg | ORAL_TABLET | Freq: Four times a day (QID) | ORAL | Status: DC | PRN

## 2018-01-31 MED ORDER — METOCLOPRAMIDE HCL 5 MG PO TABS: 5.0000 mg | ORAL_TABLET | Freq: Three times a day (TID) | ORAL | Status: DC | PRN

## 2018-01-31 MED ORDER — METOCLOPRAMIDE HCL 5 MG/ML IJ SOLN: 5.0000 mg | Freq: Three times a day (TID) | INTRAMUSCULAR | Status: DC | PRN

## 2018-01-31 MED ORDER — MAGNESIUM CITRATE PO SOLN: 1.0000 | Freq: Once | ORAL | Status: DC | PRN

## 2018-01-31 MED ORDER — HYDROCODONE-ACETAMINOPHEN 5-325 MG PO TABS: 1.0000 | ORAL_TABLET | ORAL | Status: DC | PRN

## 2018-01-31 MED ORDER — DOCUSATE SODIUM 100 MG PO CAPS
100.0000 mg | ORAL_CAPSULE | Freq: Two times a day (BID) | ORAL | Status: DC
  Administered 2018-01-31 – 2018-02-05 (×10): 100 mg via ORAL
  Filled 2018-01-31 (×10): qty 1

## 2018-01-31 MED ORDER — POVIDONE-IODINE 10 % EX SWAB: 2.0000 "application " | Freq: Once | CUTANEOUS | Status: DC

## 2018-01-31 MED ORDER — CHLORHEXIDINE GLUCONATE 4 % EX LIQD
60.0000 mL | Freq: Once | CUTANEOUS | Status: DC
  Filled 2018-01-31: qty 60

## 2018-01-31 MED ORDER — LIDOCAINE 2% (20 MG/ML) 5 ML SYRINGE
INTRAMUSCULAR | Status: AC
  Filled 2018-01-31: qty 5

## 2018-01-31 MED ORDER — CEFAZOLIN SODIUM 1 G IJ SOLR
INTRAMUSCULAR | Status: AC
  Filled 2018-01-31: qty 20

## 2018-01-31 MED ORDER — FENTANYL CITRATE (PF) 100 MCG/2ML IJ SOLN
50.0000 ug | Freq: Once | INTRAMUSCULAR | Status: AC
  Administered 2018-01-31: 50 ug via INTRAVENOUS

## 2018-01-31 MED ORDER — POLYETHYLENE GLYCOL 3350 17 G PO PACK: 17.0000 g | PACK | Freq: Every day | ORAL | Status: DC | PRN

## 2018-01-31 MED ORDER — PROMETHAZINE HCL 25 MG/ML IJ SOLN: 6.2500 mg | INTRAMUSCULAR | Status: DC | PRN

## 2018-01-31 MED ORDER — MIDAZOLAM HCL 2 MG/2ML IJ SOLN
1.0000 mg | Freq: Once | INTRAMUSCULAR | Status: AC
  Administered 2018-01-31: 1 mg via INTRAVENOUS

## 2018-01-31 MED ORDER — SODIUM CHLORIDE 0.9 % IV SOLN
INTRAVENOUS | Status: DC
  Administered 2018-02-01 (×2): via INTRAVENOUS

## 2018-01-31 MED ORDER — PROPOFOL 10 MG/ML IV BOLUS
INTRAVENOUS | Status: AC
  Filled 2018-01-31: qty 20

## 2018-01-31 MED ORDER — EPHEDRINE SULFATE 50 MG/ML IJ SOLN
INTRAMUSCULAR | Status: DC | PRN
  Administered 2018-01-31: 5 mg via INTRAVENOUS
  Administered 2018-01-31: 10 mg via INTRAVENOUS

## 2018-01-31 MED ORDER — MIDAZOLAM HCL 2 MG/2ML IJ SOLN
INTRAMUSCULAR | Status: AC
  Administered 2018-01-31: 1 mg via INTRAVENOUS
  Filled 2018-01-31: qty 2

## 2018-01-31 MED ORDER — LACTATED RINGERS IV SOLN
INTRAVENOUS | Status: DC
  Administered 2018-01-31: 11:00:00 via INTRAVENOUS

## 2018-01-31 MED ORDER — 0.9 % SODIUM CHLORIDE (POUR BTL) OPTIME
TOPICAL | Status: DC | PRN
  Administered 2018-01-31: 1000 mL

## 2018-01-31 MED ORDER — HYDROMORPHONE HCL 1 MG/ML IJ SOLN: 0.2500 mg | INTRAMUSCULAR | Status: DC | PRN

## 2018-01-31 MED ORDER — ACETAMINOPHEN 325 MG PO TABS: 325.0000 mg | ORAL_TABLET | Freq: Four times a day (QID) | ORAL | Status: DC | PRN

## 2018-01-31 MED ORDER — ROPIVACAINE HCL 5 MG/ML IJ SOLN
INTRAMUSCULAR | Status: DC | PRN
  Administered 2018-01-31: 30 mL via EPIDURAL

## 2018-01-31 MED ORDER — PHENYLEPHRINE 40 MCG/ML (10ML) SYRINGE FOR IV PUSH (FOR BLOOD PRESSURE SUPPORT)
PREFILLED_SYRINGE | INTRAVENOUS | Status: AC
  Filled 2018-01-31: qty 10

## 2018-01-31 MED ORDER — FENTANYL CITRATE (PF) 100 MCG/2ML IJ SOLN
INTRAMUSCULAR | Status: AC
  Administered 2018-01-31: 50 ug via INTRAVENOUS
  Filled 2018-01-31: qty 2

## 2018-01-31 MED ORDER — PROPOFOL 500 MG/50ML IV EMUL
INTRAVENOUS | Status: DC | PRN
  Administered 2018-01-31: 50 ug/kg/min via INTRAVENOUS

## 2018-01-31 MED ORDER — FENTANYL CITRATE (PF) 250 MCG/5ML IJ SOLN
INTRAMUSCULAR | Status: AC
  Filled 2018-01-31: qty 5

## 2018-01-31 MED ORDER — ONDANSETRON HCL 4 MG PO TABS: 4.0000 mg | ORAL_TABLET | Freq: Four times a day (QID) | ORAL | Status: DC | PRN

## 2018-01-31 MED ORDER — LIDOCAINE 2% (20 MG/ML) 5 ML SYRINGE
INTRAMUSCULAR | Status: AC
  Filled 2018-01-31: qty 10

## 2018-01-31 MED ORDER — OXYCODONE HCL 5 MG/5ML PO SOLN: 5.0000 mg | Freq: Once | ORAL | Status: DC | PRN

## 2018-01-31 MED ORDER — ONDANSETRON HCL 4 MG/2ML IJ SOLN: 4.0000 mg | Freq: Four times a day (QID) | INTRAMUSCULAR | Status: DC | PRN

## 2018-01-31 MED ORDER — HYDROCODONE-ACETAMINOPHEN 7.5-325 MG PO TABS: 1.0000 | ORAL_TABLET | ORAL | Status: DC | PRN

## 2018-01-31 MED ORDER — METHOCARBAMOL 1000 MG/10ML IJ SOLN
500.0000 mg | Freq: Four times a day (QID) | INTRAVENOUS | Status: DC | PRN
  Filled 2018-01-31: qty 5

## 2018-01-31 SURGICAL SUPPLY — 30 items
BLADE SAW SGTL MED 73X18.5 STR (BLADE) IMPLANT
BLADE SURG 21 STRL SS (BLADE) ×3 IMPLANT
BNDG COHESIVE 4X5 TAN STRL (GAUZE/BANDAGES/DRESSINGS) ×3 IMPLANT
BNDG GAUZE ELAST 4 BULKY (GAUZE/BANDAGES/DRESSINGS) ×3 IMPLANT
COVER SURGICAL LIGHT HANDLE (MISCELLANEOUS) ×6 IMPLANT
COVER WAND RF STERILE (DRAPES) ×3 IMPLANT
DRAPE U-SHAPE 47X51 STRL (DRAPES) ×6 IMPLANT
DRSG ADAPTIC 3X8 NADH LF (GAUZE/BANDAGES/DRESSINGS) ×3 IMPLANT
DRSG PAD ABDOMINAL 8X10 ST (GAUZE/BANDAGES/DRESSINGS) ×6 IMPLANT
DURAPREP 26ML APPLICATOR (WOUND CARE) ×3 IMPLANT
ELECT REM PT RETURN 9FT ADLT (ELECTROSURGICAL) ×3
ELECTRODE REM PT RTRN 9FT ADLT (ELECTROSURGICAL) ×1 IMPLANT
GAUZE SPONGE 4X4 12PLY STRL (GAUZE/BANDAGES/DRESSINGS) ×3 IMPLANT
GLOVE BIOGEL PI IND STRL 9 (GLOVE) ×1 IMPLANT
GLOVE BIOGEL PI INDICATOR 9 (GLOVE) ×2
GLOVE SURG ORTHO 9.0 STRL STRW (GLOVE) ×3 IMPLANT
GOWN STRL REUS W/ TWL XL LVL3 (GOWN DISPOSABLE) ×2 IMPLANT
GOWN STRL REUS W/TWL XL LVL3 (GOWN DISPOSABLE) ×4
KIT BASIN OR (CUSTOM PROCEDURE TRAY) ×3 IMPLANT
KIT TURNOVER KIT B (KITS) ×3 IMPLANT
NS IRRIG 1000ML POUR BTL (IV SOLUTION) ×3 IMPLANT
PACK ORTHO EXTREMITY (CUSTOM PROCEDURE TRAY) ×3 IMPLANT
PAD ABD 8X10 STRL (GAUZE/BANDAGES/DRESSINGS) ×2 IMPLANT
PAD ARMBOARD 7.5X6 YLW CONV (MISCELLANEOUS) ×6 IMPLANT
STOCKINETTE IMPERVIOUS LG (DRAPES) IMPLANT
SUT ETHILON 2 0 PSLX (SUTURE) ×3 IMPLANT
TOWEL OR 17X26 10 PK STRL BLUE (TOWEL DISPOSABLE) ×3 IMPLANT
TUBE CONNECTING 12'X1/4 (SUCTIONS) ×1
TUBE CONNECTING 12X1/4 (SUCTIONS) ×2 IMPLANT
YANKAUER SUCT BULB TIP NO VENT (SUCTIONS) ×3 IMPLANT

## 2018-01-31 NOTE — Anesthesia Postprocedure Evaluation (Signed)
Anesthesia Post Note  Patient: Joel Ibarra  Procedure(s) Performed: LEFT FOOT SECOND TOE AMPUTATION (Left Toe)     Patient location during evaluation: PACU Anesthesia Type: Regional Level of consciousness: awake and alert Pain management: pain level controlled Vital Signs Assessment: post-procedure vital signs reviewed and stable Respiratory status: spontaneous breathing, nonlabored ventilation and respiratory function stable Cardiovascular status: stable and blood pressure returned to baseline Postop Assessment: no apparent nausea or vomiting Anesthetic complications: no    Last Vitals:  Vitals:   01/31/18 1436 01/31/18 1451  BP: 115/80 127/65  Pulse: (!) 52 (!) 51  Resp: 18 18  Temp:    SpO2: 99% 96%    Last Pain:  Vitals:   01/31/18 1435  TempSrc:   PainSc: 0-No pain                 Lynda Rainwater

## 2018-01-31 NOTE — Interval H&P Note (Signed)
History and Physical Interval Note:  01/31/2018 7:28 AM  Joel Ibarra  has presented today for surgery, with the diagnosis of cellulitis of left lower extrimity  The various methods of treatment have been discussed with the patient and family. After consideration of risks, benefits and other options for treatment, the patient has consented to  Procedure(s): LEFT FOOT SECOND TOE AMPUTATION (Left) as a surgical intervention .  The patient's history has been reviewed, patient examined, no change in status, stable for surgery.  I have reviewed the patient's chart and labs.  Questions were answered to the patient's satisfaction.     Newt Minion

## 2018-01-31 NOTE — Anesthesia Procedure Notes (Addendum)
Procedure Name: [place] Performed by: Lynda Rainwater, MD Pre-anesthesia Checklist: Patient identified, Emergency Drugs available, Suction available and Patient being monitored Oxygen Delivery Method: Simple face mask Induction Type: IV induction Placement Confirmation: positive ETCO2 Dental Injury: Teeth and Oropharynx as per pre-operative assessment

## 2018-01-31 NOTE — Progress Notes (Signed)
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  He is admitted for left foot second toe cellulitis and osteomyelitis and orthopedic surgery was consulted and recommending amputation and surgery to be done at [facility] at 1 PM on 01/31/2018.  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 -MRI of left foot showed osteomyelitis of the second toe without abscess -Currently n.p.o. except for meds and sips -Continue empiric antibiotic coverage with IV vancomycin and IV cefepime -Orthopedic surgery involved and patient is to go for surgical intervention and amputation of the second great toe later on today -Patient is currently being transferred to [facility] -Patient feels that the erythema has improved somewhat -Continue with antiemetics -Pain control with acetaminophen 650 mill grams p.o. every 6 PRN for mild pain  HTN -Blood pressure this AM was 144/67 -Continue metoprolol succinate 100 mg p.o. Daily -Continue to Monitor Vital Signs per Protocol  Dementia -Continue  donepezil 5 mg p.o. twice daily -Currently has no behavioral abnormalities but is very forgetful  Thrombocytopenia -Mild -Questionably related to antibiotics versus infection -Patient's platelet count went from 164 and now 145 -Continue to monitor for signs and symptoms of bleeding and closely monitor as patient is on aspirin 325 mg p.o. daily -Repeat CBC in a.m.  Acute on stage II chronic kidney disease:  -Baseline creatinine probably in the 1.1 range. -Presented with creatinine of 1.32.  Hydrated with IV fluids.   -Improved to 1.24.   -Avoid nephrotoxic medications if possible and contrast agents and repeat CMP in a.m.  Hyperglycemia -Likely reactive to pain and infection -Patient's glucose level on daily BMP/CMP is ranged from 99-113 -Check hemoglobin A1c in the a.m. -If necessary will place on sensitive NovoLog sliding scale insulin AC   DVT prophylaxis: SCDs Code Status: DO NOT RESUSCITATE Family Communication: No family present at bedside Disposition Plan: [facility] to [facility]  Consultants:   Orthopedic Surgery   Procedures: None   Antimicrobials:  Anti-infectives (From admission, onward)   Start     Dose/Rate Route Frequency Ordered Stop   01/30/18 1500  ceFEPIme (MAXIPIME) 2 g in sodium chloride 0.9 % 100 mL IVPB     2 g 200 mL/hr over 30 Minutes Intravenous Every 12 hours 01/30/18 1435     01/30/18 1000  vancomycin (VANCOCIN) IVPB 1000 mg/200 mL premix     1,000 mg 200 mL/hr over 60 Minutes Intravenous Every 24 hours 01/30/18 0600     01/29/18 2000  vancomycin (VANCOCIN) IVPB 1000 mg/200 mL premix     1,000 mg 200 mL/hr over 60 Minutes Intravenous  Once 01/29/18 1958 01/29/18 2146  Subjective: Seen and examined at bedside and is doing relatively well today.  States the redness in his left foot has decreased.  No chest pain, lightheadedness or dizziness.  Awaiting surgical intervention and states that he has been waiting for  almost a day when to be transferred to [facility].  No other concerns or complaints at this time  Objective: Vitals:   01/30/18 1312 01/30/18 2044 01/30/18 2045 01/31/18 0459  BP: 131/63 136/63  (!) 144/67  Pulse: (!) 51 (!) 53  (!) 53  Resp: 20 20  20   Temp: 98 F (36.7 C) 97.8 F (36.6 C)  97.8 F (36.6 C)  TempSrc: Oral Oral  Oral  SpO2: 98% (!) 87% 96% 95%  Weight:      Height:        Intake/Output Summary (Last 24 hours) at 01/31/2018 1114 Last data filed at 01/31/2018 4765 Gross per 24 hour  Intake 968.64 ml  Output 650 ml  Net 318.64 ml   Filed Weights   01/29/18 1751 01/29/18 1752  Weight: 72.6 kg 74.8 kg   Examination: Physical Exam:  Constitutional: WN/WD Caucasian male NAD and appears calm and comfortable but is slightly demented Eyes: Lids and conjunctivae normal, sclerae anicteric  ENMT: External Ears, Nose appear normal. Grossly normal hearing. mal, supple, no cervical masses, normal ROM, no appreciable thyromegaly; no JVD Respiratory: Clear to auscultation bilaterally, no wheezing, rales, rhonchi or crackles. Normal respiratory effort and patient is not tachypenic. No accessory muscle use.   Cardiovascular: RRR, no murmurs / rubs / gallops. S1 and S2 auscultated. Has some Pedal edema associated with erythema and LLE mild edema Abdomen: Soft, non-tender, non-distended. No masses palpated. No appreciable hepatosplenomegaly. Bowel sounds positive x4.  GU: Deferred. Musculoskeletal: Left First Toe Amputation .  Skin:  Has an ulcerated Left Second Toe and Left Foot Dorsal Erythema and swelling  Neurologic: CN 2-12 grossly intact with no focal deficits. Romberg sign and cerebellar reflexes not assessed.  Psychiatric: Normal judgment and insight. Alert and oriented x 3. Normal mood and appropriate affect.   Data Reviewed: I have personally reviewed following labs and imaging studies  CBC: Recent Labs  Lab 01/29/18 2003 01/30/18 0511 01/31/18 0513  WBC 8.9 7.1  6.7  NEUTROABS 6.3  --   --   HGB 14.0 13.1 14.0  HCT 44.1 41.0 43.2  MCV 97.4 98.8 98.2  PLT 164 145* 465*   Basic Metabolic Panel: Recent Labs  Lab 01/29/18 2003 01/30/18 0511 01/31/18 0513  NA 140 139 138  K 4.3 4.1 4.7  CL 103 106 105  CO2 25 23 24   GLUCOSE 99 109* 113*  BUN 23 21 22   CREATININE 1.32* 1.24 1.24  CALCIUM 9.0 8.7* 8.9   GFR: Estimated Creatinine Clearance: 49.4 mL/min (by C-G formula based on SCr of 1.24 mg/dL). Liver Function Tests: No results for input(s): AST, ALT, ALKPHOS, BILITOT, PROT, ALBUMIN in the last 168 hours. No results for input(s): LIPASE, AMYLASE in the last 168 hours. No results for input(s): AMMONIA in the last 168 hours. Coagulation Profile: No results for input(s): INR, PROTIME in the last 168 hours. Cardiac Enzymes: No results for input(s): CKTOTAL, CKMB, CKMBINDEX, TROPONINI in the last 168 hours. BNP (last 3 results) No results for input(s): PROBNP in the last 8760 hours. HbA1C: No results for input(s): HGBA1C in the last 72 hours. CBG: No results for input(s): GLUCAP in the last 168 hours. Lipid Profile: No results for input(s): CHOL, HDL, LDLCALC, TRIG, CHOLHDL,  LDLDIRECT in the last 72 hours. Thyroid Function Tests: No results for input(s): TSH, T4TOTAL, FREET4, T3FREE, THYROIDAB in the last 72 hours. Anemia Panel: No results for input(s): VITAMINB12, FOLATE, FERRITIN, TIBC, IRON, RETICCTPCT in the last 72 hours. Sepsis Labs: No results for input(s): PROCALCITON, LATICACIDVEN in the last 168 hours.  Recent Results (from the past 240 hour(s))  Aerobic Culture (superficial specimen)     Status: None (Preliminary result)   Collection Time: 01/30/18  2:58 AM  Result Value Ref Range Status   Specimen Description   Final    ABSCESS Performed at [facility] [address], [place], [place] [address]    Special Requests   Final    NONE Performed at [facility], [place] [address]  [place], [place] [address]    Gram Stain   Final    RARE WBC PRESENT, PREDOMINANTLY MONONUCLEAR NO ORGANISMS SEEN    Culture   Final    NO GROWTH < 12 HOURS Performed at [place] [address], [place], [place] [address]    Report Status PENDING  Incomplete   Radiology Studies: Mr Foot Left Wo Contrast  Result Date: 01/30/2018 CLINICAL DATA:  Redness and swelling of the second toe. Evaluate for osteomyelitis. EXAM: MRI OF THE LEFT FOOT WITHOUT CONTRAST TECHNIQUE: Multiplanar, multisequence MR imaging of the left forefoot was performed. No intravenous contrast was administered. COMPARISON:  Left foot x-rays from yesterday. Left foot MRI dated November 03, 2016. FINDINGS: Bones/Joint/Cartilage Prior first ray amputation. Small area of marrow edema at the residual first metatarsal stump. Intense marrow edema with corresponding mildly decreased T1 marrow signal involving the second toe phalanges. Remaining marrow signal is unremarkable. No fracture or dislocation. Normal alignment. No joint effusion. Small amount of fluid in the second and third intermetatarsal bursae. Ligaments Collateral ligaments are intact. Muscles and Tendons Flexor, peroneal and extensor compartment tendons are intact. Diffuse fatty atrophy of the intrinsic muscles of the forefoot. Soft tissue Diffuse soft tissue swelling of the second toe. Mild dorsal forefoot soft tissue swelling. No fluid collection or hematoma. No soft tissue mass. IMPRESSION: 1. Osteomyelitis of the second toe.  No abscess. 2. Prior first ray amputation with small area of marrow edema at the residual first metatarsal stump, favored reactive in the absence of nearby ulceration. Osteomyelitis is less likely. Electronically Signed   By: Titus Dubin M.D.   On: 01/30/2018 12:13   Dg Foot Complete Left  Result Date: 01/29/2018 CLINICAL DATA:  Cellulitis left foot EXAM: LEFT FOOT - COMPLETE 3+ VIEW COMPARISON:  08/09/2016 FINDINGS: Prior transmetatarsal  amputation of the left 1st toe. No acute bony abnormality. No bony destruction to suggest acute osteomyelitis. Plantar calcaneal spur. Soft tissues are intact. IMPRESSION: Prior 1st toe transmetatarsal amputation. No acute bony abnormality. Electronically Signed   By: Rolm Baptise M.D.   On: 01/29/2018 20:55   Scheduled Meds: . aspirin  325 mg Oral QPM  . chlorhexidine  60 mL Topical Once  . cholecalciferol  5,000 Units Oral Daily  . diphenhydrAMINE  25 mg Oral QHS  . donepezil  5 mg Oral BID  . metoprolol succinate  100 mg Oral Daily  . povidone-iodine  2 application Topical Once  . sertraline  100 mg Oral Daily   Continuous Infusions: . sodium chloride Stopped (01/30/18 1545)  . ceFEPime (MAXIPIME) IV 2 g (01/31/18 0250)  . vancomycin Stopped (01/30/18 1513)    LOS: 2 days   Kerney Elbe,  DO [facility] PAGER is on AMION  If 7PM-7AM, please contact night-coverage [email or web address] Password [email or web address] 01/31/2018, 11:14 AM

## 2018-01-31 NOTE — Anesthesia Preprocedure Evaluation (Signed)
Anesthesia Evaluation  Patient identified by MRN, date of birth, ID band Patient awake    Reviewed: Allergy & Precautions, NPO status , Patient's Chart, lab work & pertinent test results  Airway Mallampati: II  TM Distance: >3 FB Neck ROM: Full    Dental no notable dental hx.    Pulmonary neg pulmonary ROS, former smoker,    Pulmonary exam normal breath sounds clear to auscultation       Cardiovascular hypertension, Pt. on medications negative cardio ROS Normal cardiovascular exam Rhythm:Regular Rate:Normal     Neuro/Psych Dementia negative neurological ROS  negative psych ROS   GI/Hepatic negative GI ROS, Neg liver ROS,   Endo/Other  negative endocrine ROS  Renal/GU Renal InsufficiencyRenal disease  negative genitourinary   Musculoskeletal negative musculoskeletal ROS (+)   Abdominal   Peds negative pediatric ROS (+)  Hematology negative hematology ROS (+)   Anesthesia Other Findings   Reproductive/Obstetrics negative OB ROS                             Anesthesia Physical Anesthesia Plan  ASA: III  Anesthesia Plan: MAC and Regional   Post-op Pain Management:    Induction: Intravenous  PONV Risk Score and Plan: 1 and Ondansetron  Airway Management Planned: Simple Face Mask  Additional Equipment:   Intra-op Plan:   Post-operative Plan:   Informed Consent: I have reviewed the patients History and Physical, chart, labs and discussed the procedure including the risks, benefits and alternatives for the proposed anesthesia with the patient or authorized representative who has indicated his/her understanding and acceptance.   Dental advisory given  Plan Discussed with: CRNA  Anesthesia Plan Comments:         Anesthesia Quick Evaluation

## 2018-01-31 NOTE — Plan of Care (Signed)
83 yo male s/p left foot second toe amputation.continue antibiotics for 72 hours per Ortho.

## 2018-01-31 NOTE — Anesthesia Procedure Notes (Signed)
Anesthesia Regional Block: Popliteal block   Pre-Anesthetic Checklist: ,, timeout performed, Correct Patient, Correct Site, Correct Laterality, Correct Procedure, Correct Position, site marked, Risks and benefits discussed,  Surgical consent,  Pre-op evaluation,  At surgeon's request and post-op pain management  Laterality: Left  Prep: chloraprep       Needles:  Injection technique: Single-shot  Needle Type: Stimiplex     Needle Length: 9cm  Needle Gauge: 21     Additional Needles:   Procedures:,,,, ultrasound used (permanent image in chart),,,,  Narrative:  Start time: 01/31/2018 12:18 PM End time: 01/31/2018 12:23 PM Injection made incrementally with aspirations every 5 mL.  Performed by: Personally  Anesthesiologist: Lynda Rainwater, MD

## 2018-01-31 NOTE — Progress Notes (Signed)
Called report to Short Stay.  

## 2018-01-31 NOTE — Transfer of Care (Signed)
Immediate Anesthesia Transfer of Care Note  Patient: Joel Ibarra  Procedure(s) Performed: LEFT FOOT SECOND TOE AMPUTATION (Left Toe)  Patient Location: PACU  Anesthesia Type:MAC  Level of Consciousness: awake, alert  and oriented  Airway & Oxygen Therapy: Patient Spontanous Breathing  Post-op Assessment: Report given to RN, Post -op Vital signs reviewed and stable and Patient moving all extremities  Post vital signs: Reviewed and stable  Last Vitals:  Vitals Value Taken Time  BP 119/75 01/31/2018  2:06 PM  Temp    Pulse 55 01/31/2018  2:07 PM  Resp 17 01/31/2018  2:07 PM  SpO2 99 % 01/31/2018  2:07 PM  Vitals shown include unvalidated device data.  Last Pain:  Vitals:   01/31/18 1406  TempSrc:   PainSc: (P) 0-No pain         Complications: No apparent anesthesia complications

## 2018-01-31 NOTE — Op Note (Signed)
01/31/2018  1:35 PM  PATIENT:  Joel Ibarra    PRE-OPERATIVE DIAGNOSIS:  cellulitis of left lower extremity, with osteomyelitis and ulceration second toe  POST-OPERATIVE DIAGNOSIS:  Same  PROCEDURE:  LEFT FOOT SECOND RAY AMPUTATION  SURGEON:  Newt Minion, MD  PHYSICIAN ASSISTANT:None ANESTHESIA:   General  PREOPERATIVE INDICATIONS:  Joel Ibarra is a  83 y.o. male with a diagnosis of cellulitis of left lower extrimity who failed conservative measures and elected for surgical management.    The risks benefits and alternatives were discussed with the patient preoperatively including but not limited to the risks of infection, bleeding, nerve injury, cardiopulmonary complications, the need for revision surgery, among others, and the patient was willing to proceed.  OPERATIVE IMPLANTS: None  @ENCIMAGES @  OPERATIVE FINDINGS: Patient had ischemic wound edges at the MTP joint which necessitated a ray amputation.  No deep abscess.  OPERATIVE PROCEDURE: Patient was brought the operating room and underwent a popliteal block.  After adequate levels anesthesia were obtained patient's left lower extremity was prepped using DuraPrep draped into a sterile field a timeout was called.  A racquet incision was made around the second toe at the MTP joint.  The toe was amputated through the MTP joint.  The wound was irrigated with normal saline.  There was no petechial bleeding and the skin edges were ischemic.  The surgical incision was carried back approximately 5 mm further to obtain healthy bleeding granulation tissue.  This necessitated a ray amputation through the shaft of the second metatarsal.  Electrocautery was used for hemostasis there was good petechial bleeding at this time now along the wound edges.  The local tissue was rearranged for wound closure with 2-0 nylon.  A sterile dressing was applied.  Patient was taken to the PACU in stable condition.  There is no signs of any deep  abscess.   DISCHARGE PLANNING:  Antibiotic duration: Continue IV antibiotics for 72 hours postoperatively.  Weightbearing: Nonweightbearing on the left  Pain medication: Opioid pathway ordered  Dressing care/ Wound VAC: Leave dressing clean dry and intact  Ambulatory devices: Walker.  Discharge to: Patient may require discharge to rehab depending on his ambulatory function.  Follow-up: In the office 1 week post operative.

## 2018-02-01 ENCOUNTER — Encounter (HOSPITAL_COMMUNITY): Payer: Self-pay | Admitting: Orthopedic Surgery

## 2018-02-01 LAB — MAGNESIUM: Magnesium: 2.1 mg/dL (ref 1.7–2.4)

## 2018-02-01 LAB — COMPREHENSIVE METABOLIC PANEL
ALT: 15 U/L (ref 0–44)
AST: 20 U/L (ref 15–41)
Albumin: 3 g/dL — ABNORMAL LOW (ref 3.5–5.0)
Alkaline Phosphatase: 61 U/L (ref 38–126)
Anion gap: 11 (ref 5–15)
BILIRUBIN TOTAL: 0.7 mg/dL (ref 0.3–1.2)
BUN: 16 mg/dL (ref 8–23)
CO2: 24 mmol/L (ref 22–32)
Calcium: 8.8 mg/dL — ABNORMAL LOW (ref 8.9–10.3)
Chloride: 104 mmol/L (ref 98–111)
Creatinine, Ser: 1.1 mg/dL (ref 0.61–1.24)
GFR calc non Af Amer: >60 mL/min (ref >60)
Glucose, Bld: 100 mg/dL — ABNORMAL HIGH (ref 70–99)
Potassium: 4 mmol/L (ref 3.5–5.1)
Sodium: 139 mmol/L (ref 135–145)
Total Protein: 6.6 g/dL (ref 6.5–8.1)

## 2018-02-01 LAB — CBC WITH DIFFERENTIAL/PLATELET
Abs Immature Granulocytes: 0.03 10*3/uL (ref 0.00–0.07)
Basophils Absolute: 0 10*3/uL (ref 0.0–0.1)
Basophils Relative: 0 %
Eosinophils Absolute: 0.3 10*3/uL (ref 0.0–0.5)
Eosinophils Relative: 5 %
HCT: 41.2 % (ref 39.0–52.0)
Hemoglobin: 13.3 g/dL (ref 13.0–17.0)
Immature Granulocytes: 1 %
LYMPHS ABS: 0.8 10*3/uL (ref 0.7–4.0)
Lymphocytes Relative: 16 %
MCH: 30.4 pg (ref 26.0–34.0)
MCHC: 32.3 g/dL (ref 30.0–36.0)
MCV: 94.1 fL (ref 80.0–100.0)
Monocytes Absolute: 0.8 10*3/uL (ref 0.1–1.0)
Monocytes Relative: 17 %
Neutro Abs: 2.8 10*3/uL (ref 1.7–7.7)
Neutrophils Relative %: 61 %
Platelets: 159 10*3/uL (ref 150–400)
RBC: 4.38 MIL/uL (ref 4.22–5.81)
RDW: 12.1 % (ref 11.5–15.5)
WBC: 4.7 10*3/uL (ref 4.0–10.5)
nRBC: 0 % (ref 0.0–0.2)

## 2018-02-01 LAB — PHOSPHORUS: PHOSPHORUS: 4.2 mg/dL (ref 2.5–4.6)

## 2018-02-01 NOTE — Progress Notes (Deleted)
Spoke with Dr. Sharol Given to make aware of low BPS. He stated to make sure she drinks lots of fluids, hold BP meds, which were not given this morning, and to get pt up and moving with therapy. Will continue to monitor pt.

## 2018-02-01 NOTE — Evaluation (Signed)
Physical Therapy Evaluation Patient Details Name: Joel Ibarra MRN: [id number] DOB: February 10, 1935 Today's Date: 02/01/2018   History of Present Illness  Patient is an 83 y/o male presenting to the ED on 01/29/18 with primary complaints of L foot erythema. Noted Osteomyelitis cellulitis left foot second toe. S/p second ray amputation on L foot on 01/31/18. PMH significant for HTN, dementia, cancer.     Clinical Impression  Joel Ibarra is a pleasant 83 y/o male admitted with the above listed diagnosis. Patient reports he is a resident of [facility] where he was ambulatory with RW vs rollator? prior to admission. Patient today requiring min/mod A for transfers and mobility with RW with consistent verbal cueing for safety, sequencing, and WB status. Will recommend post-acute rehab at discharge to further progress safe functional mobility. PT to follow acutely.     Follow Up Recommendations Other (comment)(post-acute rehab)    Equipment Recommendations  None recommended by PT    Recommendations for Other Services       Precautions / Restrictions Precautions Precautions: Fall Restrictions Weight Bearing Restrictions: Yes LLE Weight Bearing: Non weight bearing      Mobility  Bed Mobility Overal bed mobility: Needs Assistance Bed Mobility: Supine to Sit     Supine to sit: Min guard     General bed mobility comments: increased time and effort to come EOB; cueing  Transfers Overall transfer level: Needs assistance Equipment used: Rolling walker (2 wheeled) Transfers: Sit to/from [facility] Sit to Stand: Min assist;Mod assist;From elevated surface Stand pivot transfers: Min assist;Mod assist       General transfer comment: Min/Mod A to power up from bed and recliner - cueing for hand placement and sequencing; requires education on NWB status with mobility  Ambulation/Gait Ambulation/Gait assistance: Min assist Gait Distance (Feet): 6 Feet Assistive device:  Rolling walker (2 wheeled) Gait Pattern/deviations: (hop to pattern - consistent verbal cueing to ensure NWB) Gait velocity: decreased   General Gait Details: very unsteady throughout with Min A for balance  Stairs            Wheelchair Mobility    Modified Rankin (Stroke Patients Only)       Balance Overall balance assessment: Needs assistance Sitting-balance support: Bilateral upper extremity supported;Feet supported Sitting balance-Leahy Scale: Fair     Standing balance support: Bilateral upper extremity supported;During functional activity Standing balance-Leahy Scale: Poor Standing balance comment: reliant on external support                             Pertinent Vitals/Pain Pain Assessment: No/denies pain    Home Living Family/patient expects to be discharged to:: Assisted living([facility]) Living Arrangements: Spouse/significant other             Home Equipment: Walker - 4 wheels;Cane - single point      Prior Function Level of Independence: Independent with assistive device(s)         Comments: reports he sponge bathes, meals from [place]        Extremity/Trunk Assessment   Upper Extremity Assessment Upper Extremity Assessment: Defer to OT evaluation    Lower Extremity Assessment Lower Extremity Assessment: Generalized weakness    Cervical / Trunk Assessment Cervical / Trunk Assessment: Normal  Communication   Communication: No difficulties  Cognition Arousal/Alertness: Awake/alert Behavior During Therapy: WFL for tasks assessed/performed Overall Cognitive Status: Impaired/Different from baseline Area of Impairment: Orientation;Following commands;Safety/judgement  Orientation Level: Disoriented to;Place     Following Commands: Follows one step commands consistently;Follows multi-step commands with increased time Safety/Judgement: Decreased awareness of safety;Decreased  awareness of deficits     General Comments: consistent cueing for WB status      General Comments General comments (skin integrity, edema, etc.): pleasantly confused at times    Exercises     Assessment/Plan    PT Assessment Patient needs continued PT services  PT Problem List Decreased strength;Decreased activity tolerance;Decreased balance;Decreased mobility;Decreased knowledge of use of DME;Decreased safety awareness       PT Treatment Interventions DME instruction;Gait training;Functional mobility training;Therapeutic activities;Therapeutic exercise;Balance training;Patient/family education    PT Goals (Current goals can be found in the Care Plan section)  Acute Rehab PT Goals Patient Stated Goal: none stated PT Goal Formulation: With patient Time For Goal Achievement: 02/15/18 Potential to Achieve Goals: Good    Frequency Min 2X/week   Barriers to discharge        Co-evaluation               AM-PAC PT "6 Clicks" Mobility  Outcome Measure Help needed turning from your back to your side while in a flat bed without using bedrails?: A Little Help needed moving from lying on your back to sitting on the side of a flat bed without using bedrails?: A Little Help needed moving to and from a bed to a chair (including a wheelchair)?: A Lot Help needed standing up from a chair using your arms (e.g., wheelchair or bedside chair)?: A Lot Help needed to walk in hospital room?: A Lot Help needed climbing 3-5 steps with a railing? : Total 6 Click Score: 13    End of Session Equipment Utilized During Treatment: Gait belt(post-op shoe) Activity Tolerance: Patient tolerated treatment well Patient left: in chair;with call bell/phone within reach;with chair alarm set Nurse Communication: Mobility status PT Visit Diagnosis: Unsteadiness on feet (R26.81);Other abnormalities of gait and mobility (R26.89)    Time: 7412-8786 PT Time Calculation (min) (ACUTE ONLY): 27  min   Charges:   PT Evaluation $PT Eval Moderate Complexity: 1 Mod PT Treatments $Therapeutic Activity: 8-22 mins        Joel Ibarra, PT, DPT Supplemental Physical Therapist 02/01/18 11:33 AM Pager: [phone] Office: [phone]

## 2018-02-01 NOTE — Progress Notes (Signed)
Patient ID: Joel Ibarra, male   DOB: [birth year], 83 y.o.   MRN: [id number] Postoperative day 1 status post second ray amputation left foot.  Patient had no petechial bleeding at the great toe amputation site which required a more proximal amputation.  The wound edges did have good bleeding after the ray amputation.  Patient did have cellulitis in the midfoot but there is no definite abscess.  Patient most likely will benefit from possibly 72 hours of continued IV antibiotics.  Discussed the importance of nonweightbearing on the left foot.

## 2018-02-01 NOTE — Care Management Important Message (Signed)
Important Message  Patient Details  Name: Joel Ibarra MRN: [id number] Date of Birth: [birth year]   Medicare Important Message Given:  Yes    Bryant Saye 02/01/2018, 3:09 PM

## 2018-02-01 NOTE — Progress Notes (Signed)
Orthopedic Tech Progress Note Patient Details:  Joel Ibarra [birth year] [id number]  Ortho Devices Type of Ortho Device: Postop shoe/boot Ortho Device/Splint Interventions: Adjustment, Application   Post Interventions Patient Tolerated: Well Instructions Provided: Adjustment of device, Care of device   [name] 02/01/2018, 6:04 AM

## 2018-02-01 NOTE — Plan of Care (Signed)
  Problem: Clinical Measurements: Goal: Ability to maintain clinical measurements within normal limits will improve Outcome: Progressing Goal: Will remain free from infection Outcome: Progressing Goal: Diagnostic test results will improve Outcome: Progressing Goal: Respiratory complications will improve Outcome: Progressing   Problem: Activity: Goal: Risk for activity intolerance will decrease Outcome: Progressing   Problem: Nutrition: Goal: Adequate nutrition will be maintained Outcome: Progressing   Problem: Coping: Goal: Level of anxiety will decrease Outcome: Progressing

## 2018-02-01 NOTE — Progress Notes (Addendum)
PROGRESS NOTE    Joel Ibarra  AST:[id number] DOB: 01/27/1935 DOA: 01/29/2018 PCP: System, Pcp Not In  Brief Narrative:83 y.o. male with history of hypertension dementia was brought to the ER after patient had progressively worsening erythema and swelling of the left foot second toe which is moving progressively proximally and now is involving up to the midcalf.  Denies any trauma or fall.  As per patient's son-in-law who is providing the history when patient visited last month or no weight with PCP patient's leg was okay.  Subsequent days which patient started having the swelling in the left foot second toe which has progressed to the foot and mid calf now.  Patient's son-in-law also states that patient's left foot second toe was probed previously by a physician and the infection got worse.  ED Course: In the ER x-rays do not show any bony involvement.  On exam patient has erythema of the left foot up to the midcalf.  Left foot second toe looks erythematous with possible abscess.  Assessment & Plan:   Principal Problem:   Cellulitis of left foot Active Problems:   Tachycardia   Essential hypertension   Dementia (HCC)   Cellulitis   Osteomyelitis of second toe of left foot (HCC)   Abscess, toe, left   Hyperglycemia   Acute kidney injury superimposed on chronic kidney disease (HCC)   Thrombocytopenia ([place])   1 left second toe osteomyelitis with left midfoot cellulitis status post amputation.  Patient nonweightbearing on vancomycin and cefepime.  Will consult social worker for SNF.  Patient states that [place] burn and he will like to go to the rehab at [facility].  White count normal patient is afebrile awake and alert.  Patient needs to stay in the hospital for IV antibiotics for at least minimum 72 hours per Ortho.  Patient can decompensate with sepsis and further amputation without IV antibiotics.  2 hypertension continue metoprolol stable  3 history of dementia on Aricept  continue.  4 acute on chronic CKD stage III stable better.  5 mild thrombocytopeniaRE solved      Estimated body mass index is 23.01 kg/m as calculated from the following:   Height as of this encounter: 5\' 11"  (1.803 m).   Weight as of this encounter: 74.8 kg.  DVT prophylaxis: Lovenox Code Status: DO NOT RESUSCITATE Family Communication: None Disposition Plan: Patient still on IV antibiotics will need placement for rehab at [facility].  DC once Ortho cleared   Consultants: Ortho   Procedures: Status post amputation of the left foot second toe Antimicrobials: Cefepime and vancomycin  Subjective: Sitting up in chair in no acute distress denies chest pain shortness of breath fever chills or cough complains of left foot pain  Objective: Vitals:   01/31/18 2023 02/01/18 0423 02/01/18 0753 02/01/18 1330  BP: 117/62 118/69 105/65 120/71  Pulse: 65 62 66 65  Resp: 14 16 16    Temp: 98 F (36.7 C) (!) 97.5 F (36.4 C) (!) 97.4 F (36.3 C) 98.2 F (36.8 C)  TempSrc: Oral Oral Oral Oral  SpO2: 96% 91% 95% 96%  Weight:      Height:        Intake/Output Summary (Last 24 hours) at 02/01/2018 1458 Last data filed at 02/01/2018 1200 Gross per 24 hour  Intake 240 ml  Output -  Net 240 ml   Filed Weights   01/29/18 1751 01/29/18 1752  Weight: 72.6 kg 74.8 kg    Examination:  General exam: Appears calm  and comfortable  Respiratory system: Clear to auscultation. Respiratory effort normal. Cardiovascular system: S1 & S2 heard, RRR. No JVD, murmurs, rubs, gallops or clicks. No pedal edema. Gastrointestinal system: Abdomen is nondistended, soft and nontender. No organomegaly or masses felt. Normal bowel sounds heard. Central nervous system: Alert and oriented. No focal neurological deficits. Extremities left foot dressings on Skin: No rashes, lesions or ulcers Psychiatry: Judgement and insight appear normal. Mood & affect appropriate.     Data Reviewed: I have  personally reviewed following labs and imaging studies  CBC: Recent Labs  Lab 01/29/18 2003 01/30/18 0511 01/31/18 0513 02/01/18 0151  WBC 8.9 7.1 6.7 4.7  NEUTROABS 6.3  --   --  2.8  HGB 14.0 13.1 14.0 13.3  HCT 44.1 41.0 43.2 41.2  MCV 97.4 98.8 98.2 94.1  PLT 164 145* 145* 062   Basic Metabolic Panel: Recent Labs  Lab 01/29/18 2003 01/30/18 0511 01/31/18 0513 02/01/18 0151  NA 140 139 138 139  K 4.3 4.1 4.7 4.0  CL 103 106 105 104  CO2 25 23 24 24   GLUCOSE 99 109* 113* 100*  BUN 23 21 22 16   CREATININE 1.32* 1.24 1.24 1.10  CALCIUM 9.0 8.7* 8.9 8.8*  MG  --   --   --  2.1  PHOS  --   --   --  4.2   GFR: Estimated Creatinine Clearance: 55.7 mL/min (by C-G formula based on SCr of 1.1 mg/dL). Liver Function Tests: Recent Labs  Lab 02/01/18 0151  AST 20  ALT 15  ALKPHOS 61  BILITOT 0.7  PROT 6.6  ALBUMIN 3.0*   No results for input(s): LIPASE, AMYLASE in the last 168 hours. No results for input(s): AMMONIA in the last 168 hours. Coagulation Profile: No results for input(s): INR, PROTIME in the last 168 hours. Cardiac Enzymes: No results for input(s): CKTOTAL, CKMB, CKMBINDEX, TROPONINI in the last 168 hours. BNP (last 3 results) No results for input(s): PROBNP in the last 8760 hours. HbA1C: No results for input(s): HGBA1C in the last 72 hours. CBG: No results for input(s): GLUCAP in the last 168 hours. Lipid Profile: No results for input(s): CHOL, HDL, LDLCALC, TRIG, CHOLHDL, LDLDIRECT in the last 72 hours. Thyroid Function Tests: No results for input(s): TSH, T4TOTAL, FREET4, T3FREE, THYROIDAB in the last 72 hours. Anemia Panel: No results for input(s): VITAMINB12, FOLATE, FERRITIN, TIBC, IRON, RETICCTPCT in the last 72 hours. Sepsis Labs: No results for input(s): PROCALCITON, LATICACIDVEN in the last 168 hours.  Recent Results (from the past 240 hour(s))  Aerobic Culture (superficial specimen)     Status: None (Preliminary result)   Collection  Time: 01/30/18  2:58 AM  Result Value Ref Range Status   Specimen Description   Final    ABSCESS Performed at [facility] [address], [place], [place] [address]    Special Requests   Final    NONE Performed at [facility], [place] [address] [place], [place] [address]    Gram Stain   Final    RARE WBC PRESENT, PREDOMINANTLY MONONUCLEAR NO ORGANISMS SEEN Performed at [facility], [place] [address] [place], [place] [address]    Culture RARE STAPHYLOCOCCUS AUREUS  Final   Report Status PENDING  Incomplete         Radiology Studies: No results found.      Scheduled Meds: . aspirin  325 mg Oral QPM  . cholecalciferol  5,000 Units Oral Daily  .  diphenhydrAMINE  25 mg Oral QHS  . docusate sodium  100 mg Oral BID  . donepezil  5 mg Oral BID  . metoprolol succinate  100 mg Oral Daily  . sertraline  100 mg Oral Daily   Continuous Infusions: . sodium chloride Stopped (01/30/18 1545)  . sodium chloride 10 mL/hr at 02/01/18 0326  . ceFEPime (MAXIPIME) IV 2 g (02/01/18 0328)  . lactated ringers 50 mL/hr at 01/31/18 1125  . methocarbamol (ROBAXIN) IV    . vancomycin 1,000 mg (02/01/18 1008)     LOS: 3 days     Georgette Shell, MD [facility]  If 7PM-7AM, please contact night-coverage [email or web address] Password [email or web address] 02/01/2018, 2:58 PM

## 2018-02-02 LAB — AEROBIC CULTURE W GRAM STAIN (SUPERFICIAL SPECIMEN)

## 2018-02-02 MED ORDER — PNEUMOCOCCAL VAC POLYVALENT 25 MCG/0.5ML IJ INJ
0.5000 mL | INJECTION | INTRAMUSCULAR | Status: AC
  Administered 2018-02-03: 0.5 mL via INTRAMUSCULAR
  Filled 2018-02-02: qty 0.5

## 2018-02-02 NOTE — Clinical Social Work Note (Signed)
Clinical Social Work Assessment  Patient Details  Name: Joel Ibarra MRN: [id number] Date of Birth: March 10, 1934  Date of referral:  02/02/18               Reason for consult:  Discharge Planning                Permission sought to share information with:  Case Manager, Facility Sport and exercise psychologist, Family Supports Permission granted to share information::  Yes, Verbal Permission Granted  Name::     Soil scientist::  SNFs  Relationship::  daughter  Contact Information:  [phone]  Housing/Transportation Living arrangements for the past 2 months:  Single Family Home([place]) Source of Information:  Patient Patient Interpreter Needed:  None Criminal Activity/Legal Involvement Pertinent to Current Situation/Hospitalization:  No - Comment as needed Significant Relationships:  Adult Children Lives with:  Facility Resident Do you feel safe going back to the place where you live?  Yes Need for family participation in patient care:  Yes (Comment)  Care giving concerns:  CSW received referral for possible SNF placement at time of discharge. Spoke with patient's [name] regarding possibility of SNF placement . Patient's family  is currently unable to care for him at their home given patient's current needs and fall risk.  Patient's [name] expressed understanding of PT recommendation and are agreeable to SNF placement at time of discharge. CSW to continue to follow and assist with discharge planning needs.     Social Worker assessment / plan:  Spoke with patient's [name]  concerning possibility of rehab at [facility] before returning home.    Employment status:  Retired Forensic scientist:  Medicare PT Recommendations:  [place] / Referral to community resources:  [place]  Patient/Family's Response to care:  Patient's [name] recognizes need for rehab before returning home and are agreeable to a  SNF in [place]. They report preference for returning to [facility] where patient was previously staying prior to hospital admittance . CSW explained insurance authorization process. Patient's family reported that they want patient to get stronger to be able to come back home.   Patient/Family's Understanding of and Emotional Response to Diagnosis, Current Treatment, and Prognosis:  Patient/family is realistic regarding therapy needs and expressed being hopeful for SNF placement. Patient expressed understanding of CSW role and discharge process as well as medical condition. No questions/concerns about plan or treatment.    Emotional Assessment Appearance:  Appears stated age Attitude/Demeanor/Rapport:  Unable to Assess Affect (typically observed):  Unable to Assess Orientation:  Oriented to Self, Oriented to Place Alcohol / Substance use:  Not Applicable Psych involvement (Current and /or in the community):  No (Comment)  Discharge Needs  Concerns to be addressed:  Discharge Planning Concerns Readmission within the last 30 days:  No Current discharge risk:  Dependent with Mobility Barriers to Discharge:  Continued Medical Work up   [employer], LCSW 02/02/2018, 1:50 PM

## 2018-02-02 NOTE — Progress Notes (Signed)
Subjective: 2 Days Post-Op Procedure(s) (LRB): LEFT FOOT SECOND TOE AMPUTATION (Left) Patient reports pain as mild.    Objective: Vital signs in last 24 hours: Temp:  [97.4 F (36.3 C)-98.2 F (36.8 C)] 97.7 F (36.5 C) (12/06 0329) Pulse Rate:  [50-66] 50 (12/06 0329) Resp:  [16] 16 (12/06 0329) BP: (105-128)/(56-71) 117/71 (12/06 0329) SpO2:  [95 %-98 %] 98 % (12/06 0329)  Intake/Output from previous day: 12/05 0701 - 12/06 0700 In: 3163.5 [P.O.:480; I.V.:1460.1; IV Piggyback:1223.3] Out: 650 [Urine:650] Intake/Output this shift: No intake/output data recorded.  Recent Labs    01/31/18 0513 02/01/18 0151  HGB 14.0 13.3   Recent Labs    01/31/18 0513 02/01/18 0151  WBC 6.7 4.7  RBC 4.40 4.38  HCT 43.2 41.2  PLT 145* 159   Recent Labs    01/31/18 0513 02/01/18 0151  NA 138 139  K 4.7 4.0  CL 105 104  CO2 24 24  BUN 22 16  CREATININE 1.24 1.10  GLUCOSE 113* 100*  CALCIUM 8.9 8.8*   No results for input(s): LABPT, INR in the last 72 hours. Patient reports little to no pain over the left foot.  operative dressing in place over the left foot. Residual toes warm, good cap refill. wiggles toes, but little feeling.    Assessment/Plan: 2 Days Post-Op Procedure(s) (LRB): LEFT FOOT SECOND TOE AMPUTATION (Left) Recommend continued IV antibiotics for total of 72 hours, then transition to oral at DC. Strict non weight bearing on left foot and elevation of left foot.  No dressing change planned until seen in the office next week.    Joel Ibarra

## 2018-02-02 NOTE — Plan of Care (Signed)
  Problem: Clinical Measurements: Goal: Ability to maintain clinical measurements within normal limits will improve Outcome: Progressing Goal: Will remain free from infection Outcome: Progressing   Problem: Clinical Measurements: Goal: Will remain free from infection Outcome: Progressing   

## 2018-02-02 NOTE — Progress Notes (Signed)
Pharmacy Antibiotic Note  Joel Ibarra is a 83 y.o. male admitted on 01/29/2018 with cellulitis  S/p amputation  Plan: Vancomycin 1gm iv q24hr Cefepime 2 g q12h Continue current therapy as pt likely to convert to PO abx in next day or 2  Goal AUC = 400 - 500 for all indications, except meningitis (goal AUC > 500 and Cmin 15-20 mcg/mL)   Height: 5\' 11"  (180.3 cm) Weight: 165 lb (74.8 kg) IBW/kg (Calculated) : 75.3  Temp (24hrs), Avg:97.9 F (36.6 C), Min:97.6 F (36.4 C), Max:98.2 F (36.8 C)  Recent Labs  Lab 01/29/18 2003 01/30/18 0511 01/31/18 0513 02/01/18 0151  WBC 8.9 7.1 6.7 4.7  CREATININE 1.32* 1.24 1.24 1.10    Estimated Creatinine Clearance: 55.7 mL/min (by C-G formula based on SCr of 1.1 mg/dL).    Allergies  Allergen Reactions  . Bee Venom Swelling  . Codeine Nausea Only, Nausea And Vomiting, Other (See Comments) and Swelling    Other Reaction: Other reaction Other reaction(s): Swelling Other reaction(s): Swelling Other reaction(s): Swelling   . Daptomycin Other (See Comments)    Pneumonitis  . Clindamycin/Lincomycin Rash  . Lincomycin Rash  . Lactose Other (See Comments) and Diarrhea    Other reaction(s): Cramps (ALLERGY/intolerance) Other reaction(s): Cramps (ALLERGY/intolerance) Other reaction(s): Cramps (ALLERGY/intolerance) Other reaction: Cramps (ALLERGY/intolerance)--Pt avoids ALL lactose containing items including items with lactose baked in. JLS 11/25/16. Other reaction(s): Cramps (ALLERGY/intolerance) Other reaction(s): Cramps (ALLERGY/intolerance) Other reaction(s): Cramps (ALLERGY/intolerance)    Levester Fresh, PharmD, BCPS, BCCCP Clinical Pharmacist [phone]  Please check AMION for all [place] numbers  02/02/2018 10:21 AM

## 2018-02-02 NOTE — Progress Notes (Signed)
PROGRESS NOTE    Joel Ibarra  ZOX:[id number] DOB: 06-23-1934 DOA: 01/29/2018 PCP: System, Pcp Not In   Brief Narrative: 83 y.o.malewithhistory of hypertension dementia was brought to the ER after patient had progressively worsening erythema and swelling of the left foot second toe which is moving progressively proximally and now is involving up to the midcalf. Denies any trauma or fall. As per patient's son-in-law who is providing the history when patient visited last month or no weight with PCP patient's leg was okay. Subsequent days which patient started having the swelling in the left foot second toe which has progressed to the foot and mid calf now. Patient's son-in-law also states that patient's left foot second toe was probed previously by a physician and the infection got worse.  ED Course:In the ER x-rays do not show any bony involvement. On exam patient has erythema of the left foot up to the midcalf. Left foot second toe looks erythematous with possible abscess.   Assessment & Plan:   Principal Problem:   Cellulitis of left foot Active Problems:   Tachycardia   Essential hypertension   Dementia (HCC)   Cellulitis   Osteomyelitis of second toe of left foot (HCC)   Abscess, toe, left   Hyperglycemia   Acute kidney injury superimposed on chronic kidney disease (HCC)   Thrombocytopenia ([place])  1 left second toe osteomyelitis with left midfoot cellulitis status post amputation.  Patient nonweightbearing on vancomycin and cefepime.  Will consult social worker for SNF.  Patient states that [place] burn and he will like to go to the rehab at [facility].  White count normal patient is afebrile awake and alert.  Patient needs to stay in the hospital for IV antibiotics for at least minimum 72 hours per Ortho.  Continue IV antibiotics for another 48 hours. Patient can decompensate with sepsis and further amputation without IV antibiotics.  2 hypertension continue metoprolol  stable  3 history of dementia on Aricept continue.  4 acute on chronic CKD stage III stable better.  5 mild thrombocytopeniaRE solved     Estimated body mass index is 23.01 kg/m as calculated from the following:   Height as of this encounter: 5\' 11"  (1.803 m).   Weight as of this encounter: 74.8 kg.  DVT prophylaxis: Lovenox Code Status: DO NOT RESUSCITATE Family Communication: Will discuss with the daughter [name] Disposition Plan: Continue IV antibiotics for another 48 hours per Ortho  Consultants:  Ortho  Procedures: Left foot second toe amputation Antimicrobials: Vanco and cefepime Subjective: Resting in bed appears little bit more tired than yesterday did not sleep well had a bowel movement yesterday denies nausea vomiting chest pain shortness of breath or cough or diarrhea.   Objective: Vitals:   02/01/18 1618 02/01/18 1928 02/02/18 0329 02/02/18 1025  BP: (!) 128/56 118/67 117/71 122/65  Pulse: (!) 58 (!) 54 (!) 50 (!) 54  Resp: 16 16 16 16   Temp: 98.1 F (36.7 C) 97.6 F (36.4 C) 97.7 F (36.5 C) 97.7 F (36.5 C)  TempSrc: Oral Oral Oral Oral  SpO2: 95% 98% 98% 98%  Weight:      Height:        Intake/Output Summary (Last 24 hours) at 02/02/2018 1205 Last data filed at 02/02/2018 0330 Gross per 24 hour  Intake 2923.46 ml  Output 650 ml  Net 2273.46 ml   Filed Weights   01/29/18 1751 01/29/18 1752  Weight: 72.6 kg 74.8 kg    Examination:  General exam: Appears  calm and comfortable  Respiratory system: Clear to auscultation. Respiratory effort normal. Cardiovascular system: S1 & S2 heard, RRR. No JVD, murmurs, rubs, gallops or clicks. No pedal edema. Gastrointestinal system: Abdomen is nondistended, soft and nontender. No organomegaly or masses felt. Normal bowel sounds heard. Central nervous system: Alert and oriented. No focal neurological deficits. Extremities: Left foot dressings in place Skin: No rashes, lesions or ulcers Psychiatry:  Judgement and insight appear normal. Mood & affect appropriate.     Data Reviewed: I have personally reviewed following labs and imaging studies  CBC: Recent Labs  Lab 01/29/18 2003 01/30/18 0511 01/31/18 0513 02/01/18 0151  WBC 8.9 7.1 6.7 4.7  NEUTROABS 6.3  --   --  2.8  HGB 14.0 13.1 14.0 13.3  HCT 44.1 41.0 43.2 41.2  MCV 97.4 98.8 98.2 94.1  PLT 164 145* 145* 093   Basic Metabolic Panel: Recent Labs  Lab 01/29/18 2003 01/30/18 0511 01/31/18 0513 02/01/18 0151  NA 140 139 138 139  K 4.3 4.1 4.7 4.0  CL 103 106 105 104  CO2 25 23 24 24   GLUCOSE 99 109* 113* 100*  BUN 23 21 22 16   CREATININE 1.32* 1.24 1.24 1.10  CALCIUM 9.0 8.7* 8.9 8.8*  MG  --   --   --  2.1  PHOS  --   --   --  4.2   GFR: Estimated Creatinine Clearance: 55.7 mL/min (by C-G formula based on SCr of 1.1 mg/dL). Liver Function Tests: Recent Labs  Lab 02/01/18 0151  AST 20  ALT 15  ALKPHOS 61  BILITOT 0.7  PROT 6.6  ALBUMIN 3.0*   No results for input(s): LIPASE, AMYLASE in the last 168 hours. No results for input(s): AMMONIA in the last 168 hours. Coagulation Profile: No results for input(s): INR, PROTIME in the last 168 hours. Cardiac Enzymes: No results for input(s): CKTOTAL, CKMB, CKMBINDEX, TROPONINI in the last 168 hours. BNP (last 3 results) No results for input(s): PROBNP in the last 8760 hours. HbA1C: No results for input(s): HGBA1C in the last 72 hours. CBG: No results for input(s): GLUCAP in the last 168 hours. Lipid Profile: No results for input(s): CHOL, HDL, LDLCALC, TRIG, CHOLHDL, LDLDIRECT in the last 72 hours. Thyroid Function Tests: No results for input(s): TSH, T4TOTAL, FREET4, T3FREE, THYROIDAB in the last 72 hours. Anemia Panel: No results for input(s): VITAMINB12, FOLATE, FERRITIN, TIBC, IRON, RETICCTPCT in the last 72 hours. Sepsis Labs: No results for input(s): PROCALCITON, LATICACIDVEN in the last 168 hours.  Recent Results (from the past 240 hour(s))    Aerobic Culture (superficial specimen)     Status: None (Preliminary result)   Collection Time: 01/30/18  2:58 AM  Result Value Ref Range Status   Specimen Description   Final    ABSCESS Performed at [facility] [address] [place], [place] [address]    Special Requests   Final    NONE Performed at [facility], [place] [address], [place], [place] [address]    Gram Stain   Final    RARE WBC PRESENT, PREDOMINANTLY MONONUCLEAR NO ORGANISMS SEEN Performed at [facility], [place] [address] [place], [place] [address]    Culture RARE STAPHYLOCOCCUS AUREUS  Final   Report Status PENDING  Incomplete         Radiology Studies: No results found.      Scheduled Meds: . aspirin  325 mg Oral QPM  . cholecalciferol  5,000 Units Oral  Daily  . diphenhydrAMINE  25 mg Oral QHS  . docusate sodium  100 mg Oral BID  . donepezil  5 mg Oral BID  . metoprolol succinate  100 mg Oral Daily  . sertraline  100 mg Oral Daily   Continuous Infusions: . sodium chloride Stopped (01/30/18 1545)  . sodium chloride 10 mL/hr at 02/01/18 1445  . ceFEPime (MAXIPIME) IV 2 g (02/02/18 0330)  . lactated ringers 50 mL/hr at 01/31/18 1125  . methocarbamol (ROBAXIN) IV    . vancomycin 1,000 mg (02/02/18 1027)     LOS: 4 days     Georgette Shell, MD [facility]  If 7PM-7AM, please contact night-coverage [email or web address] Password [email or web address] 02/02/2018, 12:05 PM

## 2018-02-02 NOTE — NC FL2 (Signed)
[place] LEVEL OF CARE SCREENING TOOL     IDENTIFICATION  Patient Name: Joel Ibarra Birthdate: 08/02/1934 Sex: male Admission Date (Current Location): 01/29/2018  [facility] and [place] Number:  Herbalist and Address:  [facility]. [facility], [place] [address], [place], [place] [address]      Provider Number: [id number]  Attending Physician Name and Address:  Georgette Shell, MD  Relative Name and Phone Number:  [name] (daughter) [phone]    Current Level of Care: Hospital Recommended Level of Care: [place] Prior Approval Number:    Date Approved/Denied:   PASRR Number: [id number]  Discharge Plan: SNF    Current Diagnoses: Patient Active Problem List   Diagnosis Date Noted  . Hyperglycemia 01/31/2018  . Acute kidney injury superimposed on chronic kidney disease ([place]) 01/31/2018  . Thrombocytopenia ([place]) 01/31/2018  . Osteomyelitis of second toe of left foot ([place])   . Abscess, toe, left   . Cellulitis of left foot 01/29/2018  . Essential hypertension 01/29/2018  . Dementia ([place]) 01/29/2018  . Cellulitis 01/29/2018  . Diarrhea 06/15/2017  . Nausea & vomiting 06/15/2017  . Tachycardia 06/15/2017    Orientation RESPIRATION BLADDER Height & Weight     Self, Place  Normal Continent Weight: 74.8 kg Height:  5\' 11"  (180.3 cm)  BEHAVIORAL SYMPTOMS/MOOD NEUROLOGICAL BOWEL NUTRITION STATUS      Continent Diet(see discharge summary)  AMBULATORY STATUS COMMUNICATION OF NEEDS Skin   Limited Assist Verbally Surgical wounds, Skin abrasions(abrasion right foot, amputation left toe, closed surgical incision left toe)                       Personal Care Assistance Level of Assistance  Bathing, Feeding, Dressing, Total care Bathing Assistance: Limited assistance Feeding assistance: Independent Dressing Assistance: Limited assistance Total Care Assistance: Limited assistance   Functional Limitations  Info  Hearing, Sight, Speech Sight Info: Adequate Hearing Info: Adequate Speech Info: Adequate    SPECIAL CARE FACTORS FREQUENCY  PT (By licensed PT), OT (By licensed OT)     PT Frequency: min 5x weekly OT Frequency: min 3x weekly            Contractures Contractures Info: Not present    Additional Factors Info  Code Status, Allergies Code Status Info: DNR Allergies Info: Allergies:  Bee Venom, Codeine, Daptomycin, Clindamycin/lincomycin, Lincomycin, Lactose           Current Medications (02/02/2018):  This is the current hospital active medication list Current Facility-Administered Medications  Medication Dose Route Frequency Provider Last Rate Last Dose  . 0.9 %  sodium chloride infusion   Intravenous PRN Newt Minion, MD   Stopped at 01/30/18 1545  . 0.9 %  sodium chloride infusion   Intravenous Continuous Newt Minion, MD 10 mL/hr at 02/01/18 1445    . acetaminophen (TYLENOL) tablet 650 mg  650 mg Oral Q6H PRN Newt Minion, MD       Or  . acetaminophen (TYLENOL) suppository 650 mg  650 mg Rectal Q6H PRN Newt Minion, MD      . aspirin EC tablet 325 mg  325 mg Oral QPM Newt Minion, MD   325 mg at 02/01/18 1730  . bisacodyl (DULCOLAX) suppository 10 mg  10 mg Rectal Daily PRN Newt Minion, MD      . ceFEPIme (MAXIPIME) 2 g in sodium chloride 0.9 % 100 mL IVPB  2 g Intravenous Q12H Sharol Given,  Illene Regulus, MD 200 mL/hr at 02/02/18 0330 2 g at 02/02/18 0330  . cholecalciferol (VITAMIN D3) tablet 5,000 Units  5,000 Units Oral Daily Newt Minion, MD   5,000 Units at 02/02/18 1028  . diphenhydrAMINE (BENADRYL) capsule 25 mg  25 mg Oral QHS Newt Minion, MD   25 mg at 02/01/18 2110  . docusate sodium (COLACE) capsule 100 mg  100 mg Oral BID Newt Minion, MD   100 mg at 02/02/18 1027  . donepezil (ARICEPT) tablet 5 mg  5 mg Oral BID Newt Minion, MD   5 mg at 02/02/18 1028  . HYDROcodone-acetaminophen (NORCO) 7.5-325 MG per tablet 1-2 tablet  1-2 tablet Oral Q4H PRN  Newt Minion, MD      . HYDROcodone-acetaminophen (NORCO/VICODIN) 5-325 MG per tablet 1-2 tablet  1-2 tablet Oral Q4H PRN Newt Minion, MD      . lactated ringers infusion   Intravenous Continuous Newt Minion, MD 50 mL/hr at 01/31/18 1125    . magnesium citrate solution 1 Bottle  1 Bottle Oral Once PRN Newt Minion, MD      . methocarbamol (ROBAXIN) tablet 500 mg  500 mg Oral Q6H PRN Newt Minion, MD       Or  . methocarbamol (ROBAXIN) 500 mg in dextrose 5 % 50 mL IVPB  500 mg Intravenous Q6H PRN Newt Minion, MD      . metoCLOPramide (REGLAN) tablet 5-10 mg  5-10 mg Oral Q8H PRN Newt Minion, MD       Or  . metoCLOPramide (REGLAN) injection 5-10 mg  5-10 mg Intravenous Q8H PRN Newt Minion, MD      . metoprolol succinate (TOPROL-XL) 24 hr tablet 100 mg  100 mg Oral Daily Newt Minion, MD   100 mg at 02/02/18 1028  . morphine 2 MG/ML injection 0.5-1 mg  0.5-1 mg Intravenous Q2H PRN Newt Minion, MD      . ondansetron [facility]) tablet 4 mg  4 mg Oral Q6H PRN Newt Minion, MD       Or  . ondansetron [facility]) injection 4 mg  4 mg Intravenous Q6H PRN Newt Minion, MD      . Derrill Memo ON 02/03/2018] pneumococcal 23 valent vaccine (PNU-IMMUNE) injection 0.5 mL  0.5 mL Intramuscular Tomorrow-1000 Georgette Shell, MD      . polyethylene glycol (MIRALAX / GLYCOLAX) packet 17 g  17 g Oral Daily PRN Newt Minion, MD      . sertraline (ZOLOFT) tablet 100 mg  100 mg Oral Daily Newt Minion, MD   100 mg at 02/02/18 1027  . vancomycin (VANCOCIN) IVPB 1000 mg/200 mL premix  1,000 mg Intravenous Q24H Newt Minion, MD 200 mL/hr at 02/02/18 1027 1,000 mg at 02/02/18 1027     Discharge Medications: Please see discharge summary for a list of discharge medications.  Relevant Imaging Results:  Relevant Lab Results:   Additional Information SSN: [id number]  Alberteen Sam, LCSW

## 2018-02-03 LAB — CBC WITH DIFFERENTIAL/PLATELET
Abs Immature Granulocytes: 0.04 10*3/uL (ref 0.00–0.07)
Basophils Absolute: 0 10*3/uL (ref 0.0–0.1)
Basophils Relative: 1 %
EOS PCT: 6 %
Eosinophils Absolute: 0.3 10*3/uL (ref 0.0–0.5)
HCT: 43.5 % (ref 39.0–52.0)
Hemoglobin: 13.6 g/dL (ref 13.0–17.0)
Immature Granulocytes: 1 %
Lymphocytes Relative: 23 %
Lymphs Abs: 1.1 10*3/uL (ref 0.7–4.0)
MCH: 29.6 pg (ref 26.0–34.0)
MCHC: 31.3 g/dL (ref 30.0–36.0)
MCV: 94.8 fL (ref 80.0–100.0)
Monocytes Absolute: 0.8 10*3/uL (ref 0.1–1.0)
Monocytes Relative: 17 %
Neutro Abs: 2.4 10*3/uL (ref 1.7–7.7)
Neutrophils Relative %: 52 %
Platelets: 192 10*3/uL (ref 150–400)
RBC: 4.59 MIL/uL (ref 4.22–5.81)
RDW: 11.9 % (ref 11.5–15.5)
WBC: 4.7 10*3/uL (ref 4.0–10.5)
nRBC: 0 % (ref 0.0–0.2)

## 2018-02-03 LAB — BASIC METABOLIC PANEL
Anion gap: 11 (ref 5–15)
BUN: 22 mg/dL (ref 8–23)
CALCIUM: 9 mg/dL (ref 8.9–10.3)
CO2: 25 mmol/L (ref 22–32)
CREATININE: 1.52 mg/dL — AB (ref 0.61–1.24)
Chloride: 104 mmol/L (ref 98–111)
GFR calc Af Amer: 49 mL/min — ABNORMAL LOW (ref >60)
GFR calc non Af Amer: 42 mL/min — ABNORMAL LOW (ref >60)
Glucose, Bld: 99 mg/dL (ref 70–99)
Potassium: 5.1 mmol/L (ref 3.5–5.1)
SODIUM: 140 mmol/L (ref 135–145)

## 2018-02-03 MED ORDER — SODIUM CHLORIDE 0.9 % IV SOLN
100.0000 mg | Freq: Two times a day (BID) | INTRAVENOUS | Status: DC
  Administered 2018-02-03 – 2018-02-05 (×4): 100 mg via INTRAVENOUS
  Filled 2018-02-03 (×5): qty 100

## 2018-02-03 MED ORDER — SODIUM CHLORIDE 0.9 % IV SOLN
INTRAVENOUS | Status: DC
  Administered 2018-02-03: 16:00:00 via INTRAVENOUS

## 2018-02-03 NOTE — Progress Notes (Signed)
Subjective: 3 Days Post-Op Procedure(s) (LRB): LEFT FOOT SECOND TOE AMPUTATION (Left) Patient reports pain as mild.    Objective: Vital signs in last 24 hours: Temp:  [97.5 F (36.4 C)-98.1 F (36.7 C)] 97.5 F (36.4 C) (12/07 0621) Pulse Rate:  [49-58] 49 (12/07 0621) Resp:  [16-17] 16 (12/06 1945) BP: (100-128)/(64-72) 117/69 (12/07 0621) SpO2:  [95 %-98 %] 95 % (12/07 0621)  Intake/Output from previous day: 12/06 0701 - 12/07 0700 In: -  Out: 400 [Urine:400] Intake/Output this shift: No intake/output data recorded.  Recent Labs    02/01/18 0151 02/03/18 0458  HGB 13.3 13.6   Recent Labs    02/01/18 0151 02/03/18 0458  WBC 4.7 4.7  RBC 4.38 4.59  HCT 41.2 43.5  PLT 159 192   Recent Labs    02/01/18 0151 02/03/18 0458  NA 139 140  K 4.0 5.1  CL 104 104  CO2 24 25  BUN 16 22  CREATININE 1.10 1.52*  GLUCOSE 100* 99  CALCIUM 8.8* 9.0   No results for input(s): LABPT, INR in the last 72 hours.  Left foot operative dressings in place. Wiggles residual toes and sensation intact to light touch.     Assessment/Plan: 3 Days Post-Op Procedure(s) (LRB): LEFT FOOT SECOND TOE AMPUTATION (Left) Doing well from ortho standpoint. Strict non weight bearing as much as possible and elevate left foot as much as possible.  Will plan to see in office next week. Family reports planning discharge to SNF.    Joel Ibarra

## 2018-02-03 NOTE — Progress Notes (Signed)
PROGRESS NOTE    Joel Ibarra  HCW:[id number] DOB: Dec 28, 1934 DOA: 01/29/2018 PCP: System, Pcp Not In    Brief Narrative: 83 y.o.malewithhistory of hypertension dementia was brought to the ER after patient had progressively worsening erythema and swelling of the left foot second toe which is moving progressively proximally and now is involving up to the midcalf. Denies any trauma or fall. As per patient's son-in-law who is providing the history when patient visited last month or no weight with PCP patient's leg was okay. Subsequent days which patient started having the swelling in the left foot second toe which has progressed to the foot and mid calf now. Patient's son-in-law also states that patient's left foot second toe was probed previously by a physician and the infection got worse.  ED Course:In the ER x-rays do not show any bony involvement. On exam patient has erythema of the left foot up to the midcalf. Left foot second toe looks erythematous with possible abscess.  Assessment & Plan:   Principal Problem:   Cellulitis of left foot Active Problems:   Tachycardia   Essential hypertension   Dementia (HCC)   Cellulitis   Osteomyelitis of second toe of left foot (HCC)   Abscess, toe, left   Hyperglycemia   Acute kidney injury superimposed on chronic kidney disease (H[facility])   Thrombocytopenia ([place])  1 left second toe osteomyelitis with left midfoot cellulitis status post amputation.-Patient seems to be doing well after surgery.  His pain is being controlled.  He is on vancomycin and cefepime but with the rising creatinine I will DC the V[place] and place him on doxyCYCLINE.  2 hypertension continue metoprolol stable  3 history of dementia on Aricept continue.  4 acute on chronic CKD stage III patient's creatinine has bumped up probably secondary to decreased p.o. intake will hydrate him and DC vancomycin.  5 mild thrombocytopeniaREsolved    Estimated body mass  index is 23.01 kg/m as calculated from the following:   Height as of this encounter: 5\' 11"  (1.803 m).   Weight as of this encounter: 74.8 kg.  DVT prophylaxis Lovenox Code Status: DNR Family Communication: None Disposition Plan: Plan discharge to [facility] tomorrow Consultants:  Ortho Procedures: Status post left second toe amputation Antimicrobials: Doxycycline and cefepime  Subjective: Resting in bed reports decreased p.o. intake asking for water Objective: Vitals:   02/02/18 1259 02/02/18 1945 02/03/18 0621 02/03/18 1031  BP: 100/64 128/72 117/69 117/69  Pulse: (!) 54 (!) 58 (!) 49 (!) 49  Resp: 17 16    Temp: 98.1 F (36.7 C) 98 F (36.7 C) (!) 97.5 F (36.4 C)   TempSrc: Oral Oral Oral   SpO2: 95% 96% 95%   Weight:      Height:        Intake/Output Summary (Last 24 hours) at 02/03/2018 1332 Last data filed at 02/03/2018 0855 Gross per 24 hour  Intake 480 ml  Output -  Net 480 ml   Filed Weights   01/29/18 1751 01/29/18 1752  Weight: 72.6 kg 74.8 kg    Examination: Oral mucosa dry  General exam: Appears calm and comfortable  Respiratory system: Clear to auscultation. Respiratory effort normal. Cardiovascular system: S1 & S2 heard, RRR. No JVD, murmurs, rubs, gallops or clicks. No pedal edema. Gastrointestinal system: Abdomen is nondistended, soft and nontender. No organomegaly or masses felt. Normal bowel sounds heard. Central nervous system: Alert and oriented. No focal neurological deficits. Extremities: Left foot dressings in place Skin: No rashes, lesions  or ulcers Psychiatry: Judgement and insight appear normal. Mood & affect appropriate.     Data Reviewed: I have personally reviewed following labs and imaging studies  CBC: Recent Labs  Lab 01/29/18 2003 01/30/18 0511 01/31/18 0513 02/01/18 0151 02/03/18 0458  WBC 8.9 7.1 6.7 4.7 4.7  NEUTROABS 6.3  --   --  2.8 2.4  HGB 14.0 13.1 14.0 13.3 13.6  HCT 44.1 41.0 43.2 41.2 43.5  MCV 97.4  98.8 98.2 94.1 94.8  PLT 164 145* 145* 159 097   Basic Metabolic Panel: Recent Labs  Lab 01/29/18 2003 01/30/18 0511 01/31/18 0513 02/01/18 0151 02/03/18 0458  NA 140 139 138 139 140  K 4.3 4.1 4.7 4.0 5.1  CL 103 106 105 104 104  CO2 25 23 24 24 25   GLUCOSE 99 109* 113* 100* 99  BUN 23 21 22 16 22   CREATININE 1.32* 1.24 1.24 1.10 1.52*  CALCIUM 9.0 8.7* 8.9 8.8* 9.0  MG  --   --   --  2.1  --   PHOS  --   --   --  4.2  --    GFR: Estimated Creatinine Clearance: 40.3 mL/min (A) (by C-G formula based on SCr of 1.52 mg/dL (H)). Liver Function Tests: Recent Labs  Lab 02/01/18 0151  AST 20  ALT 15  ALKPHOS 61  BILITOT 0.7  PROT 6.6  ALBUMIN 3.0*   No results for input(s): LIPASE, AMYLASE in the last 168 hours. No results for input(s): AMMONIA in the last 168 hours. Coagulation Profile: No results for input(s): INR, PROTIME in the last 168 hours. Cardiac Enzymes: No results for input(s): CKTOTAL, CKMB, CKMBINDEX, TROPONINI in the last 168 hours. BNP (last 3 results) No results for input(s): PROBNP in the last 8760 hours. HbA1C: No results for input(s): HGBA1C in the last 72 hours. CBG: No results for input(s): GLUCAP in the last 168 hours. Lipid Profile: No results for input(s): CHOL, HDL, LDLCALC, TRIG, CHOLHDL, LDLDIRECT in the last 72 hours. Thyroid Function Tests: No results for input(s): TSH, T4TOTAL, FREET4, T3FREE, THYROIDAB in the last 72 hours. Anemia Panel: No results for input(s): VITAMINB12, FOLATE, FERRITIN, TIBC, IRON, RETICCTPCT in the last 72 hours. Sepsis Labs: No results for input(s): PROCALCITON, LATICACIDVEN in the last 168 hours.  Recent Results (from the past 240 hour(s))  Aerobic Culture (superficial specimen)     Status: None   Collection Time: 01/30/18  2:58 AM  Result Value Ref Range Status   Specimen Description   Final    ABSCESS Performed at [facility] [address] [place], [place] [address]    Special  Requests   Final    NONE Performed at [facility], [place] [address], [place], [place] [address]    Gram Stain   Final    RARE WBC PRESENT, PREDOMINANTLY MONONUCLEAR NO ORGANISMS SEEN Performed at [facility], [place] [address] [place],  [address]    Culture RARE METHICILLIN RESISTANT STAPHYLOCOCCUS AUREUS  Final   Report Status 02/02/2018 FINAL  Final   Organism ID, Bacteria METHICILLIN RESISTANT STAPHYLOCOCCUS AUREUS  Final      Susceptibility   Methicillin resistant staphylococcus aureus - MIC*    CIPROFLOXACIN >=8 RESISTANT Resistant     ERYTHROMYCIN >=8 RESISTANT Resistant     GENTAMICIN <=0.5 SENSITIVE Sensitive     OXACILLIN >=4 RESISTANT Resistant     TETRACYCLINE <=1 SENSITIVE Sensitive     VANCOMYCIN 1 SENSITIVE Sensitive  TRIMETH/SULFA >=320 RESISTANT Resistant     CLINDAMYCIN <=0.25 SENSITIVE Sensitive     RIFAMPIN <=0.5 SENSITIVE Sensitive     Inducible Clindamycin NEGATIVE Sensitive     * RARE METHICILLIN RESISTANT STAPHYLOCOCCUS AUREUS         Radiology Studies: No results found.      Scheduled Meds: . aspirin  325 mg Oral QPM  . cholecalciferol  5,000 Units Oral Daily  . diphenhydrAMINE  25 mg Oral QHS  . docusate sodium  100 mg Oral BID  . donepezil  5 mg Oral BID  . metoprolol succinate  100 mg Oral Daily  . sertraline  100 mg Oral Daily   Continuous Infusions: . sodium chloride Stopped (01/30/18 1545)  . sodium chloride 10 mL/hr at 02/01/18 1445  . ceFEPime (MAXIPIME) IV 2 g (02/03/18 0431)  . lactated ringers 50 mL/hr at 01/31/18 1125  . methocarbamol (ROBAXIN) IV    . vancomycin 1,000 mg (02/03/18 1038)     LOS: 5 days       Georgette Shell, MD [facility]  If 7PM-7AM, please contact night-coverage [email or web address] Password [email or web address] 02/03/2018, 1:32 PM

## 2018-02-04 LAB — BASIC METABOLIC PANEL
Anion gap: 11 (ref 5–15)
BUN: 26 mg/dL — ABNORMAL HIGH (ref 8–23)
CO2: 20 mmol/L — ABNORMAL LOW (ref 22–32)
Calcium: 8.7 mg/dL — ABNORMAL LOW (ref 8.9–10.3)
Chloride: 105 mmol/L (ref 98–111)
Creatinine, Ser: 1.19 mg/dL (ref 0.61–1.24)
GFR calc Af Amer: >60 mL/min (ref >60)
GFR, EST NON AFRICAN AMERICAN: 57 mL/min — AB (ref >60)
Glucose, Bld: 81 mg/dL (ref 70–99)
POTASSIUM: 5.8 mmol/L — AB (ref 3.5–5.1)
Sodium: 136 mmol/L (ref 135–145)

## 2018-02-04 MED ORDER — SODIUM POLYSTYRENE SULFONATE 15 GM/60ML PO SUSP
15.0000 g | Freq: Once | ORAL | Status: AC
  Administered 2018-02-04: 15 g via ORAL
  Filled 2018-02-04: qty 60

## 2018-02-04 MED ORDER — DOXYCYCLINE HYCLATE 50 MG PO CAPS: 100.0000 mg | ORAL_CAPSULE | Freq: Two times a day (BID) | ORAL | 1 refills | Status: DC

## 2018-02-04 MED ORDER — SACCHAROMYCES BOULARDII 250 MG PO CAPS: 250.0000 mg | ORAL_CAPSULE | Freq: Two times a day (BID) | ORAL | 0 refills | Status: DC

## 2018-02-04 MED ORDER — ACETAMINOPHEN 325 MG PO TABS: 650.0000 mg | ORAL_TABLET | Freq: Four times a day (QID) | ORAL | Status: DC | PRN

## 2018-02-04 MED ORDER — SACCHAROMYCES BOULARDII 250 MG PO CAPS
250.0000 mg | ORAL_CAPSULE | Freq: Two times a day (BID) | ORAL | Status: DC
  Administered 2018-02-04 – 2018-02-05 (×3): 250 mg via ORAL
  Filled 2018-02-04 (×3): qty 1

## 2018-02-04 MED ORDER — HYDROCODONE-ACETAMINOPHEN 7.5-325 MG PO TABS: 1.0000 | ORAL_TABLET | ORAL | 0 refills | Status: DC | PRN

## 2018-02-04 NOTE — Progress Notes (Signed)
Subjective: 4 Days Post-Op Procedure(s) (LRB): LEFT FOOT SECOND TOE AMPUTATION (Left) Patient reports pain as mild.    Objective: Vital signs in last 24 hours: Temp:  [97.8 F (36.6 C)-98.6 F (37 C)] 97.8 F (36.6 C) (12/08 0429) Pulse Rate:  [48-53] 48 (12/08 0429) Resp:  [17-19] 19 (12/08 0429) BP: (112-137)/(62-69) 112/68 (12/08 0429) SpO2:  [95 %-96 %] 95 % (12/08 0429)  Intake/Output from previous day: 12/07 0701 - 12/08 0700 In: 6202 [P.O.:840; I.V.:4845.3; IV Piggyback:516.7] Out: 800 [Urine:800] Intake/Output this shift: No intake/output data recorded.  Recent Labs    02/03/18 0458  HGB 13.6   Recent Labs    02/03/18 0458  WBC 4.7  RBC 4.59  HCT 43.5  PLT 192   Recent Labs    02/03/18 0458 02/04/18 0209  NA 140 136  K 5.1 5.8*  CL 104 105  CO2 25 20*  BUN 22 26*  CREATININE 1.52* 1.19  GLUCOSE 99 81  CALCIUM 9.0 8.7*   No results for input(s): LABPT, INR in the last 72 hours.  Left foot operative dressing in place and without signs of drainage. Wiggles residual toes and sensation intact over toes.     Assessment/Plan: 4 Days Post-Op Procedure(s) (LRB): LEFT FOOT SECOND TOE AMPUTATION (Left) Plan DC to SNF.  Strict non weight bearing as much as possible and elevate left foot as much as possible.  Will plan to see in office next week for removal of operative dressings.   [place]

## 2018-02-04 NOTE — Discharge Summary (Addendum)
Physician Discharge Summary  Matan Steen PIR:[id number] DOB: 1934/06/20 DOA: 01/29/2018  PCP: System, Pcp Not In  Admit date: 01/29/2018 Discharge date: 02/05/2018 Admitted From: [facility] Disposition: [facility] burn   recommendations for Outpatient Follow-up:  1. Follow up with PCP in 1-2 weeks 2. Please obtain BMP/CBC in one week 3. Follow-up with Dr. Sharol Given next week for removal of stitches   Home Health: None Equipment/Devices none Discharge Condition: Stable CODE STATUS full code Diet recommendation: Cardiac Brief/Interim Summary: 83 y.o.malewithhistory of hypertension dementia was brought to the ER after patient had progressively worsening erythema and swelling of the left foot second toe which is moving progressively proximally and now is involving up to the midcalf. Denies any trauma or fall. As per patient's son-in-law who is providing the history when patient visited last month or no weight with PCP patient's leg was okay. Subsequent days which patient started having the swelling in the left foot second toe which has progressed to the foot and mid calf now. Patient's son-in-law also states that patient's left foot second toe was probed previously by a physician and the infection got worse.  ED Course:In the ER x-rays do not show any bony involvement. On exam patient has erythema of the left foot up to the midcalf. Left foot second toe looks erythematous with possible abscess.   Discharge Diagnoses:  Principal Problem:   Cellulitis of left foot Active Problems:   Tachycardia   Essential hypertension   Dementia (HCC)   Cellulitis   Osteomyelitis of second toe of left foot (HCC)   Abscess, toe, left   Hyperglycemia   Acute kidney injury superimposed on chronic kidney disease (HCC)   Thrombocytopenia ([place])     1 left second toe osteomyelitis with left midfoot cellulitis status post amputation.-Patient seems to be doing well after surgery.  His pain is being  controlled.    Patient received vancomycin and cefepime during the hospital stay.  Vancomycin was stopped due to rising creatinine.  Then he was started on doxycycline.  He will be discharged home on doxycycline for 10 days.  The culture grew MRSA sensitive to doxycycline.   2 hypertension continue metoprolol stable  3 history of dementia on Aricept continue.  4 acute on chronic CKD stage III patient's creatinine improved and back to baseline after slow IV hydration.   5 mild thrombocytopeniaREsolved  Estimated body mass index is 23.01 kg/m as calculated from the following:   Height as of this encounter: 5\' 11"  (1.803 m).   Weight as of this encounter: 74.8 kg.  Discharge Instructions  Discharge Instructions    Call MD for:  difficulty breathing, headache or visual disturbances   Complete by:  As directed    Call MD for:  persistant dizziness or light-headedness   Complete by:  As directed    Call MD for:  persistant nausea and vomiting   Complete by:  As directed    Call MD for:  severe uncontrolled pain   Complete by:  As directed    Diet - low sodium heart healthy   Complete by:  As directed    Increase activity slowly   Complete by:  As directed    Non weight bearing   Complete by:  As directed    Laterality:  left   Extremity:  Lower     Allergies as of 02/04/2018      Reactions   Bee Venom Swelling   Codeine Nausea Only, Nausea And Vomiting, Other (See Comments), Swelling  Other Reaction: Other reaction Other reaction(s): Swelling Other reaction(s): Swelling Other reaction(s): Swelling   Daptomycin Other (See Comments)   Pneumonitis   Clindamycin/lincomycin Rash   Lincomycin Rash   Lactose Other (See Comments), Diarrhea   Other reaction(s): Cramps (ALLERGY/intolerance) Other reaction(s): Cramps (ALLERGY/intolerance) Other reaction(s): Cramps (ALLERGY/intolerance) Other reaction: Cramps (ALLERGY/intolerance)--Pt avoids ALL lactose containing items  including items with lactose baked in. JLS 11/25/16. Other reaction(s): Cramps (ALLERGY/intolerance) Other reaction(s): Cramps (ALLERGY/intolerance) Other reaction(s): Cramps (ALLERGY/intolerance)      Medication List    TAKE these medications   acetaminophen 325 MG tablet Commonly known as:  TYLENOL Take 2 tablets (650 mg total) by mouth every 6 (six) hours as needed for mild pain (or Fever >/= 101).   aspirin 325 MG EC tablet Take 325 mg by mouth every evening.   Cholecalciferol 125 MCG (5000 UT) capsule Take 5,000 Units by mouth daily.   diphenhydrAMINE 25 MG tablet Commonly known as:  BENADRYL Take 25 mg by mouth at bedtime.   donepezil 10 MG tablet Commonly known as:  ARICEPT Take 5 mg by mouth 2 (two) times daily. 0.5 Tablet in AM, 0.5 Tablet at night   doxycycline 50 MG capsule Commonly known as:  VIBRAMYCIN Take 2 capsules (100 mg total) by mouth 2 (two) times daily.   HYDROcodone-acetaminophen 7.5-325 MG tablet Commonly known as:  NORCO Take 1-2 tablets by mouth every 4 (four) hours as needed for severe pain (pain score 7-10).   metoprolol succinate 100 MG 24 hr tablet Commonly known as:  TOPROL-XL Take 100 mg by mouth daily.   saccharomyces boulardii 250 MG capsule Commonly known as:  FLORASTOR Take 1 capsule (250 mg total) by mouth 2 (two) times daily.   sertraline 100 MG tablet Commonly known as:  ZOLOFT Take 100 mg by mouth daily.            Discharge Care Instructions  (From admission, onward)         Start     Ordered   01/31/18 0000  Non weight bearing    Question Answer Comment  Laterality left   Extremity Lower      01/31/18 1330          Contact information for follow-up providers    Newt Minion, MD In 1 week.   Specialty:  Orthopedic Surgery Contact information: [place] [address] [phone]            Contact information for after-discharge care    Destination    [facility] .   Service:  Skilled Nursing Contact information: [address] [place] [address] [phone]                 Allergies  Allergen Reactions  . Bee Venom Swelling  . Codeine Nausea Only, Nausea And Vomiting, Other (See Comments) and Swelling    Other Reaction: Other reaction Other reaction(s): Swelling Other reaction(s): Swelling Other reaction(s): Swelling   . Daptomycin Other (See Comments)    Pneumonitis  . Clindamycin/Lincomycin Rash  . Lincomycin Rash  . Lactose Other (See Comments) and Diarrhea    Other reaction(s): Cramps (ALLERGY/intolerance) Other reaction(s): Cramps (ALLERGY/intolerance) Other reaction(s): Cramps (ALLERGY/intolerance) Other reaction: Cramps (ALLERGY/intolerance)--Pt avoids ALL lactose containing items including items with lactose baked in. JLS 11/25/16. Other reaction(s): Cramps (ALLERGY/intolerance) Other reaction(s): Cramps (ALLERGY/intolerance) Other reaction(s): Cramps (ALLERGY/intolerance)     Consultations: Ortho  Procedures/Studies: Mr Foot Left Wo Contrast  Result Date:  01/30/2018 CLINICAL DATA:  Redness and swelling of the second toe. Evaluate for osteomyelitis. EXAM: MRI OF THE LEFT FOOT WITHOUT CONTRAST TECHNIQUE: Multiplanar, multisequence MR imaging of the left forefoot was performed. No intravenous contrast was administered. COMPARISON:  Left foot x-rays from yesterday. Left foot MRI dated November 03, 2016. FINDINGS: Bones/Joint/Cartilage Prior first ray amputation. Small area of marrow edema at the residual first metatarsal stump. Intense marrow edema with corresponding mildly decreased T1 marrow signal involving the second toe phalanges. Remaining marrow signal is unremarkable. No fracture or dislocation. Normal alignment. No joint effusion. Small amount of fluid in the second and third intermetatarsal bursae. Ligaments Collateral ligaments are intact. Muscles and Tendons Flexor,  peroneal and extensor compartment tendons are intact. Diffuse fatty atrophy of the intrinsic muscles of the forefoot. Soft tissue Diffuse soft tissue swelling of the second toe. Mild dorsal forefoot soft tissue swelling. No fluid collection or hematoma. No soft tissue mass. IMPRESSION: 1. Osteomyelitis of the second toe.  No abscess. 2. Prior first ray amputation with small area of marrow edema at the residual first metatarsal stump, favored reactive in the absence of nearby ulceration. Osteomyelitis is less likely. Electronically Signed   By: Titus Dubin M.D.   On: 01/30/2018 12:13   Dg Foot Complete Left  Result Date: 01/29/2018 CLINICAL DATA:  Cellulitis left foot EXAM: LEFT FOOT - COMPLETE 3+ VIEW COMPARISON:  08/09/2016 FINDINGS: Prior transmetatarsal amputation of the left 1st toe. No acute bony abnormality. No bony destruction to suggest acute osteomyelitis. Plantar calcaneal spur. Soft tissues are intact. IMPRESSION: Prior 1st toe transmetatarsal amputation. No acute bony abnormality. Electronically Signed   By: Rolm Baptise M.D.   On: 01/29/2018 20:55    (Echo, Carotid, EGD, Colonoscopy, ERCP)    Subjective: Very pleasant young man resting in bed in no acute distress anxious to go home no complaints reported no nausea vomiting diarrhea pain reported  Discharge Exam: Vitals:   02/04/18 0429 02/04/18 1017  BP: 112/68 112/68  Pulse: (!) 48 (!) 48  Resp: 19   Temp: 97.8 F (36.6 C)   SpO2: 95%    Vitals:   02/03/18 1719 02/03/18 2131 02/04/18 0429 02/04/18 1017  BP: 118/62 137/69 112/68 112/68  Pulse: (!) 52 (!) 53 (!) 48 (!) 48  Resp: 17 19 19    Temp: 98.6 F (37 C) 98 F (36.7 C) 97.8 F (36.6 C)   TempSrc:  Oral Oral   SpO2: 96% 95% 95%   Weight:      Height:        General: Pt is alert, awake, not in acute distress Cardiovascular: RRR, S1/S2 +, no rubs, no gallops Respiratory: CTA bilaterally, no wheezing, no rhonchi Abdominal: Soft, NT, ND, bowel sounds  + Extremities: no edema, no cyanosis    The results of significant diagnostics from this hospitalization (including imaging, microbiology, ancillary and laboratory) are listed below for reference.     Microbiology: Recent Results (from the past 240 hour(s))  Aerobic Culture (superficial specimen)     Status: None   Collection Time: 01/30/18  2:58 AM  Result Value Ref Range Status   Specimen Description   Final    ABSCESS Performed at [facility] [address], [place], [place] [address]    Special Requests   Final    NONE Performed at [facility], [place] [address] [place], [place] [address]    Gram Stain   Final    RARE WBC PRESENT, PREDOMINANTLY MONONUCLEAR  NO ORGANISMS SEEN Performed at [facility], [place] [address] [place], [place] [address]    Culture RARE METHICILLIN RESISTANT STAPHYLOCOCCUS AUREUS  Final   Report Status 02/02/2018 FINAL  Final   Organism ID, Bacteria METHICILLIN RESISTANT STAPHYLOCOCCUS AUREUS  Final      Susceptibility   Methicillin resistant staphylococcus aureus - MIC*    CIPROFLOXACIN >=8 RESISTANT Resistant     ERYTHROMYCIN >=8 RESISTANT Resistant     GENTAMICIN <=0.5 SENSITIVE Sensitive     OXACILLIN >=4 RESISTANT Resistant     TETRACYCLINE <=1 SENSITIVE Sensitive     VANCOMYCIN 1 SENSITIVE Sensitive     TRIMETH/SULFA >=320 RESISTANT Resistant     CLINDAMYCIN <=0.25 SENSITIVE Sensitive     RIFAMPIN <=0.5 SENSITIVE Sensitive     Inducible Clindamycin NEGATIVE Sensitive     * RARE METHICILLIN RESISTANT STAPHYLOCOCCUS AUREUS     Labs: BNP (last 3 results) No results for input(s): BNP in the last 8760 hours. Basic Metabolic Panel: Recent Labs  Lab 01/30/18 0511 01/31/18 0513 02/01/18 0151 02/03/18 0458 02/04/18 0209  NA 139 138 139 140 136  K 4.1 4.7 4.0 5.1 5.8*  CL 106 105 104 104 105  CO2 23 24 24 25  20*  GLUCOSE 109* 113* 100* 99 81  BUN 21 22 16 22  26*  CREATININE 1.24 1.24 1.10  1.52* 1.19  CALCIUM 8.7* 8.9 8.8* 9.0 8.7*  MG  --   --  2.1  --   --   PHOS  --   --  4.2  --   --    Liver Function Tests: Recent Labs  Lab 02/01/18 0151  AST 20  ALT 15  ALKPHOS 61  BILITOT 0.7  PROT 6.6  ALBUMIN 3.0*   No results for input(s): LIPASE, AMYLASE in the last 168 hours. No results for input(s): AMMONIA in the last 168 hours. CBC: Recent Labs  Lab 01/29/18 2003 01/30/18 0511 01/31/18 0513 02/01/18 0151 02/03/18 0458  WBC 8.9 7.1 6.7 4.7 4.7  NEUTROABS 6.3  --   --  2.8 2.4  HGB 14.0 13.1 14.0 13.3 13.6  HCT 44.1 41.0 43.2 41.2 43.5  MCV 97.4 98.8 98.2 94.1 94.8  PLT 164 145* 145* 159 192   Cardiac Enzymes: No results for input(s): CKTOTAL, CKMB, CKMBINDEX, TROPONINI in the last 168 hours. BNP: Invalid input(s): POCBNP CBG: No results for input(s): GLUCAP in the last 168 hours. D-Dimer No results for input(s): DDIMER in the last 72 hours. Hgb A1c No results for input(s): HGBA1C in the last 72 hours. Lipid Profile No results for input(s): CHOL, HDL, LDLCALC, TRIG, CHOLHDL, LDLDIRECT in the last 72 hours. Thyroid function studies No results for input(s): TSH, T4TOTAL, T3FREE, THYROIDAB in the last 72 hours.  Invalid input(s): FREET3 Anemia work up No results for input(s): VITAMINB12, FOLATE, FERRITIN, TIBC, IRON, RETICCTPCT in the last 72 hours. Urinalysis    Component Value Date/Time   COLORURINE YELLOW 06/15/2017 1528   APPEARANCEUR HAZY (A) 06/15/2017 1528   LABSPEC 1.026 06/15/2017 1528   PHURINE 5.0 06/15/2017 1528   GLUCOSEU NEGATIVE 06/15/2017 1528   HGBUR SMALL (A) 06/15/2017 1528   BILIRUBINUR NEGATIVE 06/15/2017 1528   KETONESUR NEGATIVE 06/15/2017 1528   PROTEINUR 30 (A) 06/15/2017 1528   NITRITE NEGATIVE 06/15/2017 1528   LEUKOCYTESUR NEGATIVE 06/15/2017 1528   Sepsis Labs Invalid input(s): PROCALCITONIN,  WBC,  LACTICIDVEN Microbiology Recent Results (from the past 240 hour(s))  Aerobic Culture (superficial specimen)  Status: None   Collection Time: 01/30/18  2:58 AM  Result Value Ref Range Status   Specimen Description   Final    ABSCESS Performed at [facility] [address] [place], [place] [address]    Special Requests   Final    NONE Performed at [facility], [place] [address] [place], [place] [address]    Gram Stain   Final    RARE WBC PRESENT, PREDOMINANTLY MONONUCLEAR NO ORGANISMS SEEN Performed at [facility], [place] [address] [place], [place] [address]    Culture RARE METHICILLIN RESISTANT STAPHYLOCOCCUS AUREUS  Final   Report Status 02/02/2018 FINAL  Final   Organism ID, Bacteria METHICILLIN RESISTANT STAPHYLOCOCCUS AUREUS  Final      Susceptibility   Methicillin resistant staphylococcus aureus - MIC*    CIPROFLOXACIN >=8 RESISTANT Resistant     ERYTHROMYCIN >=8 RESISTANT Resistant     GENTAMICIN <=0.5 SENSITIVE Sensitive     OXACILLIN >=4 RESISTANT Resistant     TETRACYCLINE <=1 SENSITIVE Sensitive     VANCOMYCIN 1 SENSITIVE Sensitive     TRIMETH/SULFA >=320 RESISTANT Resistant     CLINDAMYCIN <=0.25 SENSITIVE Sensitive     RIFAMPIN <=0.5 SENSITIVE Sensitive     Inducible Clindamycin NEGATIVE Sensitive     * RARE METHICILLIN RESISTANT STAPHYLOCOCCUS AUREUS     Time coordinating discharge:  33 minutes  SIGNED:   Georgette Shell, MD  [facility] 02/04/2018, 12:56 PM Pager   If 7PM-7AM, please contact night-coverage [email or web address] Password [email or web address]

## 2018-02-04 NOTE — Progress Notes (Signed)
Clinical Social Worker following patient for support and discharge needs. CSW reached out to [facility] and they stated they are unable to take patient today as they do not have anyone to admit him. [facility] stated they will be able to take patient tomorrow morning. CSW will reach to patients daughter to make her aware of discharge plan.   Rhea Pink, MSW,  [place]

## 2018-02-05 LAB — MRSA PCR SCREENING: MRSA by PCR: POSITIVE — AB

## 2018-02-05 NOTE — Progress Notes (Signed)
PROGRESS NOTE    Joel Ibarra  IWP:[id number] DOB: 10-20-1934 DOA: 01/29/2018 PCP: System, Pcp Not In  Brief Narrative:  83 y.o.malewithhistory of hypertension dementia was brought to the ER after patient had progressively worsening erythema and swelling of the left foot second toe which is moving progressively proximally and now is involving up to the midcalf. Denies any trauma or fall. As per patient's son-in-law who is providing the history when patient visited last month or no weight with PCP patient's leg was okay. Subsequent days which patient started having the swelling in the left foot second toe which has progressed to the foot and mid calf now. Patient's son-in-law also states that patient's left foot second toe was probed previously by a physician and the infection got worse.  ED Course:In the ER x-rays do not show any bony involvement. On exam patient has erythema of the left foot up to the midcalf. Left foot second toe looks erythematous with possible abscess.  Assessment & Plan:   Principal Problem:   Cellulitis of left foot Active Problems:   Tachycardia   Essential hypertension   Dementia (HCC)   Cellulitis   Osteomyelitis of second toe of left foot (HCC)   Abscess, toe, left   Hyperglycemia   Acute kidney injury superimposed on chronic kidney disease (HCC)   Thrombocytopenia ([place])     1 left second toe osteomyelitis with left midfoot cellulitis status post amputation.-Patient seems to be doing well after surgery.  His pain is being controlled.  He is on vancomycin and cefepime but with the rising creatinine I will DC the V[place] and place him on doxyCYCLINE.  2 hypertension continue metoprolol stable  3 history of dementia on Aricept continue.  4 acute on chronic CKD stage III patient's creatinine has bumped up probably secondary to decreased p.o. intake will hydrate him and DC vancomycin.  5 mild thrombocytopeniaREsolved      Malnutrition  Characteristics:      Nutrition Interventions:     Estimated body mass index is 23.01 kg/m as calculated from the following:   Height as of this encounter: 5\' 11"  (1.803 m).   Weight as of this encounter: 74.8 kg.  DVT prophylaxis: lovenox Code Status:dnr Family Communication: dw daughter Disposition Plan:pending placement   Consultnts:ortho   Procedures:left second toe amputation Antimicrobials:doxy  Subjective:resting in bed no complaints  Objective: Vitals:   02/04/18 1017 02/04/18 1529 02/04/18 2054 02/05/18 0839  BP: 112/68 133/69 (!) 144/74 132/71  Pulse: (!) 48 (!) 55 (!) 55 62  Resp:  16 20   Temp:  98.2 F (36.8 C) 97.8 F (36.6 C)   TempSrc:  Oral Oral   SpO2:  96% 97%   Weight:      Height:        Intake/Output Summary (Last 24 hours) at 02/05/2018 1107 Last data filed at 02/04/2018 1737 Gross per 24 hour  Intake 720 ml  Output 500 ml  Net 220 ml   Filed Weights   01/29/18 1751 01/29/18 1752  Weight: 72.6 kg 74.8 kg    Examination:  General exam: Appears calm and comfortable  Respiratory system: Clear to auscultation. Respiratory effort normal. Cardiovascular system: S1 & S2 heard, RRR. No JVD, murmurs, rubs, gallops or clicks. No pedal edema. Gastrointestinal system: Abdomen is nondistended, soft and nontender. No organomegaly or masses felt. Normal bowel sounds heard. Central nervous system: Alert and oriented. No focal neurological deficits. Extremities:left foot dressings on Skin: No rashes, lesions or ulcers Psychiatry: Judgement and  insight appear normal. Mood & affect appropriate.     Data Reviewed: I have personally reviewed following labs and imaging studies  CBC: Recent Labs  Lab 01/29/18 2003 01/30/18 0511 01/31/18 0513 02/01/18 0151 02/03/18 0458  WBC 8.9 7.1 6.7 4.7 4.7  NEUTROABS 6.3  --   --  2.8 2.4  HGB 14.0 13.1 14.0 13.3 13.6  HCT 44.1 41.0 43.2 41.2 43.5  MCV 97.4 98.8 98.2 94.1 94.8  PLT 164 145* 145* 159  037   Basic Metabolic Panel: Recent Labs  Lab 01/30/18 0511 01/31/18 0513 02/01/18 0151 02/03/18 0458 02/04/18 0209  NA 139 138 139 140 136  K 4.1 4.7 4.0 5.1 5.8*  CL 106 105 104 104 105  CO2 23 24 24 25  20*  GLUCOSE 109* 113* 100* 99 81  BUN 21 22 16 22  26*  CREATININE 1.24 1.24 1.10 1.52* 1.19  CALCIUM 8.7* 8.9 8.8* 9.0 8.7*  MG  --   --  2.1  --   --   PHOS  --   --  4.2  --   --    GFR: Estimated Creatinine Clearance: 51.5 mL/min (by C-G formula based on SCr of 1.19 mg/dL). Liver Function Tests: Recent Labs  Lab 02/01/18 0151  AST 20  ALT 15  ALKPHOS 61  BILITOT 0.7  PROT 6.6  ALBUMIN 3.0*   No results for input(s): LIPASE, AMYLASE in the last 168 hours. No results for input(s): AMMONIA in the last 168 hours. Coagulation Profile: No results for input(s): INR, PROTIME in the last 168 hours. Cardiac Enzymes: No results for input(s): CKTOTAL, CKMB, CKMBINDEX, TROPONINI in the last 168 hours. BNP (last 3 results) No results for input(s): PROBNP in the last 8760 hours. HbA1C: No results for input(s): HGBA1C in the last 72 hours. CBG: No results for input(s): GLUCAP in the last 168 hours. Lipid Profile: No results for input(s): CHOL, HDL, LDLCALC, TRIG, CHOLHDL, LDLDIRECT in the last 72 hours. Thyroid Function Tests: No results for input(s): TSH, T4TOTAL, FREET4, T3FREE, THYROIDAB in the last 72 hours. Anemia Panel: No results for input(s): VITAMINB12, FOLATE, FERRITIN, TIBC, IRON, RETICCTPCT in the last 72 hours. Sepsis Labs: No results for input(s): PROCALCITON, LATICACIDVEN in the last 168 hours.  Recent Results (from the past 240 hour(s))  Aerobic Culture (superficial specimen)     Status: None   Collection Time: 01/30/18  2:58 AM  Result Value Ref Range Status   Specimen Description   Final    ABSCESS Performed at [facility] [address], [place], [place] [address]    Special Requests   Final    NONE Performed at [facility], [place] [address], [place], [place] [address]    Gram Stain   Final    RARE WBC PRESENT, PREDOMINANTLY MONONUCLEAR NO ORGANISMS SEEN Performed at [facility], [place] [address], [place],  [address]    Culture RARE METHICILLIN RESISTANT STAPHYLOCOCCUS AUREUS  Final   Report Status 02/02/2018 FINAL  Final   Organism ID, Bacteria METHICILLIN RESISTANT STAPHYLOCOCCUS AUREUS  Final      Susceptibility   Methicillin resistant staphylococcus aureus - MIC*    CIPROFLOXACIN >=8 RESISTANT Resistant     ERYTHROMYCIN >=8 RESISTANT Resistant     GENTAMICIN <=0.5 SENSITIVE Sensitive     OXACILLIN >=4 RESISTANT Resistant     TETRACYCLINE <=1 SENSITIVE Sensitive     VANCOMYCIN 1 SENSITIVE Sensitive     TRIMETH/SULFA >=320 RESISTANT Resistant  CLINDAMYCIN <=0.25 SENSITIVE Sensitive     RIFAMPIN <=0.5 SENSITIVE Sensitive     Inducible Clindamycin NEGATIVE Sensitive     * RARE METHICILLIN RESISTANT STAPHYLOCOCCUS AUREUS  MRSA PCR Screening     Status: Abnormal   Collection Time: 02/05/18  8:44 AM  Result Value Ref Range Status   MRSA by PCR POSITIVE (A) NEGATIVE Final    Comment:        The GeneXpert MRSA Assay (FDA approved for NASAL specimens only), is one component of a comprehensive MRSA colonization surveillance program. It is not intended to diagnose MRSA infection nor to guide or monitor treatment for MRSA infections. RESULT CALLED TO, READ BACK BY AND VERIFIED WITH: Garlon Hatchet RN 10:45 02/05/18 ([email or web address]) Performed at [facility], [place] [address] [place], [place] [address]          Radiology Studies: No results found.      Scheduled Meds: . aspirin  325 mg Oral QPM  . cholecalciferol  5,000 Units Oral Daily  . diphenhydrAMINE  25 mg Oral QHS  . docusate sodium  100 mg Oral BID  . donepezil  5 mg Oral BID  . metoprolol succinate  100 mg Oral Daily  . saccharomyces boulardii  250 mg Oral BID  . sertraline  100 mg Oral Daily    Continuous Infusions: . sodium chloride Stopped (01/30/18 1545)  . sodium chloride 10 mL/hr at 02/01/18 1445  . sodium chloride 100 mL/hr at 02/03/18 1548  . ceFEPime (MAXIPIME) IV 2 g (02/05/18 0523)  . doxycycline (VIBRAMYCIN) IV 100 mg (02/05/18 0408)  . lactated ringers 50 mL/hr at 01/31/18 1125  . methocarbamol (ROBAXIN) IV       LOS: 7 days     Georgette Shell, MD [facility]  If 7PM-7AM, please contact night-coverage [email or web address] Password [email or web address] 02/05/2018, 11:07 AM

## 2018-02-05 NOTE — Progress Notes (Signed)
Patient will DC to: [facility] Anticipated DC date: 02/05/18 Family notified: [name] by: family will take to facility  Per MD patient ready for DC to [facility]. RN, patient, patient's family, and facility notified of DC. Discharge Summary sent to facility. RN given number for report [phone] Room 7014. DC packet on chart. Patient's family in route to transport.  CSW signing off.  [place], [place]

## 2018-02-05 NOTE — Plan of Care (Signed)
  Problem: Nutrition: Goal: Adequate nutrition will be maintained Outcome: Progressing   Problem: Safety: Goal: Ability to remain free from injury will improve Outcome: Progressing   Problem: Skin Integrity: Goal: Risk for impaired skin integrity will decrease Outcome: Progressing   

## 2018-02-05 NOTE — Clinical Social Work Placement (Signed)
   CLINICAL SOCIAL WORK PLACEMENT  NOTE  Date:  02/05/2018  Patient Details  Name: Joel Ibarra MRN: [id number] Date of Birth: Jan 29, 1935  Clinical Social Work is seeking post-discharge placement for this patient at the [place] level of care (*CSW will initial, date and re-position this form in  chart as items are completed):      Patient/family provided with [place] Work Department's list of facilities offering this level of care within the geographic area requested by the patient (or if unable, by the patient's family).  Yes   Patient/family informed of their freedom to choose among providers that offer the needed level of care, that participate in Medicare, Medicaid or managed care program needed by the patient, have an available bed and are willing to accept the patient.      Patient/family informed of [facility]'s ownership interest in [facility] and [facility], as well as of the fact that they are under no obligation to receive care at these facilities.  PASRR submitted to EDS on       PASRR number received on 02/02/18     Existing PASRR number confirmed on       FL2 transmitted to all facilities in geographic area requested by pt/family on 02/02/18     FL2 transmitted to all facilities within larger geographic area on       Patient informed that his/her managed care company has contracts with or will negotiate with certain facilities, including the following:        Yes   Patient/family informed of bed offers received.  Patient chooses bed at [facility] recommends and patient chooses bed at      Patient to be transferred to [facility] [place] on 02/05/18.  Patient to be transferred to facility by [facility]     Patient family notified on 02/05/18 of transfer.  Name of family member notified:  [name] (daughter)     PHYSICIAN Please sign DNR     Additional Comment:     _______________________________________________ Alberteen Sam, LCSW 02/05/2018, 11:04 AM

## 2018-02-05 NOTE — Progress Notes (Signed)
Patient discharging to [facility]. Discharge instructions given to son in law. Report called in to nurse. Left hospital via personal car.

## 2018-02-15 ENCOUNTER — Encounter (INDEPENDENT_AMBULATORY_CARE_PROVIDER_SITE_OTHER): Payer: Self-pay | Admitting: Orthopedic Surgery

## 2018-02-15 ENCOUNTER — Ambulatory Visit (INDEPENDENT_AMBULATORY_CARE_PROVIDER_SITE_OTHER): Payer: Medicare Other | Admitting: Physician Assistant

## 2018-02-15 VITALS — Ht 71.0 in | Wt 165.0 lb

## 2018-02-15 DIAGNOSIS — Z89422 Acquired absence of other left toe(s): Secondary | ICD-10-CM

## 2018-02-16 ENCOUNTER — Encounter (INDEPENDENT_AMBULATORY_CARE_PROVIDER_SITE_OTHER): Payer: Self-pay | Admitting: Physician Assistant

## 2018-02-16 NOTE — Progress Notes (Signed)
Office Visit Note   Patient: Joel Ibarra           Date of Birth: 1934/11/04           MRN: [id number] Visit Date: 02/15/2018              Requested by: No referring provider defined for this encounter. PCP: System, Pcp Not In  Chief Complaint  Patient presents with  . Left Foot - Pain      HPI: The patient is a 83 yo gentleman here with his wife for post operative follow up following left foot 2nd toe amputation on 01/31/2018 for osteomyelitis. He is in rehab at [facility]. He was on Doxycycline for 10 days post op. He reports no pain over the area.   Assessment & Plan: Visit Diagnoses:  1. Acquired absence of other left toe(s) ([place])     Plan: Continue to off load the left foot as much as possible and elevate as much as possible. Continue to work with rehab/PT to mobilize. Follow up here in 1 week.   Follow-Up Instructions: Return in about 1 week (around 02/22/2018).   Ortho Exam  Patient is alert, oriented, no adenopathy, well-dressed, normal affect, normal respiratory effort. Left 2nd toe amputation site has mild edema, sutures intact. Scant serous drainage. No signs of infection. Mild dried crusting about the incision line. Palpable pedal pulses.   Imaging: No results found. No images are attached to the encounter.  Labs: Lab Results  Component Value Date   HGBA1C 5.5 06/15/2017   REPTSTATUS 02/02/2018 FINAL 01/30/2018   GRAMSTAIN  01/30/2018    RARE WBC PRESENT, PREDOMINANTLY MONONUCLEAR NO ORGANISMS SEEN Performed at [facility] [address] [place], [place] [address]    CULT RARE METHICILLIN RESISTANT STAPHYLOCOCCUS AUREUS 01/30/2018   LABORGA METHICILLIN RESISTANT STAPHYLOCOCCUS AUREUS 01/30/2018     Lab Results  Component Value Date   ALBUMIN 3.0 (L) 02/01/2018   ALBUMIN 3.7 06/15/2017   ALBUMIN 4.0 06/14/2017    Body mass index is 23.01 kg/m.  Orders:  No orders of the defined types were placed in this encounter.  No orders  of the defined types were placed in this encounter.    Procedures: No procedures performed  Clinical Data: No additional findings.  ROS:  All other systems negative, except as noted in the HPI. Review of Systems  Objective: Vital Signs: Ht 5\' 11"  (1.803 m)   Wt 165 lb (74.8 kg)   BMI 23.01 kg/m   Specialty Comments:  No specialty comments available.  PMFS History: Patient Active Problem List   Diagnosis Date Noted  . Hyperglycemia 01/31/2018  . Acute kidney injury superimposed on chronic kidney disease ([place]) 01/31/2018  . Thrombocytopenia ([place]) 01/31/2018  . Osteomyelitis of second toe of left foot ([place])   . Abscess, toe, left   . Cellulitis of left foot 01/29/2018  . Essential hypertension 01/29/2018  . Dementia ([place]) 01/29/2018  . Cellulitis 01/29/2018  . Diarrhea 06/15/2017  . Nausea & vomiting 06/15/2017  . Tachycardia 06/15/2017   Past Medical History:  Diagnosis Date  . Cancer ([place])   . Dementia ([place])   . Hypertension   . Kidney stones     Family History  Family history unknown: Yes    Past Surgical History:  Procedure Laterality Date  . AMPUTATION Left 01/31/2018   Procedure: LEFT FOOT SECOND TOE AMPUTATION;  Surgeon: Newt Minion, MD;  Location: [facility];  Service: Orthopedics;  Laterality: Left;  .  TOE AMPUTATION     Social History   Occupational History  . Not on file  Tobacco Use  . Smoking status: Former Research scientist (life sciences)  . Smokeless tobacco: Never Used  Substance and Sexual Activity  . Alcohol use: No  . Drug use: Never  . Sexual activity: Not on file

## 2018-02-22 ENCOUNTER — Ambulatory Visit (INDEPENDENT_AMBULATORY_CARE_PROVIDER_SITE_OTHER): Payer: Medicare Other | Admitting: Physician Assistant

## 2018-02-22 ENCOUNTER — Encounter (INDEPENDENT_AMBULATORY_CARE_PROVIDER_SITE_OTHER): Payer: Self-pay | Admitting: Physician Assistant

## 2018-02-22 DIAGNOSIS — Z89422 Acquired absence of other left toe(s): Secondary | ICD-10-CM

## 2018-02-22 NOTE — Progress Notes (Signed)
Office Visit Note   Patient: Joel Ibarra           Date of Birth: Dec 14, 1934           MRN: [id number] Visit Date: 02/22/2018              Requested by: No referring provider defined for this encounter. PCP: Jilda Panda, MD  No chief complaint on file.     HPI: The patient is a 83 yo gentleman here with his son in law for post operative follow up following left foot 2nd toe amputation on 01/31/2018 for osteomyelitis. He continues in rehab at [facility]. He completed a course of Doxycycline and has been off loading the area and elevating the left foot as much as possible. He reports no pain over the area.   Assessment & Plan: Visit Diagnoses:  1. Acquired absence of other left toe(s) ([place])     Plan: Sutures were removed this visit. Continue off loading as much as possible and elevate as much as possible. Will plan to see back in about 11 days.   Follow-Up Instructions: Return in about 11 days (around 03/05/2018).   Ortho Exam  Patient is alert, oriented, no adenopathy, well-dressed, normal affect, normal respiratory effort. Left great toe amputation site is healing well with minimal old dried blood at the site. No signs of infection or cellulitis. Good palpable dorsalis pedis and posterior tibialis pulse, but the foot is cool to touch. Sutures removed this visit.   Imaging: No results found. No images are attached to the encounter.  Labs: Lab Results  Component Value Date   HGBA1C 5.5 06/15/2017   REPTSTATUS 02/02/2018 FINAL 01/30/2018   GRAMSTAIN  01/30/2018    RARE WBC PRESENT, PREDOMINANTLY MONONUCLEAR NO ORGANISMS SEEN Performed at [facility] [address], [place], [place] [address]    CULT RARE METHICILLIN RESISTANT STAPHYLOCOCCUS AUREUS 01/30/2018   LABORGA METHICILLIN RESISTANT STAPHYLOCOCCUS AUREUS 01/30/2018     Lab Results  Component Value Date   ALBUMIN 3.0 (L) 02/01/2018   ALBUMIN 3.7 06/15/2017   ALBUMIN 4.0 06/14/2017    There is  no height or weight on file to calculate BMI.  Orders:  No orders of the defined types were placed in this encounter.  No orders of the defined types were placed in this encounter.    Procedures: No procedures performed  Clinical Data: No additional findings.  ROS:  All other systems negative, except as noted in the HPI. Review of Systems  Objective: Vital Signs: There were no vitals taken for this visit.  Specialty Comments:  No specialty comments available.  PMFS History: Patient Active Problem List   Diagnosis Date Noted  . Hyperglycemia 01/31/2018  . Acute kidney injury superimposed on chronic kidney disease ([place]) 01/31/2018  . Thrombocytopenia ([place]) 01/31/2018  . Osteomyelitis of second toe of left foot ([place])   . Abscess, toe, left   . Cellulitis of left foot 01/29/2018  . Essential hypertension 01/29/2018  . Dementia ([place]) 01/29/2018  . Cellulitis 01/29/2018  . Diarrhea 06/15/2017  . Nausea & vomiting 06/15/2017  . Tachycardia 06/15/2017   Past Medical History:  Diagnosis Date  . Cancer ([place])   . Dementia ([place])   . Hypertension   . Kidney stones     Family History  Family history unknown: Yes    Past Surgical History:  Procedure Laterality Date  . AMPUTATION Left 01/31/2018   Procedure: LEFT FOOT SECOND TOE AMPUTATION;  Surgeon: Meridee Score  V, MD;  Location: [facility];  Service: Orthopedics;  Laterality: Left;  . TOE AMPUTATION     Social History   Occupational History  . Not on file  Tobacco Use  . Smoking status: Former Research scientist (life sciences)  . Smokeless tobacco: Never Used  Substance and Sexual Activity  . Alcohol use: No  . Drug use: Never  . Sexual activity: Not on file

## 2018-03-05 ENCOUNTER — Encounter (INDEPENDENT_AMBULATORY_CARE_PROVIDER_SITE_OTHER): Payer: Self-pay | Admitting: Physician Assistant

## 2018-03-05 ENCOUNTER — Ambulatory Visit (INDEPENDENT_AMBULATORY_CARE_PROVIDER_SITE_OTHER): Payer: Medicare Other | Admitting: Physician Assistant

## 2018-03-05 VITALS — Ht 71.0 in | Wt 165.0 lb

## 2018-03-05 DIAGNOSIS — Z89422 Acquired absence of other left toe(s): Secondary | ICD-10-CM

## 2018-03-05 NOTE — Progress Notes (Signed)
Office Visit Note   Patient: Joel Ibarra           Date of Birth: [birth year]           MRN: [id number] Visit Date: 03/05/2018              Requested by: Jilda Panda, MD [address] [place], [place] [address] PCP: Jilda Panda, MD  Chief Complaint  Patient presents with  . Left Foot - Routine Post Op      HPI: The patient is a 85 yo gentleman here with son in law for post operative follow up following left foot 2nd toe amputation on 01/31/18 for osteomyelitis. He is in rehab at [facility]. He has been off loading and elevating as much as possible. He reports no pain over the area.   Assessment & Plan: Visit Diagnoses:  1. Acquired absence of other left toe(s) [facility]     Plan: May shower and wash left foot amputation site with Dial soap and water and apply shea butter. May begin weight bearing as tolerated with walker with PT in regular shoe or post op shoe.Orders were sent to [facility].  Follow up in 3 weeks.   Follow-Up Instructions: Return in about 3 weeks (around 03/26/2018).   Ortho Exam  Patient is alert, oriented, no adenopathy, well-dressed, normal affect, normal respiratory effort. The left foot 2nd toe amputation site is healing well and without signs of infection or cellulitis. No edema, good pedal pulses.   Imaging: No results found. No images are attached to the encounter.  Labs: Lab Results  Component Value Date   HGBA1C 5.5 06/15/2017   REPTSTATUS 02/02/2018 FINAL 01/30/2018   GRAMSTAIN  01/30/2018    RARE WBC PRESENT, PREDOMINANTLY MONONUCLEAR NO ORGANISMS SEEN Performed at [facility] [address] [place], [place] [address]    CULT RARE METHICILLIN RESISTANT STAPHYLOCOCCUS AUREUS 01/30/2018   LABORGA METHICILLIN RESISTANT STAPHYLOCOCCUS AUREUS 01/30/2018     Lab Results  Component Value Date   ALBUMIN 3.0 (L) 02/01/2018   ALBUMIN 3.7 06/15/2017   ALBUMIN 4.0 06/14/2017    Body mass index is 23.01 kg/m.  Orders:  No  orders of the defined types were placed in this encounter.  No orders of the defined types were placed in this encounter.    Procedures: No procedures performed  Clinical Data: No additional findings.  ROS:  All other systems negative, except as noted in the HPI. Review of Systems  Objective: Vital Signs: Ht 5\' 11"  (1.803 m)   Wt 165 lb (74.8 kg)   BMI 23.01 kg/m   Specialty Comments:  No specialty comments available.  PMFS History: Patient Active Problem List   Diagnosis Date Noted  . Hyperglycemia 01/31/2018  . Acute kidney injury superimposed on chronic kidney disease ([place]) 01/31/2018  . Thrombocytopenia ([place]) 01/31/2018  . Osteomyelitis of second toe of left foot ([place])   . Abscess, toe, left   . Cellulitis of left foot 01/29/2018  . Essential hypertension 01/29/2018  . Dementia ([place]) 01/29/2018  . Cellulitis 01/29/2018  . Diarrhea 06/15/2017  . Nausea & vomiting 06/15/2017  . Tachycardia 06/15/2017   Past Medical History:  Diagnosis Date  . Cancer ([place])   . Dementia ([place])   . Hypertension   . Kidney stones     Family History  Family history unknown: Yes    Past Surgical History:  Procedure Laterality Date  . AMPUTATION Left 01/31/2018   Procedure: LEFT FOOT SECOND TOE AMPUTATION;  Surgeon: Newt Minion, MD;  Location: [facility];  Service: Orthopedics;  Laterality: Left;  . TOE AMPUTATION     Social History   Occupational History  . Not on file  Tobacco Use  . Smoking status: Former Research scientist (life sciences)  . Smokeless tobacco: Never Used  Substance and Sexual Activity  . Alcohol use: No  . Drug use: Never  . Sexual activity: Not on file

## 2018-03-09 ENCOUNTER — Telehealth (INDEPENDENT_AMBULATORY_CARE_PROVIDER_SITE_OTHER): Payer: Self-pay | Admitting: Physician Assistant

## 2018-03-09 NOTE — Telephone Encounter (Signed)
Patient Request  Joel Ibarra Rile to help patient walk

## 2018-03-12 ENCOUNTER — Telehealth (INDEPENDENT_AMBULATORY_CARE_PROVIDER_SITE_OTHER): Payer: Self-pay

## 2018-03-12 ENCOUNTER — Telehealth (INDEPENDENT_AMBULATORY_CARE_PROVIDER_SITE_OTHER): Payer: Self-pay | Admitting: Orthopedic Surgery

## 2018-03-12 NOTE — Telephone Encounter (Signed)
I called patient's son-in-law and advised him he can pick-up Rx at the front desk.

## 2018-03-12 NOTE — Telephone Encounter (Signed)
Request a Rx for a rollator 4 wheel with pouch & padded seat(wlker with brakes) to go to [facility].

## 2018-03-13 NOTE — Telephone Encounter (Signed)
This has been done.

## 2018-03-26 ENCOUNTER — Ambulatory Visit (INDEPENDENT_AMBULATORY_CARE_PROVIDER_SITE_OTHER): Payer: Medicare Other | Admitting: Orthopedic Surgery

## 2018-03-26 ENCOUNTER — Encounter (INDEPENDENT_AMBULATORY_CARE_PROVIDER_SITE_OTHER): Payer: Self-pay | Admitting: Orthopedic Surgery

## 2018-03-26 DIAGNOSIS — Z89422 Acquired absence of other left toe(s): Secondary | ICD-10-CM

## 2018-03-26 NOTE — Progress Notes (Signed)
Office Visit Note   Patient: Joel Ibarra           Date of Birth: [birth year]           MRN: [id number] Visit Date: 03/26/2018              Requested by: Jilda Panda, MD [address] [place], [place] [address] PCP: Jilda Panda, MD  Chief Complaint  Patient presents with  . Left Foot - Routine Post Op      HPI: Patient is an 84 year old gentleman status post great toe and second toe amputation.  He is currently at [facility] burn has physical therapy he has no complaints  Assessment & Plan: Visit Diagnoses:  1. Acquired absence of other left toe(s) ([place])     Plan: Recommended scar massage recommended a stiff soled sneaker with over-the-counter orthotics recommended will order alpaca socks to work away the moisture.  Follow-Up Instructions: Return if symptoms worsen or fail to improve.   Ortho Exam  Patient is alert, oriented, no adenopathy, well-dressed, normal affect, normal respiratory effort. On examination patient surgical incision is well-healed he has good dorsiflexion of the ankle there is no cellulitis no open wounds no drainage no signs of infection.  Patient has a good dorsalis pedis pulse  Imaging: No results found. No images are attached to the encounter.  Labs: Lab Results  Component Value Date   HGBA1C 5.5 06/15/2017   REPTSTATUS 02/02/2018 FINAL 01/30/2018   GRAMSTAIN  01/30/2018    RARE WBC PRESENT, PREDOMINANTLY MONONUCLEAR NO ORGANISMS SEEN Performed at [facility] [address], [place], [place] [address]    CULT RARE METHICILLIN RESISTANT STAPHYLOCOCCUS AUREUS 01/30/2018   LABORGA METHICILLIN RESISTANT STAPHYLOCOCCUS AUREUS 01/30/2018     Lab Results  Component Value Date   ALBUMIN 3.0 (L) 02/01/2018   ALBUMIN 3.7 06/15/2017   ALBUMIN 4.0 06/14/2017    There is no height or weight on file to calculate BMI.  Orders:  No orders of the defined types were placed in this encounter.  No orders of the defined types were placed  in this encounter.    Procedures: No procedures performed  Clinical Data: No additional findings.  ROS:  All other systems negative, except as noted in the HPI. Review of Systems  Objective: Vital Signs: There were no vitals taken for this visit.  Specialty Comments:  No specialty comments available.  PMFS History: Patient Active Problem List   Diagnosis Date Noted  . Hyperglycemia 01/31/2018  . Acute kidney injury superimposed on chronic kidney disease ([place]) 01/31/2018  . Thrombocytopenia ([place]) 01/31/2018  . Osteomyelitis of second toe of left foot ([place])   . Abscess, toe, left   . Cellulitis of left foot 01/29/2018  . Essential hypertension 01/29/2018  . Dementia ([place]) 01/29/2018  . Cellulitis 01/29/2018  . Diarrhea 06/15/2017  . Nausea & vomiting 06/15/2017  . Tachycardia 06/15/2017   Past Medical History:  Diagnosis Date  . Cancer ([place])   . Dementia ([place])   . Hypertension   . Kidney stones     Family History  Family history unknown: Yes    Past Surgical History:  Procedure Laterality Date  . AMPUTATION Left 01/31/2018   Procedure: LEFT FOOT SECOND TOE AMPUTATION;  Surgeon: Newt Minion, MD;  Location: [facility];  Service: Orthopedics;  Laterality: Left;  . TOE AMPUTATION     Social History   Occupational History  . Not on file  Tobacco Use  . Smoking status: Former  Smoker  . Smokeless tobacco: Never Used  Substance and Sexual Activity  . Alcohol use: No  . Drug use: Never  . Sexual activity: Not on file

## 2018-08-23 ENCOUNTER — Encounter: Payer: Self-pay | Admitting: Physician Assistant

## 2018-08-23 ENCOUNTER — Ambulatory Visit (INDEPENDENT_AMBULATORY_CARE_PROVIDER_SITE_OTHER): Payer: Medicare Other | Admitting: Orthopedic Surgery

## 2018-08-23 ENCOUNTER — Other Ambulatory Visit: Payer: Self-pay

## 2018-08-23 VITALS — Ht 71.0 in | Wt 165.0 lb

## 2018-08-23 DIAGNOSIS — M869 Osteomyelitis, unspecified: Secondary | ICD-10-CM

## 2018-08-23 NOTE — Progress Notes (Signed)
Office Visit Note   Patient: Joel Ibarra           Date of Birth: Jun 06, 1934           MRN: [id number] Visit Date: 08/23/2018              Requested by: Jilda Panda, MD [address] [place],  [place] [address] PCP: Jilda Panda, MD  Chief Complaint  Patient presents with  . Left Foot - Follow-up      HPI: Patient is an 84 year old gentleman who presents with his daughter with ulceration redness and swelling left foot third toe is status post great toe and second toe amputation.  Patient states he has had some drainage from the toe as well.  Assessment & Plan: Visit Diagnoses:  1. Osteomyelitis of third toe of left foot ([place])     Plan: Discussed recommendation to proceed with amputation of third toe due to osteomyelitis and cellulitis.  Patient and his daughter state they understand and wish to proceed with surgery at this time.  We will plan for surgery next Wednesday outpatient surgery.  Risk and benefits were discussed including nonhealing and need for additional surgery.  Follow-Up Instructions: Return in about 2 weeks (around 09/06/2018).   Ortho Exam  Patient is alert, oriented, no adenopathy, well-dressed, normal affect, normal respiratory effort. Examination patient has a strong dorsalis pedis and posterior tibial pulse the great toe and second toe amputation sites have healed nicely.  He has good dorsiflexion of the ankle just past neutral.  There is red sausage digit swelling of the third toe with an ulcer that probes to bone of the tuft of the third toe.  Imaging: No results found. No images are attached to the encounter.  Labs: Lab Results  Component Value Date   HGBA1C 5.5 06/15/2017   REPTSTATUS 02/02/2018 FINAL 01/30/2018   GRAMSTAIN  01/30/2018    RARE WBC PRESENT, PREDOMINANTLY MONONUCLEAR NO ORGANISMS SEEN Performed at [facility] [address], [place], [place] [address]    CULT RARE METHICILLIN RESISTANT STAPHYLOCOCCUS AUREUS  01/30/2018   LABORGA METHICILLIN RESISTANT STAPHYLOCOCCUS AUREUS 01/30/2018     Lab Results  Component Value Date   ALBUMIN 3.0 (L) 02/01/2018   ALBUMIN 3.7 06/15/2017   ALBUMIN 4.0 06/14/2017    Body mass index is 23.01 kg/m.  Orders:  No orders of the defined types were placed in this encounter.  No orders of the defined types were placed in this encounter.    Procedures: No procedures performed  Clinical Data: No additional findings.  ROS:  All other systems negative, except as noted in the HPI. Review of Systems  Objective: Vital Signs: Ht 5\' 11"  (1.803 m)   Wt 165 lb (74.8 kg)   BMI 23.01 kg/m   Specialty Comments:  No specialty comments available.  PMFS History: Patient Active Problem List   Diagnosis Date Noted  . Hyperglycemia 01/31/2018  . Acute kidney injury superimposed on chronic kidney disease ([place]) 01/31/2018  . Thrombocytopenia ([place]) 01/31/2018  . Osteomyelitis of second toe of left foot ([place])   . Abscess, toe, left   . Cellulitis of left foot 01/29/2018  . Essential hypertension 01/29/2018  . Dementia ([place]) 01/29/2018  . Cellulitis 01/29/2018  . Diarrhea 06/15/2017  . Nausea & vomiting 06/15/2017  . Tachycardia 06/15/2017   Past Medical History:  Diagnosis Date  . Cancer ([place])   . Dementia ([place])   . Hypertension   . Kidney stones  Family History  Family history unknown: Yes    Past Surgical History:  Procedure Laterality Date  . AMPUTATION Left 01/31/2018   Procedure: LEFT FOOT SECOND TOE AMPUTATION;  Surgeon: Newt Minion, MD;  Location: [facility];  Service: Orthopedics;  Laterality: Left;  . TOE AMPUTATION     Social History   Occupational History  . Not on file  Tobacco Use  . Smoking status: Former Research scientist (life sciences)  . Smokeless tobacco: Never Used  Substance and Sexual Activity  . Alcohol use: No  . Drug use: Never  . Sexual activity: Not on file

## 2018-08-27 ENCOUNTER — Other Ambulatory Visit (HOSPITAL_COMMUNITY)
Admission: RE | Admit: 2018-08-27 | Discharge: 2018-08-27 | Disposition: A | Discharge status: Home / Self Care | Payer: Medicare Other | Source: Ambulatory Visit | Attending: Orthopedic Surgery | Admitting: Orthopedic Surgery

## 2018-08-27 DIAGNOSIS — Z1159 Encounter for screening for other viral diseases: Secondary | ICD-10-CM | POA: Diagnosis not present

## 2018-08-27 DIAGNOSIS — Z01812 Encounter for preprocedural laboratory examination: Secondary | ICD-10-CM | POA: Insufficient documentation

## 2018-08-27 LAB — SARS CORONAVIRUS 2 (TAT 6-24 HRS): SARS Coronavirus 2: NEGATIVE

## 2018-08-28 ENCOUNTER — Other Ambulatory Visit: Payer: Self-pay

## 2018-08-28 ENCOUNTER — Encounter (HOSPITAL_COMMUNITY): Payer: Self-pay | Admitting: *Deleted

## 2018-08-28 NOTE — Anesthesia Preprocedure Evaluation (Addendum)
Anesthesia Evaluation  Patient identified by MRN, date of birth, ID band Patient awake    Reviewed: Allergy & Precautions, NPO status , Patient's Chart, lab work & pertinent test results  History of Anesthesia Complications Negative for: history of anesthetic complications  Airway Mallampati: II  TM Distance: >3 FB Neck ROM: Full    Dental  (+) Dental Advisory Given, Teeth Intact   Pulmonary former smoker,    breath sounds clear to auscultation       Cardiovascular hypertension, Pt. on home beta blockers and Pt. on medications (-) angina Rhythm:Regular Rate:Bradycardia     Neuro/Psych PSYCHIATRIC DISORDERS Dementia negative neurological ROS     GI/Hepatic negative GI ROS, Neg liver ROS,   Endo/Other  negative endocrine ROS  Renal/GU negative Renal ROS     Musculoskeletal negative musculoskeletal ROS (+)   Abdominal   Peds  Hematology negative hematology ROS (+)   Anesthesia Other Findings   Reproductive/Obstetrics                            Anesthesia Physical Anesthesia Plan  ASA: III  Anesthesia Plan: General   Post-op Pain Management:    Induction: Intravenous  PONV Risk Score and Plan: 2 and Treatment may vary due to age or medical condition and Ondansetron  Airway Management Planned: LMA  Additional Equipment: None  Intra-op Plan:   Post-operative Plan: Extubation in OR  Informed Consent: I have reviewed the patients History and Physical, chart, labs and discussed the procedure including the risks, benefits and alternatives for the proposed anesthesia with the patient or authorized representative who has indicated his/her understanding and acceptance.     Dental advisory given  Plan Discussed with: CRNA and Anesthesiologist  Anesthesia Plan Comments:        Anesthesia Quick Evaluation

## 2018-08-28 NOTE — Progress Notes (Addendum)
Mr Klimaszewski asked me to speak to his wife, Manuela Schwartz.  I did ask patient if he has had any chest pain or shortness of breath. Mr Granderson had been in quarantine since COVID test and has no S/S of COVID. 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

## 2018-08-29 ENCOUNTER — Encounter (HOSPITAL_COMMUNITY): Payer: Self-pay

## 2018-08-29 ENCOUNTER — Other Ambulatory Visit: Payer: Self-pay

## 2018-08-29 ENCOUNTER — Ambulatory Visit (HOSPITAL_COMMUNITY): Payer: Medicare Other | Admitting: Anesthesiology

## 2018-08-29 ENCOUNTER — Ambulatory Visit (HOSPITAL_COMMUNITY)
Admission: RE | Admit: 2018-08-29 | Discharge: 2018-08-29 | Disposition: A | Discharge status: Home / Self Care | Payer: Medicare Other | Attending: Orthopedic Surgery | Admitting: Orthopedic Surgery

## 2018-08-29 ENCOUNTER — Encounter (HOSPITAL_COMMUNITY)
Admission: RE | Disposition: A | Discharge status: Home / Self Care | Payer: Self-pay | Source: Home / Self Care | Attending: Orthopedic Surgery

## 2018-08-29 DIAGNOSIS — F0390 Unspecified dementia without behavioral disturbance: Secondary | ICD-10-CM | POA: Insufficient documentation

## 2018-08-29 DIAGNOSIS — Z89422 Acquired absence of other left toe(s): Secondary | ICD-10-CM | POA: Diagnosis not present

## 2018-08-29 DIAGNOSIS — I10 Essential (primary) hypertension: Secondary | ICD-10-CM | POA: Insufficient documentation

## 2018-08-29 DIAGNOSIS — Z7982 Long term (current) use of aspirin: Secondary | ICD-10-CM | POA: Insufficient documentation

## 2018-08-29 DIAGNOSIS — Z79899 Other long term (current) drug therapy: Secondary | ICD-10-CM | POA: Diagnosis not present

## 2018-08-29 DIAGNOSIS — M869 Osteomyelitis, unspecified: Secondary | ICD-10-CM

## 2018-08-29 DIAGNOSIS — Z87891 Personal history of nicotine dependence: Secondary | ICD-10-CM | POA: Insufficient documentation

## 2018-08-29 HISTORY — DX: Personal history of urinary calculi: Z87.442

## 2018-08-29 HISTORY — PX: AMPUTATION: SHX166

## 2018-08-29 LAB — CBC
HCT: 44.2 % (ref 39.0–52.0)
Hemoglobin: 14.1 g/dL (ref 13.0–17.0)
MCH: 31.5 pg (ref 26.0–34.0)
MCHC: 31.9 g/dL (ref 30.0–36.0)
MCV: 98.9 fL (ref 80.0–100.0)
Platelets: DECREASED 10*3/uL (ref 150–400)
RBC: 4.47 MIL/uL (ref 4.22–5.81)
RDW: 13.2 % (ref 11.5–15.5)
WBC: 4.4 10*3/uL (ref 4.0–10.5)
nRBC: 0 % (ref 0.0–0.2)

## 2018-08-29 LAB — BASIC METABOLIC PANEL
Anion gap: 16 — ABNORMAL HIGH (ref 5–15)
BUN: 20 mg/dL (ref 8–23)
CO2: 17 mmol/L — ABNORMAL LOW (ref 22–32)
Calcium: 8.8 mg/dL — ABNORMAL LOW (ref 8.9–10.3)
Chloride: 106 mmol/L (ref 98–111)
Creatinine, Ser: 1.28 mg/dL — ABNORMAL HIGH (ref 0.61–1.24)
GFR calc Af Amer: >60 mL/min (ref >60)
GFR calc non Af Amer: 52 mL/min — ABNORMAL LOW (ref >60)
Glucose, Bld: 86 mg/dL (ref 70–99)
Potassium: 4.3 mmol/L (ref 3.5–5.1)
Sodium: 139 mmol/L (ref 135–145)

## 2018-08-29 SURGERY — AMPUTATION DIGIT
Anesthesia: General | Laterality: Left

## 2018-08-29 MED ORDER — ENSURE PRE-SURGERY PO LIQD
296.0000 mL | Freq: Once | ORAL | Status: DC
  Filled 2018-08-29: qty 296

## 2018-08-29 MED ORDER — CHLORHEXIDINE GLUCONATE 4 % EX LIQD: 60.0000 mL | Freq: Once | CUTANEOUS | Status: DC

## 2018-08-29 MED ORDER — 0.9 % SODIUM CHLORIDE (POUR BTL) OPTIME
TOPICAL | Status: DC | PRN
  Administered 2018-08-29: 1000 mL

## 2018-08-29 MED ORDER — OXYCODONE HCL 5 MG/5ML PO SOLN: 5.0000 mg | Freq: Once | ORAL | Status: DC | PRN

## 2018-08-29 MED ORDER — LACTATED RINGERS IV SOLN
INTRAVENOUS | Status: DC | PRN
  Administered 2018-08-29: 08:00:00 via INTRAVENOUS

## 2018-08-29 MED ORDER — LIDOCAINE HCL (CARDIAC) PF 100 MG/5ML IV SOSY
PREFILLED_SYRINGE | INTRAVENOUS | Status: DC | PRN
  Administered 2018-08-29: 60 mg via INTRATRACHEAL

## 2018-08-29 MED ORDER — ONDANSETRON HCL 4 MG/2ML IJ SOLN: 4.0000 mg | Freq: Once | INTRAMUSCULAR | Status: DC | PRN

## 2018-08-29 MED ORDER — ONDANSETRON HCL 4 MG/2ML IJ SOLN
INTRAMUSCULAR | Status: AC
  Filled 2018-08-29: qty 2

## 2018-08-29 MED ORDER — POVIDONE-IODINE 10 % EX SWAB: 2.0000 "application " | Freq: Once | CUTANEOUS | Status: DC

## 2018-08-29 MED ORDER — GLYCOPYRROLATE PF 0.2 MG/ML IJ SOSY
PREFILLED_SYRINGE | INTRAMUSCULAR | Status: AC
  Filled 2018-08-29: qty 1

## 2018-08-29 MED ORDER — EPHEDRINE SULFATE 50 MG/ML IJ SOLN
INTRAMUSCULAR | Status: DC | PRN
  Administered 2018-08-29: 5 mg via INTRAVENOUS

## 2018-08-29 MED ORDER — ONDANSETRON HCL 4 MG/2ML IJ SOLN
INTRAMUSCULAR | Status: DC | PRN
  Administered 2018-08-29: 4 mg via INTRAVENOUS

## 2018-08-29 MED ORDER — OXYCODONE HCL 5 MG PO TABS: 5.0000 mg | ORAL_TABLET | Freq: Once | ORAL | Status: DC | PRN

## 2018-08-29 MED ORDER — FENTANYL CITRATE (PF) 100 MCG/2ML IJ SOLN
INTRAMUSCULAR | Status: AC
  Filled 2018-08-29: qty 2

## 2018-08-29 MED ORDER — GLYCOPYRROLATE 0.2 MG/ML IJ SOLN
INTRAMUSCULAR | Status: DC | PRN
  Administered 2018-08-29: 0.2 mg via INTRAVENOUS

## 2018-08-29 MED ORDER — PROPOFOL 10 MG/ML IV BOLUS
INTRAVENOUS | Status: DC | PRN
  Administered 2018-08-29: 200 mg via INTRAVENOUS

## 2018-08-29 MED ORDER — LIDOCAINE 2% (20 MG/ML) 5 ML SYRINGE
INTRAMUSCULAR | Status: AC
  Filled 2018-08-29: qty 5

## 2018-08-29 MED ORDER — CEFAZOLIN SODIUM-DEXTROSE 2-4 GM/100ML-% IV SOLN
2.0000 g | INTRAVENOUS | Status: AC
  Administered 2018-08-29: 2 g via INTRAVENOUS

## 2018-08-29 MED ORDER — EPHEDRINE 5 MG/ML INJ
INTRAVENOUS | Status: AC
  Filled 2018-08-29: qty 10

## 2018-08-29 MED ORDER — CEFAZOLIN SODIUM 1 G IJ SOLR
INTRAMUSCULAR | Status: AC
  Filled 2018-08-29: qty 20

## 2018-08-29 MED ORDER — FENTANYL CITRATE (PF) 100 MCG/2ML IJ SOLN: 25.0000 ug | INTRAMUSCULAR | Status: DC | PRN

## 2018-08-29 SURGICAL SUPPLY — 31 items
BLADE SURG 21 STRL SS (BLADE) ×3 IMPLANT
BNDG COHESIVE 4X5 TAN STRL (GAUZE/BANDAGES/DRESSINGS) ×3 IMPLANT
BNDG COHESIVE 6X5 TAN STRL LF (GAUZE/BANDAGES/DRESSINGS) ×3 IMPLANT
BNDG ESMARK 4X9 LF (GAUZE/BANDAGES/DRESSINGS) IMPLANT
BNDG GAUZE ELAST 4 BULKY (GAUZE/BANDAGES/DRESSINGS) ×3 IMPLANT
COVER SURGICAL LIGHT HANDLE (MISCELLANEOUS) ×6 IMPLANT
COVER WAND RF STERILE (DRAPES) IMPLANT
DRAPE U-SHAPE 47X51 STRL (DRAPES) ×3 IMPLANT
DRSG ADAPTIC 3X8 NADH LF (GAUZE/BANDAGES/DRESSINGS) ×3 IMPLANT
DRSG PAD ABDOMINAL 8X10 ST (GAUZE/BANDAGES/DRESSINGS) ×3 IMPLANT
DURAPREP 26ML APPLICATOR (WOUND CARE) ×3 IMPLANT
ELECT REM PT RETURN 9FT ADLT (ELECTROSURGICAL) ×3
ELECTRODE REM PT RTRN 9FT ADLT (ELECTROSURGICAL) ×1 IMPLANT
GAUZE SPONGE 4X4 12PLY STRL (GAUZE/BANDAGES/DRESSINGS) ×3 IMPLANT
GAUZE SPONGE 4X4 12PLY STRL LF (GAUZE/BANDAGES/DRESSINGS) ×3 IMPLANT
GLOVE BIOGEL PI IND STRL 9 (GLOVE) ×1 IMPLANT
GLOVE BIOGEL PI INDICATOR 9 (GLOVE) ×2
GLOVE SURG ORTHO 9.0 STRL STRW (GLOVE) ×3 IMPLANT
GOWN STRL REUS W/ TWL XL LVL3 (GOWN DISPOSABLE) ×2 IMPLANT
GOWN STRL REUS W/TWL XL LVL3 (GOWN DISPOSABLE) ×4
KIT BASIN OR (CUSTOM PROCEDURE TRAY) ×3 IMPLANT
KIT TURNOVER KIT B (KITS) ×3 IMPLANT
MANIFOLD NEPTUNE II (INSTRUMENTS) ×3 IMPLANT
NEEDLE 22X1 1/2 (OR ONLY) (NEEDLE) IMPLANT
NS IRRIG 1000ML POUR BTL (IV SOLUTION) ×3 IMPLANT
PACK ORTHO EXTREMITY (CUSTOM PROCEDURE TRAY) ×3 IMPLANT
PAD ABD 8X10 STRL (GAUZE/BANDAGES/DRESSINGS) ×3 IMPLANT
PAD ARMBOARD 7.5X6 YLW CONV (MISCELLANEOUS) ×6 IMPLANT
SUT ETHILON 2 0 PSLX (SUTURE) ×3 IMPLANT
SYR CONTROL 10ML LL (SYRINGE) IMPLANT
TOWEL GREEN STERILE (TOWEL DISPOSABLE) ×3 IMPLANT

## 2018-08-29 NOTE — Op Note (Signed)
08/29/2018  9:04 AM  PATIENT:  Joel Ibarra.    PRE-OPERATIVE DIAGNOSIS:  Osteomyelitis Left 3rd Toe  POST-OPERATIVE DIAGNOSIS:  Same  PROCEDURE:  LEFT 3RD RAY AMPUTATION  SURGEON:  Newt Minion, MD  PHYSICIAN ASSISTANT:None ANESTHESIA:   General  PREOPERATIVE INDICATIONS:  Joel Ibarra. is a  84 y.o. male with a diagnosis of Osteomyelitis Left 3rd Toe who failed conservative measures and elected for surgical management.    The risks benefits and alternatives were discussed with the patient preoperatively including but not limited to the risks of infection, bleeding, nerve injury, cardiopulmonary complications, the need for revision surgery, among others, and the patient was willing to proceed.  OPERATIVE IMPLANTS: none  @ENCIMAGES @  OPERATIVE FINDINGS: No abscess or infection at the amputation site.  Good petechial bleeding.  OPERATIVE PROCEDURE: Patient was brought the operating room and underwent a general anesthetic.  After adequate levels anesthesia were obtained patient's left lower extremity was prepped using DuraPrep draped into a sterile field a timeout was called.  A fishmouth incision was made just distal to the MTP joint.  The third toe of left foot was amputated through the MTP joint.  Patient had a very prominent third metatarsal head which was providing a pressure-point plantarly.  The metatarsal was then resected through the metatarsal neck.  Electrocautery was used for hemostasis the wound was irrigated with normal saline the incision was closed using 2-0 nylon.  Sterile dressing was applied patient was extubated taken the PACU in stable condition   DISCHARGE PLANNING:  Antibiotic duration: Preoperative antibiotics  Weightbearing: Touchdown weightbearing on the left  Pain medication: Tylenol for pain  Dressing care/ Wound VAC: Follow-up in the office to change the dressing  Ambulatory devices: Walker  Discharge to:  Home  Follow-up: In the office 1 week post operative.

## 2018-08-29 NOTE — Progress Notes (Signed)
Orthopedic Tech Progress Note Patient Details:  Joel Ibarra. Oct 11, 1934 [id number] PACU RN called requesting a post op shoe for patient. Ortho Devices Type of Ortho Device: Postop shoe/boot Ortho Device/Splint Location: LLE Ortho Device/Splint Interventions: Adjustment, Application, Ordered   Post Interventions Patient Tolerated: Well Instructions Provided: Adjustment of device   [name] 08/29/2018, 9:58 AM

## 2018-08-29 NOTE — Progress Notes (Signed)
Pt has difficulty answering some questions.   Wife, Manuela Schwartz, is available if needed.  [phone]

## 2018-08-29 NOTE — H&P (Signed)
Joel Pop. is an 84 y.o. male.   Chief Complaint: Ulceration swelling osteomyelitis third toe of left foot HPI: Patient is an 84 year old gentleman who presents with his daughter with ulceration redness and swelling left foot third toe is status post great toe and second toe amputation.  Patient states he has had some drainage from the toe as well.  Patient is status post great toe and second toe amputation for osteomyelitis.  Past Medical History:  Diagnosis Date  . Cancer ([place])   . Dementia ([place])   . History of kidney stones     numerous- Lithrotrispy  . Hypertension   . Kidney stones     Past Surgical History:  Procedure Laterality Date  . AMPUTATION Left 01/31/2018   Procedure: LEFT FOOT SECOND TOE AMPUTATION;  Surgeon: Newt Minion, MD;  Location: [facility];  Service: Orthopedics;  Laterality: Left;  . CHOLECYSTECTOMY    . EYE SURGERY Bilateral    catract  . NISSEN FUNDOPLICATION      Family History  Family history unknown: Yes   Social History:  reports that he quit smoking about 50 years ago. He quit after 5.00 years of use. He has never used smokeless tobacco. He reports current alcohol use of about 7.0 standard drinks of alcohol per week. He reports that he does not use drugs.  Allergies:  Allergies  Allergen Reactions  . Bee Venom Swelling  . Codeine Nausea And Vomiting, Nausea Only, Swelling and Other (See Comments)  . Daptomycin Other (See Comments)    Pneumonitis  . Clindamycin/Lincomycin Rash  . Lincomycin Rash  . Lactose Diarrhea and Other (See Comments)    Cramps      Medications Prior to Admission  Medication Sig Dispense Refill  . aspirin 325 MG EC tablet Take 325 mg by mouth every evening.    . Cholecalciferol 125 MCG (5000 UT) capsule Take 5,000 Units by mouth every Tuesday.     . memantine (NAMENDA) 5 MG tablet Take 5 mg by mouth daily.    . metoprolol succinate (TOPROL-XL) 100 MG 24 hr tablet Take 100 mg by mouth daily.    .  sertraline (ZOLOFT) 100 MG tablet Take 100 mg by mouth daily.      Results for orders placed or performed during the hospital encounter of 08/27/18 (from the past 48 hour(s))  SARS Coronavirus 2 (Performed in [facility] hospital lab)     Status: None   Collection Time: 08/27/18  4:10 PM  Result Value Ref Range   SARS Coronavirus 2 NEGATIVE NEGATIVE    Comment: (NOTE) SARS-CoV-2 target nucleic acids are NOT DETECTED. The SARS-CoV-2 RNA is generally detectable in upper and lower respiratory specimens during the acute phase of infection. Negative results do not preclude SARS-CoV-2 infection, do not rule out co-infections with other pathogens, and should not be used as the sole basis for treatment or other patient management decisions. Negative results must be combined with clinical observations, patient history, and epidemiological information. The expected result is Negative. Fact Sheet for Patients: SugarRoll.be Fact Sheet for Healthcare Providers: https://www.woods-mathews.com/ This test is not yet approved or cleared by the [place] FDA and  has been authorized for detection and/or diagnosis of SARS-CoV-2 by FDA under an Emergency Use Authorization (EUA). This EUA will remain  in effect (meaning this test can be used) for the duration of the COVID-19 declaration under Section 56 4(b)(1) of the Act, 21 U.S.C. section 360bbb-3(b)(1), unless the authorization is terminated or revoked  sooner. Performed at [facility], [place] [address] [place], [place] [address]    No results found.  Review of Systems  All other systems reviewed and are negative.   There were no vitals taken for this visit. Physical Exam  Patient is alert, oriented, no adenopathy, well-dressed, normal affect, normal respiratory effort. Examination patient has a strong dorsalis pedis and posterior tibial pulse the great toe and second toe amputation sites have  healed nicely.  He has good dorsiflexion of the ankle just past neutral.  There is red sausage digit swelling of the third toe with an ulcer that probes to bone of the tuft of the third toe.  Assessment/Plan  1. Osteomyelitis of third toe of left foot ([place])     Plan: Discussed recommendation to proceed with amputation of third toe due to osteomyelitis and cellulitis.  Patient and his daughter state they understand and wish to proceed with surgery at this time.  We will plan for surgery next Wednesday outpatient surgery.  Risk and benefits were discussed including nonhealing and need for additional surgery.   Newt Minion, MD 08/29/2018, 6:47 AM

## 2018-08-29 NOTE — Anesthesia Procedure Notes (Signed)
Procedure Name: LMA Insertion Date/Time: 08/29/2018 8:44 AM Performed by: Kathryne Hitch, CRNA Pre-anesthesia Checklist: Patient identified, Emergency Drugs available, Suction available and Patient being monitored Patient Re-evaluated:Patient Re-evaluated prior to induction Oxygen Delivery Method: Circle system utilized Preoxygenation: Pre-oxygenation with 100% oxygen Induction Type: IV induction LMA: LMA inserted LMA Size: 5.0 Number of attempts: 2 Placement Confirmation: breath sounds checked- equal and bilateral Tube secured with: Tape Dental Injury: Teeth and Oropharynx as per pre-operative assessment

## 2018-08-29 NOTE — Transfer of Care (Signed)
Immediate Anesthesia Transfer of Care Note  Patient: Joel Ibarra.  Procedure(s) Performed: LEFT 3RD TOE AMPUTATION (Left )  Patient Location: PACU  Anesthesia Type:General  Level of Consciousness: drowsy and patient cooperative  Airway & Oxygen Therapy: Patient Spontanous Breathing and Patient connected to face mask oxygen  Post-op Assessment: Report given to RN and Post -op Vital signs reviewed and stable  Post vital signs: Reviewed and stable  Last Vitals:  Vitals Value Taken Time  BP    Temp    Pulse 62 08/29/18 0902  Resp    SpO2 97 % 08/29/18 0902  Vitals shown include unvalidated device data.  Last Pain:  Vitals:   08/29/18 0734  TempSrc:   PainSc: 0-No pain         Complications: No apparent anesthesia complications

## 2018-08-29 NOTE — Anesthesia Postprocedure Evaluation (Signed)
Anesthesia Post Note  Patient: Joel Ibarra.  Procedure(s) Performed: LEFT 3RD TOE AMPUTATION (Left )     Patient location during evaluation: PACU Anesthesia Type: General Level of consciousness: awake and alert Pain management: pain level controlled Vital Signs Assessment: post-procedure vital signs reviewed and stable Respiratory status: spontaneous breathing, nonlabored ventilation and respiratory function stable Cardiovascular status: blood pressure returned to baseline and stable Postop Assessment: no apparent nausea or vomiting Anesthetic complications: no    Last Vitals:  Vitals:   08/29/18 1005 08/29/18 1015  BP:  123/68  Pulse: (!) 58   Resp: 17   Temp: (!) 36.2 C (!) 36.4 C  SpO2: 95% 97%    Last Pain:  Vitals:   08/29/18 1005  TempSrc:   PainSc: 0-No pain                 Audry Pili

## 2018-08-30 ENCOUNTER — Encounter (HOSPITAL_COMMUNITY): Payer: Self-pay | Admitting: Orthopedic Surgery

## 2018-09-05 ENCOUNTER — Other Ambulatory Visit: Payer: Self-pay

## 2018-09-05 ENCOUNTER — Ambulatory Visit (INDEPENDENT_AMBULATORY_CARE_PROVIDER_SITE_OTHER): Payer: Medicare Other | Admitting: Family

## 2018-09-05 ENCOUNTER — Encounter: Payer: Self-pay | Admitting: Family

## 2018-09-05 VITALS — Ht 71.0 in | Wt 165.0 lb

## 2018-09-05 DIAGNOSIS — Z89422 Acquired absence of other left toe(s): Secondary | ICD-10-CM

## 2018-09-05 NOTE — Progress Notes (Signed)
   Post-Op Visit Note   Patient: Joel Ibarra.           Date of Birth: 01-Sep-1934           MRN: [id number] Visit Date: 09/05/2018 PCP: Jilda Panda, MD  Chief Complaint:  Chief Complaint  Patient presents with  . Left Foot - Routine Post Op    08/29/18 Left foot 3rd toe amp     HPI:  HPI The patient is an 84 year old gentleman seen today status post left third toe amputation 1 week ago.  He is doing well full weightbearing.  His son-in-law accompanies the visit. Ortho Exam On examination the incision is well approximated with sutures.  There is some maceration along the incision there is no drainage no erythema no swelling to his lower extremity no sign of infection.  Visit Diagnoses: No diagnosis found.  Plan: Discussed the importance of offloading the foot minimizing weightbearing on the left.  They will begin daily Dial soap cleansing and dry dressing changes.  He will follow-up in 2 weeks for suture removal.  Follow-Up Instructions: No follow-ups on file.   Imaging: No results found.  Orders:  No orders of the defined types were placed in this encounter.  No orders of the defined types were placed in this encounter.    PMFS History: Patient Active Problem List   Diagnosis Date Noted  . Osteomyelitis of third toe of left foot ([place]) 08/23/2018  . Hyperglycemia 01/31/2018  . Acute kidney injury superimposed on chronic kidney disease ([place]) 01/31/2018  . Thrombocytopenia ([place]) 01/31/2018  . Osteomyelitis of second toe of left foot ([place])   . Abscess, toe, left   . Cellulitis of left foot 01/29/2018  . Essential hypertension 01/29/2018  . Dementia ([place]) 01/29/2018  . Cellulitis 01/29/2018  . Diarrhea 06/15/2017  . Nausea & vomiting 06/15/2017  . Tachycardia 06/15/2017   Past Medical History:  Diagnosis Date  . Cancer ([place])   . Dementia ([place])   . History of kidney stones     numerous- Lithrotrispy  . Hypertension   . Kidney stones     Family  History  Family history unknown: Yes    Past Surgical History:  Procedure Laterality Date  . AMPUTATION Left 01/31/2018   Procedure: LEFT FOOT SECOND TOE AMPUTATION;  Surgeon: Newt Minion, MD;  Location: [facility];  Service: Orthopedics;  Laterality: Left;  . AMPUTATION Left 08/29/2018   Procedure: LEFT 3RD TOE AMPUTATION;  Surgeon: Newt Minion, MD;  Location: [facility];  Service: Orthopedics;  Laterality: Left;  . CHOLECYSTECTOMY    . EYE SURGERY Bilateral    catract  . NISSEN FUNDOPLICATION     Social History   Occupational History  . Not on file  Tobacco Use  . Smoking status: Former Smoker    Years: 5.00    Quit date: 1970    Years since quitting: 50.5  . Smokeless tobacco: Never Used  Substance and Sexual Activity  . Alcohol use: Yes    Alcohol/week: 7.0 standard drinks    Types: 7 Standard drinks or equivalent per week  . Drug use: Never  . Sexual activity: Not on file

## 2018-09-18 ENCOUNTER — Ambulatory Visit (INDEPENDENT_AMBULATORY_CARE_PROVIDER_SITE_OTHER): Payer: Medicare Other | Admitting: Family

## 2018-09-18 ENCOUNTER — Encounter: Payer: Self-pay | Admitting: Family

## 2018-09-18 VITALS — Ht 71.0 in | Wt 165.0 lb

## 2018-09-18 DIAGNOSIS — Z89422 Acquired absence of other left toe(s): Secondary | ICD-10-CM

## 2018-09-18 DIAGNOSIS — M869 Osteomyelitis, unspecified: Secondary | ICD-10-CM

## 2018-09-18 NOTE — Progress Notes (Signed)
   Post-Op Visit Note   Patient: Joel Ibarra.           Date of Birth: 1934/07/08           MRN: [id number] Visit Date: 09/18/2018 PCP: Jilda Panda, MD  Chief Complaint:  Chief Complaint  Patient presents with  . Left Foot - Routine Post Op    08/29/18 left foot 3rd toe amputation     HPI:  HPI The patient is an 84 year old gentleman seen today status post left 3rd toe amputation, is s/p left gt and 2nd toe amputations. Son accompanies the visit.   Ortho Exam Incision is well healed. No drainage. No erythema. No sign of infection.  Visit Diagnoses:  1. Osteomyelitis of third toe of left foot ([place])   2. Acquired absence of other left toe(s) ([place])     Plan: sutures harvested. Order for custom orthotics and spacer provided. To resume regular shoe wear and advance weight bearing as tolerated.  Follow-Up Instructions: No follow-ups on file.   Imaging: No results found.  Orders:  No orders of the defined types were placed in this encounter.  No orders of the defined types were placed in this encounter.    PMFS History: Patient Active Problem List   Diagnosis Date Noted  . Osteomyelitis of third toe of left foot ([place]) 08/23/2018  . Hyperglycemia 01/31/2018  . Acute kidney injury superimposed on chronic kidney disease ([place]) 01/31/2018  . Thrombocytopenia ([place]) 01/31/2018  . Osteomyelitis of second toe of left foot ([place])   . Abscess, toe, left   . Cellulitis of left foot 01/29/2018  . Essential hypertension 01/29/2018  . Dementia ([place]) 01/29/2018  . Cellulitis 01/29/2018  . Diarrhea 06/15/2017  . Nausea & vomiting 06/15/2017  . Tachycardia 06/15/2017   Past Medical History:  Diagnosis Date  . Cancer ([place])   . Dementia ([place])   . History of kidney stones     numerous- Lithrotrispy  . Hypertension   . Kidney stones     Family History  Family history unknown: Yes    Past Surgical History:  Procedure Laterality Date  . AMPUTATION Left 01/31/2018    Procedure: LEFT FOOT SECOND TOE AMPUTATION;  Surgeon: Newt Minion, MD;  Location: [facility];  Service: Orthopedics;  Laterality: Left;  . AMPUTATION Left 08/29/2018   Procedure: LEFT 3RD TOE AMPUTATION;  Surgeon: Newt Minion, MD;  Location: [facility];  Service: Orthopedics;  Laterality: Left;  . CHOLECYSTECTOMY    . EYE SURGERY Bilateral    catract  . NISSEN FUNDOPLICATION     Social History   Occupational History  . Not on file  Tobacco Use  . Smoking status: Former Smoker    Years: 5.00    Quit date: 1970    Years since quitting: 50.5  . Smokeless tobacco: Never Used  Substance and Sexual Activity  . Alcohol use: Yes    Alcohol/week: 7.0 standard drinks    Types: 7 Standard drinks or equivalent per week  . Drug use: Never  . Sexual activity: Not on file

## 2019-02-06 ENCOUNTER — Ambulatory Visit (INDEPENDENT_AMBULATORY_CARE_PROVIDER_SITE_OTHER): Payer: Medicare Other | Admitting: Diagnostic Neuroimaging

## 2019-02-06 ENCOUNTER — Encounter: Payer: Self-pay | Admitting: Diagnostic Neuroimaging

## 2019-02-06 ENCOUNTER — Other Ambulatory Visit: Payer: Self-pay

## 2019-02-06 ENCOUNTER — Encounter

## 2019-02-06 VITALS — BP 135/60 | HR 78 | Temp 97.3°F | Ht 71.0 in | Wt 172.0 lb

## 2019-02-06 DIAGNOSIS — F03B0 Unspecified dementia, moderate, without behavioral disturbance, psychotic disturbance, mood disturbance, and anxiety: Secondary | ICD-10-CM

## 2019-02-06 DIAGNOSIS — R413 Other amnesia: Secondary | ICD-10-CM | POA: Diagnosis not present

## 2019-02-06 DIAGNOSIS — F0390 Unspecified dementia without behavioral disturbance: Secondary | ICD-10-CM

## 2019-02-06 NOTE — Progress Notes (Signed)
[facility]  PATIENT: Joel Ibarra. DOB: [birth year]  REFERRING CLINICIAN: R Moreira HISTORY FROM: patient and son in law REASON FOR VISIT: new consult    HISTORICAL  CHIEF COMPLAINT:  Chief Complaint  Patient presents with  . Referral    Referral for memory loss room 6 pt with bARRY HIS SON IN LAW     HISTORY OF PRESENT ILLNESS:   84 year old male here for evaluation of dementia.  For past 2 to 3 years patient had gradual onset progressive short-term memory loss and confusion.  He is decline in his function abilities.  He is no longer driving.  He has some difficulty using household appliances and his cell phone.  His wife manages his medications and finances.  In the last 6 to 12 months is language ability has significantly declined and he has increased difficulty with expressing himself.  No significant agitation or hallucinations.  No significant depression or anxiety.  He has had some moments of paranoia where he thought other people were taking things from him.  Patient previously evaluated by memory and geriatric clinic in [place].  Now patient and family are trying to consolidate his care closer to [place].  I reviewed prior office notes and patient previously diagnosed with mild cognitive impairment 1 year ago.   REVIEW OF SYSTEMS: Full 14 system review of systems performed and negative with exception of: As per HPI.  ALLERGIES: Allergies  Allergen Reactions  . Bee Venom Swelling  . Codeine Nausea And Vomiting, Nausea Only, Swelling and Other (See Comments)  . Daptomycin Other (See Comments)    Pneumonitis  . Clindamycin/Lincomycin Rash  . Lincomycin Rash  . Lactose Diarrhea and Other (See Comments)    Cramps      HOME MEDICATIONS: Outpatient Medications Prior to Visit  Medication Sig Dispense Refill  . aspirin 325 MG EC tablet Take 325 mg by mouth every evening.    . Aspirin Buf,CaCarb-MgCarb-MgO, 81 MG TABS Take by  mouth.    . Cholecalciferol 125 MCG (5000 UT) capsule Take 5,000 Units by mouth every Tuesday.     . memantine (NAMENDA) 5 MG tablet Take 5 mg by mouth daily.    . metoprolol succinate (TOPROL-XL) 100 MG 24 hr tablet Take 100 mg by mouth daily.    . Multiple Vitamin (MULTI-VITAMIN) tablet Take by mouth.    . sertraline (ZOLOFT) 100 MG tablet Take 100 mg by mouth daily.    . sertraline (ZOLOFT) 100 MG tablet TAKE 1 TABLET EVERY DAY     No facility-administered medications prior to visit.     PAST MEDICAL HISTORY: Past Medical History:  Diagnosis Date  . Cancer ([place])   . Dementia ([place])   . History of kidney stones     numerous- Lithrotrispy  . Hypertension   . Hypothyroidism   . Kidney stones   . Memory loss     PAST SURGICAL HISTORY: Past Surgical History:  Procedure Laterality Date  . AMPUTATION Left 01/31/2018   Procedure: LEFT FOOT SECOND TOE AMPUTATION;  Surgeon: Newt Minion, MD;  Location: [facility];  Service: Orthopedics;  Laterality: Left;  . AMPUTATION Left 08/29/2018   Procedure: LEFT 3RD TOE AMPUTATION;  Surgeon: Newt Minion, MD;  Location: [facility];  Service: Orthopedics;  Laterality: Left;  . CHOLECYSTECTOMY    . EYE SURGERY Bilateral    catract  . left first toe AMPUTATION    . NISSEN FUNDOPLICATION      FAMILY HISTORY: Family  History  Problem Relation Age of Onset  . Memory loss Mother   . Memory loss Father     SOCIAL HISTORY: Social History   Socioeconomic History  . Marital status: Married    Spouse name: Not on file  . Number of children: Not on file  . Years of education: Not on file  . Highest education level: Not on file  Occupational History  . Not on file  Social Needs  . Financial resource strain: Not on file  . Food insecurity    Worry: Not on file    Inability: Not on file  . Transportation needs    Medical: Not on file    Non-medical: Not on file  Tobacco Use  . Smoking status: Former Smoker    Years: 5.00    Quit date: 1970     Years since quitting: 50.9  . Smokeless tobacco: Never Used  Substance and Sexual Activity  . Alcohol use: Yes    Alcohol/week: 7.0 standard drinks    Types: 7 Standard drinks or equivalent per week  . Drug use: Never  . Sexual activity: Not on file  Lifestyle  . Physical activity    Days per week: Not on file    Minutes per session: Not on file  . Stress: Not on file  Relationships  . Social Herbalist on phone: Not on file    Gets together: Not on file    Attends religious service: Not on file    Active member of club or organization: Not on file    Attends meetings of clubs or organizations: Not on file    Relationship status: Not on file  . Intimate partner violence    Fear of current or ex partner: Not on file    Emotionally abused: Not on file    Physically abused: Not on file    Forced sexual activity: Not on file  Other Topics Concern  . Not on file  Social History Narrative  . Not on file     PHYSICAL EXAM  GENERAL EXAM/CONSTITUTIONAL: Vitals:  Vitals:   02/06/19 0957  BP: 135/60  Pulse: 78  Temp: (!) 97.3 F (36.3 C)  Weight: 172 lb (78 kg)  Height: 5\' 11"  (1.803 m)     Body mass index is 23.99 kg/m. Wt Readings from Last 3 Encounters:  02/06/19 172 lb (78 kg)  09/18/18 165 lb (74.8 kg)  09/05/18 165 lb (74.8 kg)     Patient is in no distress; well developed, nourished and groomed; neck is supple  CARDIOVASCULAR:  Examination of carotid arteries is normal; no carotid bruits  Regular rate and rhythm, no murmurs  Examination of peripheral vascular system by observation and palpation is normal  EYES:  Ophthalmoscopic exam of optic discs and posterior segments is normal; no papilledema or hemorrhages  No exam data present  MUSCULOSKELETAL:  Gait, strength, tone, movements noted in Neurologic exam below  NEUROLOGIC: MENTAL STATUS:  MMSE - [place] Exam 02/06/2019  Orientation to time 2  Orientation to Place 0   Registration 3  Attention/ Calculation 0  Recall 0  Language- name 2 objects 1  Language- repeat 1  Language- follow 3 step command 3  Language- read & follow direction 1  Write a sentence 0  Copy design 1  Total score 12    awake, alert, oriented to person  decr memory   decr attention and concentration  decr fluency; comprehension intact, naming  intact  fund of knowledge appropriate  CRANIAL NERVE:   2nd - no papilledema on fundoscopic exam  2nd, 3rd, 4th, 6th - pupils equal and reactive to light, visual fields full to confrontation, extraocular muscles intact, no nystagmus  5th - facial sensation symmetric  7th - facial strength symmetric  8th - hearing intact  9th - palate elevates symmetrically, uvula midline  11th - shoulder shrug symmetric  12th - tongue protrusion midline  MOTOR:   normal bulk; full strength in the BUE, BLE  INCREASED TONE IN LUE > RUE  SENSORY:   normal and symmetric to light touch  COORDINATION:   finger-nose-finger, fine finger movements SLOW   REFLEXES:   deep tendon reflexes TRACE and symmetric  GAIT/STATION:   USES WALKER; UNSTEADY     DIAGNOSTIC DATA (LABS, IMAGING, TESTING) - I reviewed patient records, labs, notes, testing and imaging myself where available.  Lab Results  Component Value Date   WBC 4.4 08/29/2018   HGB 14.1 08/29/2018   HCT 44.2 08/29/2018   MCV 98.9 08/29/2018   PLT  08/29/2018    PLATELET CLUMPS NOTED ON SMEAR, COUNT APPEARS DECREASED      Component Value Date/Time   NA 139 08/29/2018 0713   K 4.3 08/29/2018 0713   CL 106 08/29/2018 0713   CO2 17 (L) 08/29/2018 0713   GLUCOSE 86 08/29/2018 0713   BUN 20 08/29/2018 0713   CREATININE 1.28 (H) 08/29/2018 0713   CALCIUM 8.8 (L) 08/29/2018 0713   PROT 6.6 02/01/2018 0151   ALBUMIN 3.0 (L) 02/01/2018 0151   AST 20 02/01/2018 0151   ALT 15 02/01/2018 0151   ALKPHOS 61 02/01/2018 0151   BILITOT 0.7 02/01/2018 0151   GFRNONAA 52  (L) 08/29/2018 0713   GFRAA >60 08/29/2018 0713   No results found for: CHOL, HDL, LDLCALC, LDLDIRECT, TRIG, CHOLHDL Lab Results  Component Value Date   HGBA1C 5.5 06/15/2017   No results found for: [id number] Lab Results  Component Value Date   TSH 2.198 06/15/2017    11/03/16 MRI brain  1. No acute intracranial abnormality. 2. Mild generalized atrophy and white matter disease. 3. 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   ASSESSMENT AND PLAN  84 y.o. year old male here with progressive memory and cognitive decline, language decline, since 2017 with more significant decline in 2020.  MMSE 12 out of 30.  Dx:  1. Moderate dementia without behavioral disturbance ([place])   2. Memory loss     PLAN:  MODERATE DEMENTIA (without behavioral changes) - check MRI brain (due to rapid progression of symptoms) - continue memantine (may gradually increase to 10mg  twice a day) - safety / supervision issues reviewed; needs 24 hour supervision - caregiver resources provided - no driving  Return for return to PCP, pending if symptoms worsen or fail to improve.    Penni Bombard, MD [id number], [id number] AM Certified in Neurology, Neurophysiology and Neuroimaging  [facility] [address], [place], [place] [address] [phone]

## 2019-02-06 NOTE — Patient Instructions (Signed)
MODERATE DEMENTIA (without behavioral changes) - check MRI brain  - continue memantine (may gradually increase to 10mg  twice a day) - safety / supervision issues reviewed; needs 24 hour supervision - caregiver resources provided - no driving

## 2019-03-14 ENCOUNTER — Other Ambulatory Visit: Payer: Medicare Other

## 2019-03-19 ENCOUNTER — Other Ambulatory Visit: Payer: Medicare Other

## 2019-04-30 ENCOUNTER — Other Ambulatory Visit: Payer: Self-pay

## 2019-04-30 ENCOUNTER — Ambulatory Visit
Admission: RE | Admit: 2019-04-30 | Discharge: 2019-04-30 | Disposition: A | Discharge status: Home / Self Care | Payer: Medicare Other | Source: Ambulatory Visit | Attending: Diagnostic Neuroimaging | Admitting: Diagnostic Neuroimaging

## 2019-04-30 DIAGNOSIS — R413 Other amnesia: Secondary | ICD-10-CM

## 2019-04-30 MED ORDER — GADOBUTROL 1 MMOL/ML IV SOLN
10.0000 mL | Freq: Once | INTRAVENOUS | Status: AC | PRN
  Administered 2019-04-30: 10 mL via INTRAVENOUS

## 2019-05-02 ENCOUNTER — Telehealth: Payer: Self-pay | Admitting: *Deleted

## 2019-05-02 NOTE — Telephone Encounter (Signed)
Called daughter, [name] and informed patient's MRI Brain showed atrophy; may be related to memory loss. Advised they continue current plan. She stated that long term insurance isn't paying for his care at [facility] where he and his wife are in independent living. He has care through an agency.  I advised he may need to move into memory care at [facility]; she stated her mom won't want to be separated. I advised she talk to case manager at [employer]. [name]  verbalized understanding, appreciation.

## 2019-05-13 IMAGING — CR DG FOOT COMPLETE 3+V*L*
3 series · 3 of 3 positions shown · non-contrast
Comparison: 08/09/2016

CLINICAL DATA: Cellulitis left foot

EXAM:
LEFT FOOT - COMPLETE 3+ VIEW

[x foot ap left]
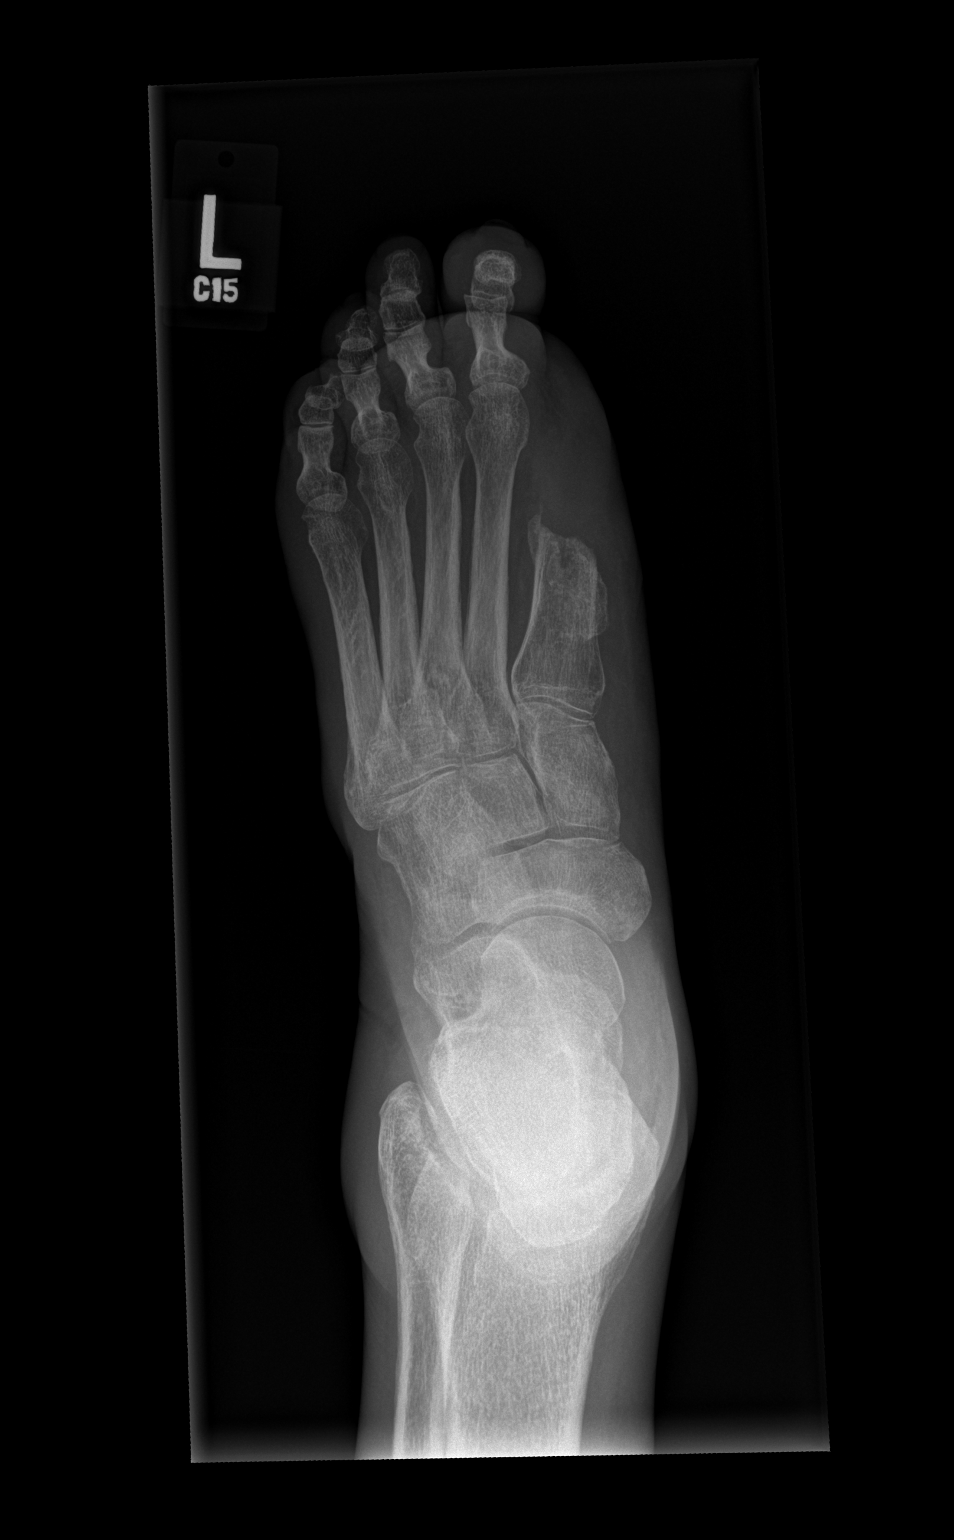

[x foot obl left]
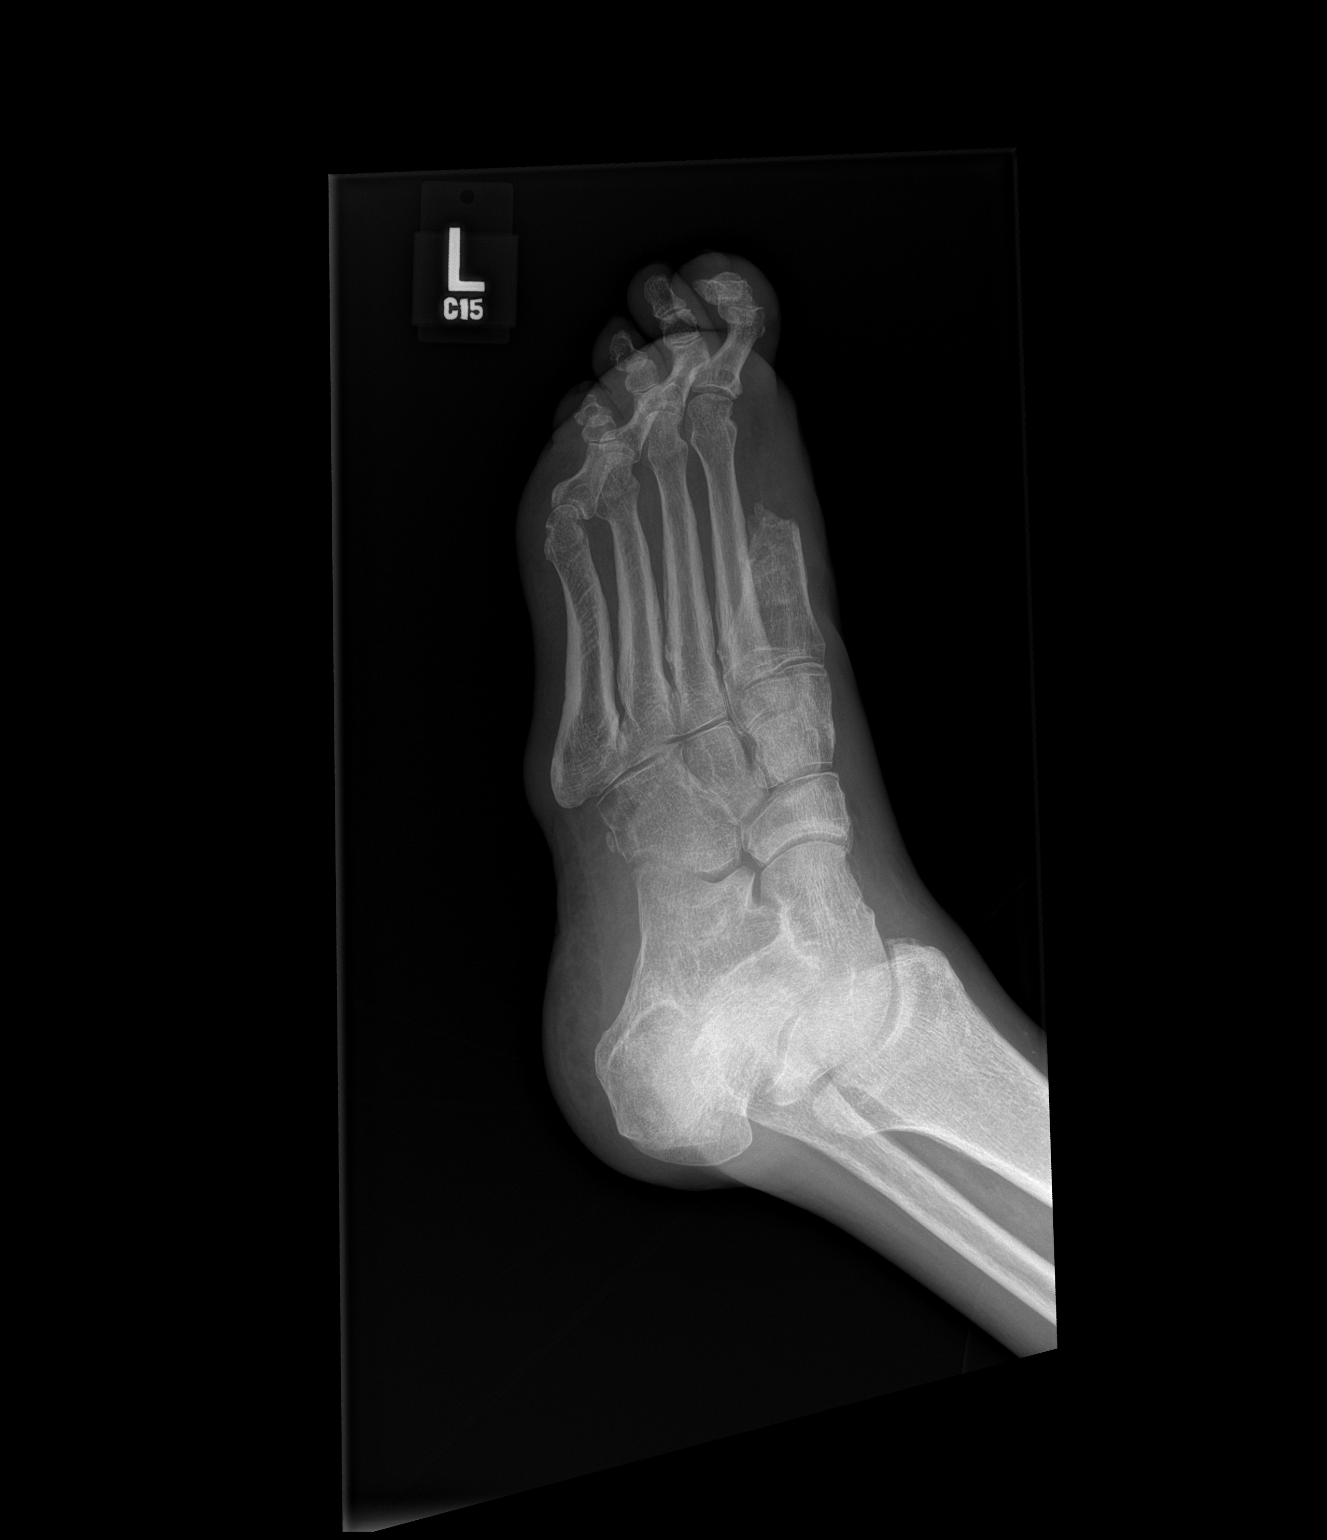

[x foot lat left]
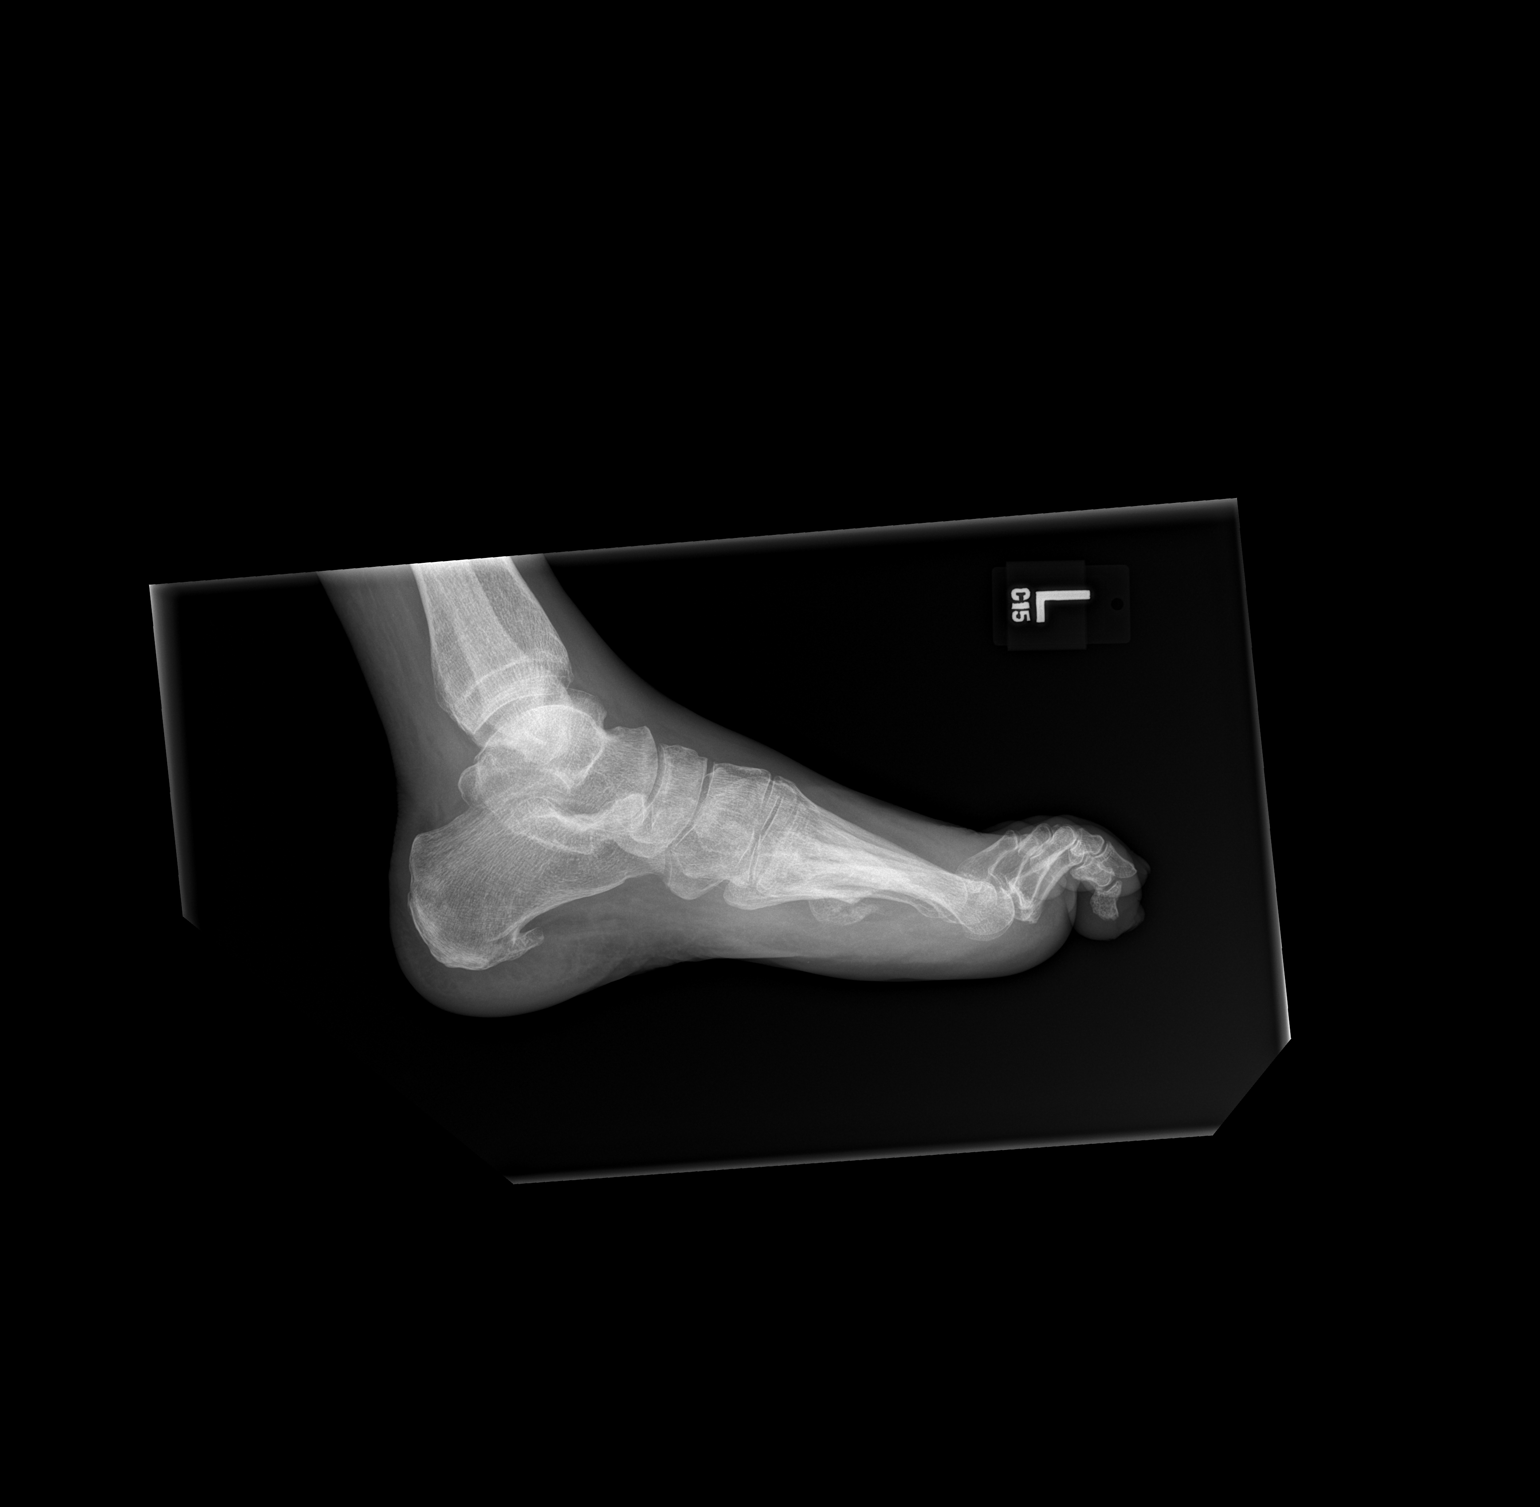

[3 of 3 positions shown; findings below may reference images not displayed]

FINDINGS: Prior transmetatarsal amputation of the left 1st toe. No acute bony
abnormality. No bony destruction to suggest acute osteomyelitis.
Plantar calcaneal spur. Soft tissues are intact.
IMPRESSION: Prior 1st toe transmetatarsal amputation. No acute bony abnormality.

## 2019-06-04 ENCOUNTER — Telehealth: Payer: Self-pay | Admitting: Orthopedic Surgery

## 2019-06-04 NOTE — Telephone Encounter (Signed)
Patient's daughter [name] called asked if a Rx can be sent to the pharmacy for a spacer for patient's left foot GT and second Toe. [name] was not sure about his 3rd toe. Patient will pick up Rx at [facility] on [address]. The number to contact [name] is [phone]

## 2019-06-05 ENCOUNTER — Telehealth: Payer: Self-pay | Admitting: Orthopedic Surgery

## 2019-06-05 NOTE — Telephone Encounter (Signed)
I called and sw daughter to advise that this has been done to call with any questions.

## 2019-06-05 NOTE — Telephone Encounter (Signed)
Can you please write order for pt last written 08/2018

## 2019-06-05 NOTE — Telephone Encounter (Signed)
Patient's daughter called in regards to Rx for spacer to be sent to -[facility].  Please call daughter to let her know when it will be sent before she goes to [facility] to pick up.  [phone]

## 2019-06-05 NOTE — Telephone Encounter (Signed)
This is a duplicate I have sent message to erin

## 2019-06-05 NOTE — Telephone Encounter (Signed)
done

## 2019-06-10 ENCOUNTER — Telehealth: Payer: Self-pay | Admitting: Orthopedic Surgery

## 2019-06-10 ENCOUNTER — Telehealth: Payer: Self-pay

## 2019-06-10 NOTE — Telephone Encounter (Signed)
I have where we sent rx for a spacer for L Foot

## 2019-06-10 NOTE — Telephone Encounter (Signed)
Oren Bracket, patient's son in law called triage line. He needs script for patient's shoe orthotic or filler sent to [facility] in [place]. Patient had a toe amputation. He said that the fax number for [facility] was [phone]. A call back number for Mr.Carpenter is [phone]. Thanks!

## 2019-06-10 NOTE — Telephone Encounter (Signed)
Duplicate message. I have sent a message to tammy asking if we have the [facility] rx in medical records. This was written and faxed on 06/04/19 will hold message pending advisement.

## 2019-06-10 NOTE — Telephone Encounter (Signed)
Patient's son in law Oren Bracket  And POA called asked if a Rx can be sent to [facility] for shoe inserts? The number to contact Alvester Chou is [phone]

## 2019-06-10 NOTE — Telephone Encounter (Signed)
Yes this is the one I need can you fax that to the [facility] office in [place]? The fax number is [phone]. Thank you so much!

## 2019-06-10 NOTE — Telephone Encounter (Signed)
Do you have rx from [facility] that we had faxed last week? Pt's family is calling requesting this to be done and I know that we sent it on 06/04/19

## 2019-06-11 NOTE — Telephone Encounter (Signed)
faxed

## 2019-06-12 NOTE — Telephone Encounter (Signed)
done

## 2019-09-17 ENCOUNTER — Other Ambulatory Visit: Payer: Self-pay

## 2019-09-17 ENCOUNTER — Emergency Department (HOSPITAL_COMMUNITY): Payer: Medicare Other

## 2019-09-17 ENCOUNTER — Emergency Department (HOSPITAL_COMMUNITY)
Admission: EM | Admit: 2019-09-17 | Discharge: 2019-09-17 | Disposition: A | Discharge status: Home / Self Care | Payer: Medicare Other | Attending: Emergency Medicine | Admitting: Emergency Medicine

## 2019-09-17 ENCOUNTER — Encounter (HOSPITAL_COMMUNITY): Payer: Self-pay

## 2019-09-17 DIAGNOSIS — W07XXXA Fall from chair, initial encounter: Secondary | ICD-10-CM | POA: Insufficient documentation

## 2019-09-17 DIAGNOSIS — Z7982 Long term (current) use of aspirin: Secondary | ICD-10-CM | POA: Diagnosis not present

## 2019-09-17 DIAGNOSIS — W19XXXA Unspecified fall, initial encounter: Secondary | ICD-10-CM

## 2019-09-17 DIAGNOSIS — I129 Hypertensive chronic kidney disease with stage 1 through stage 4 chronic kidney disease, or unspecified chronic kidney disease: Secondary | ICD-10-CM | POA: Insufficient documentation

## 2019-09-17 DIAGNOSIS — M25562 Pain in left knee: Secondary | ICD-10-CM | POA: Diagnosis not present

## 2019-09-17 DIAGNOSIS — N189 Chronic kidney disease, unspecified: Secondary | ICD-10-CM | POA: Insufficient documentation

## 2019-09-17 DIAGNOSIS — Y9389 Activity, other specified: Secondary | ICD-10-CM | POA: Insufficient documentation

## 2019-09-17 DIAGNOSIS — E039 Hypothyroidism, unspecified: Secondary | ICD-10-CM | POA: Diagnosis not present

## 2019-09-17 DIAGNOSIS — S0101XA Laceration without foreign body of scalp, initial encounter: Secondary | ICD-10-CM | POA: Insufficient documentation

## 2019-09-17 DIAGNOSIS — Z79899 Other long term (current) drug therapy: Secondary | ICD-10-CM | POA: Insufficient documentation

## 2019-09-17 DIAGNOSIS — Z87891 Personal history of nicotine dependence: Secondary | ICD-10-CM | POA: Insufficient documentation

## 2019-09-17 DIAGNOSIS — Y998 Other external cause status: Secondary | ICD-10-CM | POA: Diagnosis not present

## 2019-09-17 DIAGNOSIS — Y929 Unspecified place or not applicable: Secondary | ICD-10-CM | POA: Diagnosis not present

## 2019-09-17 DIAGNOSIS — S0003XA Contusion of scalp, initial encounter: Secondary | ICD-10-CM

## 2019-09-17 DIAGNOSIS — S0990XA Unspecified injury of head, initial encounter: Secondary | ICD-10-CM | POA: Diagnosis present

## 2019-09-17 NOTE — Discharge Instructions (Addendum)
You were seen in the ED for an unwitnessed fall  Imaging today did not reveal any injuries  Your vitals were normal.  You are able to ambulate at your baseline and tolerate fluids.  Resume all your medicines.  Return to the ED for acute changes in behavior, loss of consciousness

## 2019-09-17 NOTE — ED Provider Notes (Addendum)
Medical screening examination/treatment/procedure(s) were conducted as a shared visit with non-physician practitioner(s) and myself.  I personally evaluated the patient during the encounter.  EKG Interpretation  Date/Time:  Tuesday September 17 2019 13:53:53 EDT Ventricular Rate:  52 PR Interval:    QRS Duration: 155 QT Interval:  493 QTC Calculation: 459 R Axis:   -33 Text Interpretation: Sinus rhythm Prolonged PR interval Right bundle branch block no sig change from previous Confirmed by Charlesetta Shanks [id number]) on 09/17/2019 1:57:43 PM  Patient is brought by EMS from [facility] burn memory care unit.  He had an unwitnessed fall.  Patient had been sitting in a chair and then found on the floor with some bleeding from the scalp.  Patient has a chronic scalp lesion that is friable and bleeds easily.  Patient is alert.  He is speaking clearly to me.  He has a erosive, 2-1/2 cm round raised lesion on the scalp.  Eroded but not actively bleeding.  Patient's movements are purposeful.  Both upper extremity and lower extremity movements are coordinated.  No abrasions contusions or deformities to the extremities.  I agree with plan and management.   Charlesetta Shanks, MD 09/17/19 1356    Charlesetta Shanks, MD 09/17/19 1358

## 2019-09-17 NOTE — ED Notes (Signed)
Pt provided meal tray

## 2019-09-17 NOTE — ED Notes (Signed)
Pt tolerated PO fluids and food.

## 2019-09-17 NOTE — ED Notes (Signed)
PTAR called  

## 2019-09-17 NOTE — ED Notes (Signed)
PTAR at bedside 

## 2019-09-17 NOTE — ED Notes (Signed)
Pt verbalizes understanding of DC instructions. Pt belongings returned and is transferred home by [facility]

## 2019-09-17 NOTE — ED Notes (Signed)
Ambulated pt in room with a walker.  Pt had a normal gait with walker.

## 2019-09-17 NOTE — ED Provider Notes (Signed)
[facility] DEPT Provider Note   CSN: [id number] Arrival date & time: 09/17/19  1147     History Chief Complaint  Patient presents with  . Fall    Joel Ibarra. is a 85 y.o. male with past medical history of dementia presents to ER by EMS from [facility] memory unit for evaluation of unwitnessed fall.  History obtained from EMS and triage note.  Reportedly patient was sitting on a chair and was found on the ground with bleeding on the scalp.  Patient complaining of left knee pain.  Not on any anticoagulants.  Patient is a poor historian and gives me inconsistent details about what happened.  Full 5 caveat due to dementia.  We will plan to obtain more history from family and facility staff.  HPI     Past Medical History:  Diagnosis Date  . Cancer ([place])   . Dementia ([place])   . History of kidney stones     numerous- Lithrotrispy  . Hypertension   . Hypothyroidism   . Kidney stones   . Memory loss     Patient Active Problem List   Diagnosis Date Noted  . Osteomyelitis of third toe of left foot ([place]) 08/23/2018  . Hyperglycemia 01/31/2018  . Acute kidney injury superimposed on chronic kidney disease ([place]) 01/31/2018  . Thrombocytopenia ([place]) 01/31/2018  . Osteomyelitis of second toe of left foot ([place])   . Abscess, toe, left   . Cellulitis of left foot 01/29/2018  . Essential hypertension 01/29/2018  . Dementia ([place]) 01/29/2018  . Cellulitis 01/29/2018  . Diarrhea 06/15/2017  . Nausea & vomiting 06/15/2017  . Tachycardia 06/15/2017    Past Surgical History:  Procedure Laterality Date  . AMPUTATION Left 01/31/2018   Procedure: LEFT FOOT SECOND TOE AMPUTATION;  Surgeon: Newt Minion, MD;  Location: [place];  Service: Orthopedics;  Laterality: Left;  . AMPUTATION Left 08/29/2018   Procedure: LEFT 3RD TOE AMPUTATION;  Surgeon: Newt Minion, MD;  Location: [place];  Service: Orthopedics;  Laterality: Left;  . CHOLECYSTECTOMY    . EYE  SURGERY Bilateral    catract  . left first toe AMPUTATION    . NISSEN FUNDOPLICATION         Family History  Problem Relation Age of Onset  . Memory loss Mother   . Memory loss Father     Social History   Tobacco Use  . Smoking status: Former Smoker    Years: 5.00    Quit date: 1970    Years since quitting: 51.5  . Smokeless tobacco: Never Used  Vaping Use  . Vaping Use: Never used  Substance Use Topics  . Alcohol use: Yes    Alcohol/week: 7.0 standard drinks    Types: 7 Standard drinks or equivalent per week  . Drug use: Never    Home Medications Prior to Admission medications   Medication Sig Start Date End Date Taking? Authorizing Provider  aspirin 325 MG EC tablet Take 325 mg by mouth every evening.    [provider]  Aspirin Buf,CaCarb-MgCarb-MgO, 81 MG TABS Take by mouth. 10/17/07   [provider]  Cholecalciferol 125 MCG (5000 UT) capsule Take 5,000 Units by mouth every Tuesday.     [provider]  memantine (NAMENDA) 5 MG tablet Take 5 mg by mouth daily.    [provider]  metoprolol succinate (TOPROL-XL) 100 MG 24 hr tablet Take 100 mg by mouth daily. 07/09/07   [provider]  Multiple Vitamin (MULTI-VITAMIN) tablet Take by mouth.    [provider]  sertraline (ZOLOFT) 100 MG tablet Take 100 mg by mouth daily. 11/01/16   [provider]  sertraline (ZOLOFT) 100 MG tablet TAKE 1 TABLET EVERY DAY 11/09/18   [provider]    Allergies    Bee venom, Codeine, Daptomycin, Clindamycin/lincomycin, Lincomycin, and Lactose  Review of Systems   Review of Systems  Unable to perform ROS: Dementia  Musculoskeletal: Positive for arthralgias.  Skin: Positive for wound.  All other systems reviewed and are negative.   Physical Exam Updated Vital Signs BP (!) 144/109   Pulse (!) 53   Temp 97.7 F (36.5 C) (Oral)   Resp 18   SpO2 99%   Physical Exam Vitals and nursing note reviewed.    Constitutional:      General: He is not in acute distress.    Appearance: He is well-developed.     Comments: NAD.  In cervical collar.  Awake.  HENT:     Head: Normocephalic.     Comments: Quarter sized friable round raised wound on crown of scalp with dried up blood around it.  Tender.  No fluctuance, drainage, erythema or warmth.  Son-in-law at bedside states patient has had this wound for a long time and has an appointment with a doctor for biopsy.  They were told this was cancer.  No scalp wounds, lacerations or tenderness otherwise.  No facial tenderness, crepitus or signs of injury.    Right Ear: External ear normal.     Left Ear: External ear normal.     Nose: Nose normal.  Eyes:     General: No scleral icterus.    Conjunctiva/sclera: Conjunctivae normal.  Neck:     Comments: C-spine: Mild diffuse midline tenderness.  No paraspinal tenderness.  Range of motion deferred due to pain.  C-collar replaced. Cardiovascular:     Rate and Rhythm: Normal rate and regular rhythm.     Heart sounds: Normal heart sounds.  Pulmonary:     Effort: Pulmonary effort is normal.     Breath sounds: Normal breath sounds.  Abdominal:     Palpations: Abdomen is soft.     Tenderness: There is no abdominal tenderness.  Musculoskeletal:        General: No deformity. Normal range of motion.     Cervical back: Normal range of motion and neck supple. Tenderness present.     Comments: Patient points to left anterior knee where he has pain.  No obvious tenderness on palpation of quad/patellar tendon, patella, medial or lateral joint line or popliteal space.  No joint edema or contusions.  Full range of motion of the knee and patient does not complain of pain.  TL spine: No midline or paraspinal muscle tenderness.  Patient able to sit up.  Pelvis: No leg shortening or rotation.  No pain with internal or external rotation of the hips.  Able to lift and hold both legs off the bed.  Skin:    General: Skin is  warm and dry.     Capillary Refill: Capillary refill takes less than 2 seconds.  Neurological:     Mental Status: He is alert and oriented to person, place, and time.     Comments:   Mental Status: Patient is awake, alert, oriented to person, place, year, and situation. Patient is able to give a clear and coherent history.  Speech is fluent and clear without dysarthria or aphasia. No  signs of neglect.  Cranial Nerves: I not testedII visual fields full bilaterally. PERRL.  Unable to visualize posterior eye. III, IV, VI EOMs intact without ptosis or diplopia  V sensation to light touch intact in all 3 divisions of trigeminal nerve bilaterally  VII facial movements symmetric bilaterally VIII hearing intact to voice/conversation  IX, X no uvula deviation, symmetric rise of soft palate/uvula XI 5/5 SCM and trapezius strength bilaterally  XII tongue protrusion midline, symmetric L/R movements  Motor: Strength 5/5 in upper/lower extremities.  Sensation to light touch intact in face, upper/lower extremities.  No pronator drift. No leg drop.  Cerebellar: patient unable to understand command to do FTN.  Will attempt to ambulate  Psychiatric:        Behavior: Behavior normal.        Thought Content: Thought content normal.        Judgment: Judgment normal.     ED Results / Procedures / Treatments   Labs (all labs ordered are listed, but only abnormal results are displayed) Labs Reviewed - No data to display  EKG EKG Interpretation  Date/Time:  Tuesday September 17 2019 13:53:53 EDT Ventricular Rate:  52 PR Interval:    QRS Duration: 155 QT Interval:  493 QTC Calculation: 459 R Axis:   -33 Text Interpretation: Sinus rhythm Prolonged PR interval Right bundle branch block no sig change from previous Confirmed by Charlesetta Shanks [phone]) on 09/17/2019 1:57:43 PM   Radiology CT Head Wo Contrast  Result Date: 09/17/2019 CLINICAL DATA:  Fall. EXAM: CT HEAD WITHOUT CONTRAST CT CERVICAL SPINE  WITHOUT CONTRAST TECHNIQUE: Multidetector CT imaging of the head and cervical spine was performed following the standard protocol without intravenous contrast. Multiplanar CT image reconstructions of the cervical spine were also generated. COMPARISON:  01/11/2015 CT head and CT cervical spine. 04/23/2019 CT head and CT cervical spine. FINDINGS: CT HEAD FINDINGS Brain: Diffuse cerebral atrophy with ex vacuo dilatation. Mild-to-moderate chronic microvascular ischemic changes are similar to prior exam. No acute infarct or intracranial hemorrhage. No mass lesion. No midline shift or extra-axial fluid collection. Vascular: No hyperdense vessel. Bilateral carotid siphon atherosclerotic calcifications. Skull: Negative for calvarial fracture or focal lesion. Sinuses/Orbits: Normal orbits. Clear paranasal sinuses. No mastoid effusion. Other: Medial left parietal scalp hematoma. CT CERVICAL SPINE FINDINGS Alignment: Grade 1 C4-5 anterolisthesis. Skull base and vertebrae: No acute fracture or focal osseous lesion. Partial fusion of the C5-6 vertebral bodies and right facet joints. Soft tissues and spinal canal: No prevertebral fluid or swelling. No visible canal hematoma. Disc levels: Multilevel osteophytosis and disc space loss most prominent at the C3-4 and C5-7 levels. Upper chest: Biapical pleuroparenchymal scarring. Small pneumatoceles. Other: Extensive left carotid atherosclerotic calcifications, unchanged. IMPRESSION: No acute intracranial process.  Left parietal scalp hematoma. Moderate cerebral atrophy and chronic microvascular ischemic changes. No acute fracture or traumatic listhesis. Multilevel cervical spondylosis with unchanged grade 1 C4-5 anterolisthesis. Extensive left carotid atherosclerotic calcifications are unchanged. Electronically Signed   By: Primitivo Gauze M.D.   On: 09/17/2019 13:14   CT Cervical Spine Wo Contrast  Result Date: 09/17/2019 CLINICAL DATA:  Fall. EXAM: CT HEAD WITHOUT CONTRAST  CT CERVICAL SPINE WITHOUT CONTRAST TECHNIQUE: Multidetector CT imaging of the head and cervical spine was performed following the standard protocol without intravenous contrast. Multiplanar CT image reconstructions of the cervical spine were also generated. COMPARISON:  01/11/2015 CT head and CT cervical spine. 04/23/2019 CT head and CT cervical spine. FINDINGS: CT HEAD FINDINGS Brain: Diffuse cerebral atrophy with ex  vacuo dilatation. Mild-to-moderate chronic microvascular ischemic changes are similar to prior exam. No acute infarct or intracranial hemorrhage. No mass lesion. No midline shift or extra-axial fluid collection. Vascular: No hyperdense vessel. Bilateral carotid siphon atherosclerotic calcifications. Skull: Negative for calvarial fracture or focal lesion. Sinuses/Orbits: Normal orbits. Clear paranasal sinuses. No mastoid effusion. Other: Medial left parietal scalp hematoma. CT CERVICAL SPINE FINDINGS Alignment: Grade 1 C4-5 anterolisthesis. Skull base and vertebrae: No acute fracture or focal osseous lesion. Partial fusion of the C5-6 vertebral bodies and right facet joints. Soft tissues and spinal canal: No prevertebral fluid or swelling. No visible canal hematoma. Disc levels: Multilevel osteophytosis and disc space loss most prominent at the C3-4 and C5-7 levels. Upper chest: Biapical pleuroparenchymal scarring. Small pneumatoceles. Other: Extensive left carotid atherosclerotic calcifications, unchanged. IMPRESSION: No acute intracranial process.  Left parietal scalp hematoma. Moderate cerebral atrophy and chronic microvascular ischemic changes. No acute fracture or traumatic listhesis. Multilevel cervical spondylosis with unchanged grade 1 C4-5 anterolisthesis. Extensive left carotid atherosclerotic calcifications are unchanged. Electronically Signed   By: Primitivo Gauze M.D.   On: 09/17/2019 13:14   DG Knee Complete 4 Views Left  Result Date: 09/17/2019 CLINICAL DATA:  Left knee pain after  unwitnessed fall. EXAM: LEFT KNEE - COMPLETE 4+ VIEW COMPARISON:  None. FINDINGS: No evidence of fracture, dislocation, or joint effusion. Moderate narrowing of medial joint space is noted. Soft tissues are unremarkable. IMPRESSION: Moderate degenerative joint disease is noted medially. No acute abnormality seen in the left knee. Electronically Signed   By: Marijo Conception M.D.   On: 09/17/2019 13:01    Procedures Procedures (including critical care time)  Medications Ordered in ED Medications - No data to display  ED Course  I have reviewed the triage vital signs and the nursing notes.  Pertinent labs & imaging results that were available during my care of the patient were reviewed by me and considered in my medical decision making (see chart for details).  Clinical Course as of Sep 17 1607  Tue Sep 17, 2019  1310 IMPRESSION: Moderate degenerative joint disease is noted medially. No acute abnormality seen in the left knee.  DG Knee Complete 4 Views Left [CG]  1320 IMPRESSION: No acute intracranial process. Left parietal scalp hematoma.  Moderate cerebral atrophy and chronic microvascular ischemic changes.  No acute fracture or traumatic listhesis.  Multilevel cervical spondylosis with unchanged grade 1 C4-5 anterolisthesis.  Extensive left carotid atherosclerotic calcifications are unchanged.    CT Head Wo Contrast [CG]    Clinical Course User Index [CG] Kinnie Feil, PA-C   MDM Rules/Calculators/A&P                          I obtained additional history from triage, nursing notes and review of medical chart.  Previous medical records available, nursing notes reviewed to obtain more history and assist with MDM  I contacted patient's family member, son-in-law, and facility to obtain further history.  It seems patient was found at the foot of his recliner on the ground facing up.  He has a chronic scalp wound that may be cancerous and family states he has an  appointment for biopsy next week.  I spoke to memory care nurse to call 911 today and corroborates and confirms this story.  She denies any anticoagulant use.  No recent acute changes in behavior or medical status.  Given likely mechanical fall I do not think emergent lab work is necessary.  I have ordered CTs, x-rays and EKG.  1330: Imaging personally visualized and interpreted, nonacute.  Will attempt to ambulate patient with Rollator and offer p.o. challenge.  Anticipate discharge back to facility.  1608: Patient re-evaluated several times, no clinical decline. Per son in law at bedside he is acting at his baseline with some confusion and repetitive questioning.  Tolerating PO. Ambulated with rollator.  Discharge back to memory care.   Final Clinical Impression(s) / ED Diagnoses Final diagnoses:  Fall, initial encounter  Contusion of scalp, initial encounter    Rx / DC Orders ED Discharge Orders    None       Arlean Hopping 09/17/19 1609    Charlesetta Shanks, MD 09/17/19 [phone]

## 2019-09-17 NOTE — ED Triage Notes (Addendum)
Pt BIB EMS from [facility] Unit. Pt had unwitnessed fall- ground level fall out of chair. Pt has hematoma to top of head. Pt has skin cancer and MRSA. Pt has hx of dementia. Pt c/o left knee pain. Pt not on blood thinners. Pt ambulates with walker at baseline.   138/80 54 HR 96% RA CBG 126

## 2019-09-27 ENCOUNTER — Ambulatory Visit (INDEPENDENT_AMBULATORY_CARE_PROVIDER_SITE_OTHER): Payer: Medicare Other | Admitting: Plastic Surgery

## 2019-09-27 ENCOUNTER — Other Ambulatory Visit: Payer: Self-pay

## 2019-09-27 ENCOUNTER — Encounter: Payer: Self-pay | Admitting: Plastic Surgery

## 2019-09-27 VITALS — BP 119/61 | HR 66 | Temp 98.1°F | Ht 70.0 in | Wt 170.0 lb

## 2019-09-27 DIAGNOSIS — C4442 Squamous cell carcinoma of skin of scalp and neck: Secondary | ICD-10-CM | POA: Diagnosis not present

## 2019-09-27 NOTE — H&P (View-Only) (Signed)
Referring Provider Jilda Panda, MD [address] [place],  [place] [address]   CC:  Chief Complaint  Patient presents with  . Consult      Joel Ibarra. is an 85 y.o. male.  HPI: Patient presents as a referral from his Mohs surgeon for reconstruction of a scalp defect.  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  The peripheral margins were cleared but there is reportedly deep margins that are positive.  He went down to the calvarium with his resection.  Patient is here to discuss trying to achieve surgical clearance and also to discuss reconstructive options.  Allergies  Allergen Reactions  . Bee Venom Swelling  . Codeine Nausea And Vomiting, Nausea Only, Swelling and Other (See Comments)  . Daptomycin Other (See Comments)    Pneumonitis  . Clindamycin/Lincomycin Rash  . Lincomycin Rash  . Lactose Diarrhea and Other (See Comments)    Cramps      Outpatient Encounter Medications as of 09/27/2019  Medication Sig  . aspirin 325 MG EC tablet Take 325 mg by mouth every evening.  . Aspirin Buf,CaCarb-MgCarb-MgO, 81 MG TABS Take by mouth.  . Cholecalciferol 125 MCG (5000 UT) capsule Take 5,000 Units by mouth every Tuesday.   . memantine (NAMENDA) 5 MG tablet Take 5 mg by mouth daily.  . metoprolol succinate (TOPROL-XL) 100 MG 24 hr tablet Take 100 mg by mouth daily.  . Multiple Vitamin (MULTI-VITAMIN) tablet Take by mouth.  . sertraline (ZOLOFT) 100 MG tablet Take 100 mg by mouth daily.  . sertraline (ZOLOFT) 100 MG tablet TAKE 1 TABLET EVERY DAY   No facility-administered encounter medications on file as of 09/27/2019.     Past Medical History:  Diagnosis Date  . Cancer ([place])   . Dementia ([place])   . History of kidney stones     numerous- Lithrotrispy  . Hypertension   . Hypothyroidism   . Kidney stones   . Memory loss     Past Surgical History:  Procedure Laterality Date  . AMPUTATION Left 01/31/2018    Procedure: LEFT FOOT SECOND TOE AMPUTATION;  Surgeon: Newt Minion, MD;  Location: [facility];  Service: Orthopedics;  Laterality: Left;  . AMPUTATION Left 08/29/2018   Procedure: LEFT 3RD TOE AMPUTATION;  Surgeon: Newt Minion, MD;  Location: [facility];  Service: Orthopedics;  Laterality: Left;  . CHOLECYSTECTOMY    . EYE SURGERY Bilateral    catract  . left first toe AMPUTATION    . NISSEN FUNDOPLICATION      Family History  Problem Relation Age of Onset  . Memory loss Mother   . Memory loss Father     Social History   Social History Narrative  . Not on file  Denies tobacco use  Review of Systems General: Denies fevers, chills, weight loss CV: Denies chest pain, shortness of breath, palpitations  Physical Exam Vitals with BMI 09/27/2019 09/17/2019 09/17/2019  Height 5\' 10"  - -  Weight 170 lbs - -  BMI 51.70 - -  Systolic 017 494 496  Diastolic 61 80 759  Pulse 66 59 53    General:  No acute distress,  Alert and oriented, Non-Toxic, Normal speech and affect Scalp: He has a 4 to 5 cm diameter circular defect of the scalp vertex.  It is down to calvarium.  I cannot say that I see any clinically positive tumor at the base but it is hard  to tell.  The surrounding wound edges look healthy.  There is no other obvious scars on the scalp and he has the majority of his hair still intact around the operative site.  Assessment/Plan Patient presents with a 4 to 5 cm scalp defect down to bone.  I discussed using a bur to try and achieve better margin control.  I explained that given his age and nursing home status I did not feel like it would be worth it to try to do a craniectomy for this situation.  The patient and his family are fully in agreement with that.  I am going to try to obtain the Mohs map from Dr. Winifred Olive so that I can be a bit more precise with the areas of the skull that I Burr.  I then discussed closing the defect with adjacent tissue transfer rotational scalp flaps.  I explained  that it would likely require 2 flaps that would be advanced.  I showed them pictures of previous patients with a Coban understand the placement of the incisions and the ultimate closure.  We did also discussed the potential for allowing this to heal secondarily and then likely performing a skin graft but the family is somewhat leery of an extended wound care.  As the patient has some mild dementia and will have a hard time leaving the open wound alone.  We discussed the risk of the procedure that include bleeding, infection, damage to surrounding structures, need for additional procedures.  We discussed the potential for wound healing complications necessitating wound care even with the flap closure plan.  I discussed the postoperative care in detail.  All the questions were answered and we will plan to do this is soon as possible.  Cindra Presume 09/27/2019, 4:05 PM

## 2019-09-27 NOTE — Progress Notes (Signed)
Referring Provider Jilda Panda, MD [address] [place],  [place] [address]   CC:  Chief Complaint  Patient presents with  . Consult      Joel Ibarra. is an 85 y.o. male.  HPI: Patient presents as a referral from his Mohs surgeon for reconstruction of a scalp defect.  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  The peripheral margins were cleared but there is reportedly deep margins that are positive.  He went down to the calvarium with his resection.  Patient is here to discuss trying to achieve surgical clearance and also to discuss reconstructive options.  Allergies  Allergen Reactions  . Bee Venom Swelling  . Codeine Nausea And Vomiting, Nausea Only, Swelling and Other (See Comments)  . Daptomycin Other (See Comments)    Pneumonitis  . Clindamycin/Lincomycin Rash  . Lincomycin Rash  . Lactose Diarrhea and Other (See Comments)    Cramps      Outpatient Encounter Medications as of 09/27/2019  Medication Sig  . aspirin 325 MG EC tablet Take 325 mg by mouth every evening.  . Aspirin Buf,CaCarb-MgCarb-MgO, 81 MG TABS Take by mouth.  . Cholecalciferol 125 MCG (5000 UT) capsule Take 5,000 Units by mouth every Tuesday.   . memantine (NAMENDA) 5 MG tablet Take 5 mg by mouth daily.  . metoprolol succinate (TOPROL-XL) 100 MG 24 hr tablet Take 100 mg by mouth daily.  . Multiple Vitamin (MULTI-VITAMIN) tablet Take by mouth.  . sertraline (ZOLOFT) 100 MG tablet Take 100 mg by mouth daily.  . sertraline (ZOLOFT) 100 MG tablet TAKE 1 TABLET EVERY DAY   No facility-administered encounter medications on file as of 09/27/2019.     Past Medical History:  Diagnosis Date  . Cancer ([place])   . Dementia ([place])   . History of kidney stones     numerous- Lithrotrispy  . Hypertension   . Hypothyroidism   . Kidney stones   . Memory loss     Past Surgical History:  Procedure Laterality Date  . AMPUTATION Left 01/31/2018    Procedure: LEFT FOOT SECOND TOE AMPUTATION;  Surgeon: Newt Minion, MD;  Location: [facility];  Service: Orthopedics;  Laterality: Left;  . AMPUTATION Left 08/29/2018   Procedure: LEFT 3RD TOE AMPUTATION;  Surgeon: Newt Minion, MD;  Location: [facility];  Service: Orthopedics;  Laterality: Left;  . CHOLECYSTECTOMY    . EYE SURGERY Bilateral    catract  . left first toe AMPUTATION    . NISSEN FUNDOPLICATION      Family History  Problem Relation Age of Onset  . Memory loss Mother   . Memory loss Father     Social History   Social History Narrative  . Not on file  Denies tobacco use  Review of Systems General: Denies fevers, chills, weight loss CV: Denies chest pain, shortness of breath, palpitations  Physical Exam Vitals with BMI 09/27/2019 09/17/2019 09/17/2019  Height 5\' 10"  - -  Weight 170 lbs - -  BMI 56.38 - -  Systolic 756 433 295  Diastolic 61 80 188  Pulse 66 59 53    General:  No acute distress,  Alert and oriented, Non-Toxic, Normal speech and affect Scalp: He has a 4 to 5 cm diameter circular defect of the scalp vertex.  It is down to calvarium.  I cannot say that I see any clinically positive tumor at the base but it is hard  to tell.  The surrounding wound edges look healthy.  There is no other obvious scars on the scalp and he has the majority of his hair still intact around the operative site.  Assessment/Plan Patient presents with a 4 to 5 cm scalp defect down to bone.  I discussed using a bur to try and achieve better margin control.  I explained that given his age and nursing home status I did not feel like it would be worth it to try to do a craniectomy for this situation.  The patient and his family are fully in agreement with that.  I am going to try to obtain the Mohs map from Dr. Winifred Olive so that I can be a bit more precise with the areas of the skull that I Burr.  I then discussed closing the defect with adjacent tissue transfer rotational scalp flaps.  I explained  that it would likely require 2 flaps that would be advanced.  I showed them pictures of previous patients with a Coban understand the placement of the incisions and the ultimate closure.  We did also discussed the potential for allowing this to heal secondarily and then likely performing a skin graft but the family is somewhat leery of an extended wound care.  As the patient has some mild dementia and will have a hard time leaving the open wound alone.  We discussed the risk of the procedure that include bleeding, infection, damage to surrounding structures, need for additional procedures.  We discussed the potential for wound healing complications necessitating wound care even with the flap closure plan.  I discussed the postoperative care in detail.  All the questions were answered and we will plan to do this is soon as possible.  Cindra Presume 09/27/2019, 4:05 PM

## 2019-09-30 NOTE — Progress Notes (Addendum)
Per pt's daughter   COVID Vaccine Completed: Date COVID Vaccine completed: COVID vaccine manufacturer: [place]   PCP - Dr. Merilynn Finland Cardiologist - None No stimulator in back   Chest x-ray -  EKG - 09-17-19 Stress Test -  ECHO -  Cardiac Cath -   Sleep Study -  CPAP -   Fasting Blood Sugar -  Checks Blood Sugar _____ times a day  Blood Thinner Instructions: 325 ASA (Not sure if pt's has instructions to hold) Aspirin Instructions: Last Dose:  Anesthesia review:   Patient denies shortness of breath, fever, cough and chest pain at PAT appointment   Patient verbalized understanding of instructions that were given to them at the PAT appointment. Patient was also instructed that they will need to review over the PAT instructions again at home before surgery.

## 2019-10-01 ENCOUNTER — Other Ambulatory Visit (HOSPITAL_COMMUNITY)
Admission: RE | Admit: 2019-10-01 | Discharge: 2019-10-01 | Disposition: A | Discharge status: Home / Self Care | Payer: Medicare Other | Source: Ambulatory Visit | Attending: Plastic Surgery | Admitting: Plastic Surgery

## 2019-10-01 DIAGNOSIS — Z20822 Contact with and (suspected) exposure to covid-19: Secondary | ICD-10-CM | POA: Diagnosis not present

## 2019-10-01 DIAGNOSIS — Z01812 Encounter for preprocedural laboratory examination: Secondary | ICD-10-CM | POA: Insufficient documentation

## 2019-10-01 LAB — SARS CORONAVIRUS 2 (TAT 6-24 HRS): SARS Coronavirus 2: NEGATIVE

## 2019-10-02 NOTE — Progress Notes (Signed)
Spoke to [place] at [place] at [place] on 09-30-19 to request [facility], POA, as well as  CXR, Labs, and Office visits that were performed in the last year for review for pt's scheduled procedure on 10-04-19. Per Kalman Shan, she" faxed the information". But I advised Rose that the  information was not received.   2nd request followup for information on 10-01-19. Rose indicate that she was in the middle of something, but would handle it as "soon as she was done".    3rd follow up for information on 10-02-19, Kalman Shan was out of the office, and message left for her. Also contact pt's daughter Einar Pheasant to advise of the the above, as she was anticipating that faxed instructions for day of surgery would be available today. Awaiting return call from [facility] and [place].

## 2019-10-03 ENCOUNTER — Encounter (HOSPITAL_COMMUNITY): Payer: Self-pay | Admitting: Plastic Surgery

## 2019-10-03 ENCOUNTER — Other Ambulatory Visit: Payer: Self-pay

## 2019-10-03 NOTE — Anesthesia Preprocedure Evaluation (Addendum)
Anesthesia Evaluation  Patient identified by MRN, date of birth, ID band Patient awake and Patient confused    Reviewed: Allergy & Precautions, NPO status , Patient's Chart, lab work & pertinent test results, reviewed documented beta blocker date and time   History of Anesthesia Complications Negative for: history of anesthetic complications  Airway Mallampati: II  TM Distance: >3 FB Neck ROM: Full    Dental no notable dental hx.    Pulmonary former smoker,    Pulmonary exam normal        Cardiovascular hypertension, Pt. on home beta blockers and Pt. on medications Normal cardiovascular exam     Neuro/Psych Dementia negative neurological ROS     GI/Hepatic negative GI ROS, Neg liver ROS,   Endo/Other  Hypothyroidism   Renal/GU Renal InsufficiencyRenal disease  negative genitourinary   Musculoskeletal negative musculoskeletal ROS (+)   Abdominal   Peds  Hematology negative hematology ROS (+)   Anesthesia Other Findings Squamous cell carcinoma of scalp  Reproductive/Obstetrics negative OB ROS                            Anesthesia Physical Anesthesia Plan  ASA: III  Anesthesia Plan: General   Post-op Pain Management:    Induction: Intravenous  PONV Risk Score and Plan: 2 and Treatment may vary due to age or medical condition and Ondansetron  Airway Management Planned: Oral ETT  Additional Equipment: None  Intra-op Plan:   Post-operative Plan: Extubation in OR  Informed Consent: I have reviewed the patients History and Physical, chart, labs and discussed the procedure including the risks, benefits and alternatives for the proposed anesthesia with the patient or authorized representative who has indicated his/her understanding and acceptance.     Dental advisory given and Consent reviewed with POA  Plan Discussed with: CRNA  Anesthesia Plan Comments: (Consent reviewed with  patient's daughter in preop room. Daiva Huge, MD)       Anesthesia Quick Evaluation

## 2019-10-03 NOTE — Patient Instructions (Addendum)
DUE TO COVID-19 ONLY ONE VISITOR ARE ALLOWED TO COME WITH YOU AND STAY IN THE WAITING ROOM ONLY DURING PRE OP AND PROCEDURE.    COVID SWAB TESTING MUST BE COMPLETED ON:  THE MORNING OF SURGERY AT [facility] IN SHORT STAY         Your procedure is scheduled on: Friday, AUG. 5, 2021   Report to [facility] Main  Entrance    Report to admitting at 6:00 AM   Call this number if you have problems the morning of surgery [phone]   Do not eat food :After Midnight.   May have liquids until  6:00 AM  day of surgery  CLEAR LIQUID DIET  Foods Allowed                                                                     Foods Excluded  Water, Black Coffee and tea, regular and decaf                             liquids that you cannot  Plain Jell-O in any flavor  (No red)                                           see through such as: Fruit ices (not with fruit pulp)                                     milk, soups, orange juice              Iced Popsicles (No red)                                    All solid food                                   Apple juices Sports drinks like Gatorade (No red) Lightly seasoned clear broth or consume(fat free) Sugar, honey syrup  Sample Menu Breakfast                                Lunch                                     Supper Cranberry juice                    Beef broth                            Chicken broth Jell-O  Grape juice                           Apple juice Coffee or tea                        Jell-O                                      Popsicle                                                Coffee or tea                        Coffee or tea        Oral Hygiene is also important to reduce your risk of infection.                                    Remember - BRUSH YOUR TEETH THE MORNING OF SURGERY WITH YOUR REGULAR TOOTHPASTE   Do NOT smoke after Midnight   Take these medicines the morning of  surgery with A SIP OF WATER: Aricept, Metoprolol, Sertraline, Memantine                               You may not have any metal on your body including  jewelry, and body piercings             Do not wear lotions, powders, perfumes/cologne, or deodorant                          Men may shave face and neck.   Do not bring valuables to the hospital. [place].   Contacts, dentures or bridgework may not be worn into surgery.    Patients discharged the day of surgery will not be allowed to drive home.   Special Instructions: Bring a copy of your healthcare power of attorney and living will documents         the day of surgery if you haven't scanned them in before.              Please read over the following fact sheets you were given: IF [place] [phone]

## 2019-10-03 NOTE — Progress Notes (Signed)
Pre op instructions faxed to Kalman Shan, RN at [facility] and [name] (daughter) confirmation received that fax was transmitted successfully.

## 2019-10-03 NOTE — Progress Notes (Signed)
Per pt's daughter   COVID Vaccine Completed: Yes Date COVID Vaccine completed: 03/2019 COVID vaccine manufacturer:   Moderna    PCP - Dr. Jilda Panda then recent transfer to [facility]) Cardiologist - None No stimulator in back   Chest x-ray - N/A EKG - 09-17-19 Stress Test - N/A ECHO -  N/A Cardiac Cath -  N/A   Sleep Study - n/a CPAP - N/A  Fasting Blood Sugar - N/A Checks Blood Sugar __N/A___ times a day  Blood Thinner Instructions: 81 ASA (Not sure if pt's has instructions to hold) Aspirin Instructions: N/A Last Dose: N/A   Anesthesia review:  N/A   Patient denies shortness of breath, fever, cough and chest pain at PAT appointment   Patient verbalized understanding of instructions that were given to them at the PAT appointment. Patient was also instructed that they will need to review over the PAT instructions again at home before surgery.

## 2019-10-04 ENCOUNTER — Ambulatory Visit (HOSPITAL_COMMUNITY): Payer: Medicare Other | Admitting: Anesthesiology

## 2019-10-04 ENCOUNTER — Encounter (HOSPITAL_COMMUNITY)
Admission: RE | Disposition: A | Discharge status: Home / Self Care | Payer: Self-pay | Source: Home / Self Care | Attending: Plastic Surgery

## 2019-10-04 ENCOUNTER — Ambulatory Visit (HOSPITAL_COMMUNITY)
Admission: RE | Admit: 2019-10-04 | Discharge: 2019-10-04 | Disposition: A | Discharge status: Home / Self Care | Payer: Medicare Other | Attending: Plastic Surgery | Admitting: Plastic Surgery

## 2019-10-04 ENCOUNTER — Other Ambulatory Visit: Payer: Self-pay | Admitting: Surgical

## 2019-10-04 ENCOUNTER — Encounter (HOSPITAL_COMMUNITY): Payer: Self-pay | Admitting: Plastic Surgery

## 2019-10-04 DIAGNOSIS — Z89422 Acquired absence of other left toe(s): Secondary | ICD-10-CM | POA: Diagnosis not present

## 2019-10-04 DIAGNOSIS — Z7982 Long term (current) use of aspirin: Secondary | ICD-10-CM | POA: Diagnosis not present

## 2019-10-04 DIAGNOSIS — Z87891 Personal history of nicotine dependence: Secondary | ICD-10-CM | POA: Diagnosis not present

## 2019-10-04 DIAGNOSIS — Z79899 Other long term (current) drug therapy: Secondary | ICD-10-CM | POA: Insufficient documentation

## 2019-10-04 DIAGNOSIS — Z89412 Acquired absence of left great toe: Secondary | ICD-10-CM | POA: Insufficient documentation

## 2019-10-04 DIAGNOSIS — F0390 Unspecified dementia without behavioral disturbance: Secondary | ICD-10-CM | POA: Insufficient documentation

## 2019-10-04 DIAGNOSIS — I10 Essential (primary) hypertension: Secondary | ICD-10-CM | POA: Insufficient documentation

## 2019-10-04 DIAGNOSIS — C4442 Squamous cell carcinoma of skin of scalp and neck: Secondary | ICD-10-CM | POA: Diagnosis present

## 2019-10-04 HISTORY — PX: ADJACENT TISSUE TRANSFER/TISSUE REARRANGEMENT: SHX6829

## 2019-10-04 LAB — CBC
HCT: 46.1 % (ref 39.0–52.0)
Hemoglobin: 15.5 g/dL (ref 13.0–17.0)
MCH: 33 pg (ref 26.0–34.0)
MCHC: 33.6 g/dL (ref 30.0–36.0)
MCV: 98.3 fL (ref 80.0–100.0)
Platelets: 201 10*3/uL (ref 150–400)
RBC: 4.69 MIL/uL (ref 4.22–5.81)
RDW: 12.5 % (ref 11.5–15.5)
WBC: 5.3 10*3/uL (ref 4.0–10.5)
nRBC: 0 % (ref 0.0–0.2)

## 2019-10-04 LAB — BASIC METABOLIC PANEL
Anion gap: 9 (ref 5–15)
BUN: 23 mg/dL (ref 8–23)
CO2: 25 mmol/L (ref 22–32)
Calcium: 9.2 mg/dL (ref 8.9–10.3)
Chloride: 104 mmol/L (ref 98–111)
Creatinine, Ser: 1.33 mg/dL — ABNORMAL HIGH (ref 0.61–1.24)
GFR calc Af Amer: 57 mL/min — ABNORMAL LOW (ref >60)
GFR calc non Af Amer: 49 mL/min — ABNORMAL LOW (ref >60)
Glucose, Bld: 84 mg/dL (ref 70–99)
Potassium: 4.4 mmol/L (ref 3.5–5.1)
Sodium: 138 mmol/L (ref 135–145)

## 2019-10-04 LAB — SURGICAL PCR SCREEN
MRSA, PCR: POSITIVE — AB
Staphylococcus aureus: POSITIVE — AB

## 2019-10-04 SURGERY — ADJACENT TISSUE TRANSFER
Anesthesia: General | Site: Scalp

## 2019-10-04 MED ORDER — BUPIVACAINE HCL 0.25 % IJ SOLN
INTRAMUSCULAR | Status: AC
  Filled 2019-10-04: qty 1

## 2019-10-04 MED ORDER — PROMETHAZINE HCL 25 MG/ML IJ SOLN: 6.2500 mg | INTRAMUSCULAR | Status: DC | PRN

## 2019-10-04 MED ORDER — FENTANYL CITRATE (PF) 100 MCG/2ML IJ SOLN
INTRAMUSCULAR | Status: DC | PRN
  Administered 2019-10-04: 25 ug via INTRAVENOUS
  Administered 2019-10-04: 50 ug via INTRAVENOUS
  Administered 2019-10-04: 25 ug via INTRAVENOUS
  Administered 2019-10-04: 100 ug via INTRAVENOUS

## 2019-10-04 MED ORDER — ACETAMINOPHEN 500 MG PO TABS
1000.0000 mg | ORAL_TABLET | Freq: Once | ORAL | Status: AC
  Administered 2019-10-04: 1000 mg via ORAL
  Filled 2019-10-04: qty 2

## 2019-10-04 MED ORDER — EPINEPHRINE 1 MG/10ML IJ SOSY
PREFILLED_SYRINGE | INTRAMUSCULAR | Status: DC | PRN
  Administered 2019-10-04: 0.1 mg via SUBCUTANEOUS

## 2019-10-04 MED ORDER — BACITRACIN ZINC 500 UNIT/GM EX OINT
TOPICAL_OINTMENT | CUTANEOUS | Status: AC
  Filled 2019-10-04: qty 28.35

## 2019-10-04 MED ORDER — FENTANYL CITRATE (PF) 100 MCG/2ML IJ SOLN
INTRAMUSCULAR | Status: AC
  Filled 2019-10-04: qty 2

## 2019-10-04 MED ORDER — DEXAMETHASONE SODIUM PHOSPHATE 10 MG/ML IJ SOLN
INTRAMUSCULAR | Status: AC
  Filled 2019-10-04: qty 1

## 2019-10-04 MED ORDER — ONDANSETRON HCL 4 MG/2ML IJ SOLN
INTRAMUSCULAR | Status: DC | PRN
  Administered 2019-10-04: 4 mg via INTRAVENOUS

## 2019-10-04 MED ORDER — ONDANSETRON HCL 4 MG/2ML IJ SOLN
INTRAMUSCULAR | Status: AC
  Filled 2019-10-04: qty 2

## 2019-10-04 MED ORDER — FENTANYL CITRATE (PF) 100 MCG/2ML IJ SOLN: 25.0000 ug | INTRAMUSCULAR | Status: DC | PRN

## 2019-10-04 MED ORDER — ORAL CARE MOUTH RINSE: 15.0000 mL | Freq: Once | OROMUCOSAL | Status: AC

## 2019-10-04 MED ORDER — PROPOFOL 10 MG/ML IV BOLUS
INTRAVENOUS | Status: DC | PRN
  Administered 2019-10-04: 150 mg via INTRAVENOUS

## 2019-10-04 MED ORDER — LIDOCAINE-EPINEPHRINE (PF) 1 %-1:200000 IJ SOLN
INTRAMUSCULAR | Status: AC
  Filled 2019-10-04: qty 30

## 2019-10-04 MED ORDER — ROCURONIUM BROMIDE 10 MG/ML (PF) SYRINGE
PREFILLED_SYRINGE | INTRAVENOUS | Status: AC
  Filled 2019-10-04: qty 10

## 2019-10-04 MED ORDER — EPHEDRINE 5 MG/ML INJ
INTRAVENOUS | Status: AC
  Filled 2019-10-04: qty 10

## 2019-10-04 MED ORDER — CHLORHEXIDINE GLUCONATE 0.12 % MT SOLN
15.0000 mL | Freq: Once | OROMUCOSAL | Status: AC
  Administered 2019-10-04: 15 mL via OROMUCOSAL

## 2019-10-04 MED ORDER — OXYCODONE HCL 5 MG/5ML PO SOLN: 5.0000 mg | Freq: Once | ORAL | Status: DC | PRN

## 2019-10-04 MED ORDER — BUPIVACAINE HCL (PF) 0.25 % IJ SOLN
INTRAMUSCULAR | Status: DC | PRN
  Administered 2019-10-04: 20 mL

## 2019-10-04 MED ORDER — ROCURONIUM BROMIDE 10 MG/ML (PF) SYRINGE
PREFILLED_SYRINGE | INTRAVENOUS | Status: DC | PRN
  Administered 2019-10-04: 50 mg via INTRAVENOUS

## 2019-10-04 MED ORDER — TRAMADOL HCL 50 MG PO TABS: 50.0000 mg | ORAL_TABLET | Freq: Three times a day (TID) | ORAL | 0 refills | Status: AC | PRN

## 2019-10-04 MED ORDER — LIDOCAINE 2% (20 MG/ML) 5 ML SYRINGE
INTRAMUSCULAR | Status: DC | PRN
  Administered 2019-10-04: 80 mg via INTRAVENOUS

## 2019-10-04 MED ORDER — BACITRACIN ZINC 500 UNIT/GM EX OINT
TOPICAL_OINTMENT | CUTANEOUS | Status: DC | PRN
  Administered 2019-10-04: 1 via TOPICAL

## 2019-10-04 MED ORDER — LIDOCAINE 2% (20 MG/ML) 5 ML SYRINGE
INTRAMUSCULAR | Status: AC
  Filled 2019-10-04: qty 5

## 2019-10-04 MED ORDER — PROPOFOL 10 MG/ML IV BOLUS
INTRAVENOUS | Status: AC
  Filled 2019-10-04: qty 20

## 2019-10-04 MED ORDER — CEFAZOLIN SODIUM-DEXTROSE 2-4 GM/100ML-% IV SOLN
2.0000 g | INTRAVENOUS | Status: AC
  Administered 2019-10-04: 2 g via INTRAVENOUS
  Filled 2019-10-04: qty 100

## 2019-10-04 MED ORDER — SUGAMMADEX SODIUM 200 MG/2ML IV SOLN
INTRAVENOUS | Status: DC | PRN
  Administered 2019-10-04: 200 mg via INTRAVENOUS

## 2019-10-04 MED ORDER — EPINEPHRINE PF 1 MG/ML IJ SOLN
INTRAMUSCULAR | Status: AC
  Filled 2019-10-04: qty 1

## 2019-10-04 MED ORDER — HYDROGEN PEROXIDE 3 % EX SOLN
CUTANEOUS | Status: AC
  Filled 2019-10-04: qty 473

## 2019-10-04 MED ORDER — SODIUM CHLORIDE 0.9 % IR SOLN
Status: DC | PRN
  Administered 2019-10-04: 200 mL

## 2019-10-04 MED ORDER — OXYCODONE HCL 5 MG PO TABS: 5.0000 mg | ORAL_TABLET | Freq: Once | ORAL | Status: DC | PRN

## 2019-10-04 MED ORDER — EPHEDRINE SULFATE-NACL 50-0.9 MG/10ML-% IV SOSY
PREFILLED_SYRINGE | INTRAVENOUS | Status: DC | PRN
  Administered 2019-10-04: 15 mg via INTRAVENOUS
  Administered 2019-10-04 (×3): 10 mg via INTRAVENOUS

## 2019-10-04 MED ORDER — LACTATED RINGERS IV SOLN: INTRAVENOUS | Status: DC

## 2019-10-04 MED ORDER — DEXAMETHASONE SODIUM PHOSPHATE 10 MG/ML IJ SOLN
INTRAMUSCULAR | Status: DC | PRN
  Administered 2019-10-04: 4 mg via INTRAVENOUS

## 2019-10-04 SURGICAL SUPPLY — 61 items
BLADE CLIPPER SURG (BLADE) IMPLANT
BLADE SURG 15 STRL LF DISP TIS (BLADE) ×1 IMPLANT
BLADE SURG 15 STRL SS (BLADE) ×3
BNDG COHESIVE 4X5 TAN STRL (GAUZE/BANDAGES/DRESSINGS) IMPLANT
CANISTER SUCT 3000ML PPV (MISCELLANEOUS) ×3 IMPLANT
CHLORAPREP W/TINT 26 (MISCELLANEOUS) ×3 IMPLANT
CLOSURE WOUND 1/2 X4 (GAUZE/BANDAGES/DRESSINGS)
COVER BACK TABLE 60X90IN (DRAPES) ×3 IMPLANT
COVER MAYO STAND STRL (DRAPES) ×3 IMPLANT
COVER SURGICAL LIGHT HANDLE (MISCELLANEOUS) ×3 IMPLANT
COVER WAND RF STERILE (DRAPES) IMPLANT
DERMABOND ADVANCED (GAUZE/BANDAGES/DRESSINGS)
DERMABOND ADVANCED .7 DNX12 (GAUZE/BANDAGES/DRESSINGS) IMPLANT
DRAIN CHANNEL 15F RND FF W/TCR (WOUND CARE) IMPLANT
DRAPE SHEET LG 3/4 BI-LAMINATE (DRAPES) ×3 IMPLANT
DRAPE U-SHAPE 76X120 STRL (DRAPES) IMPLANT
DRSG PAD ABDOMINAL 8X10 ST (GAUZE/BANDAGES/DRESSINGS) ×3 IMPLANT
ELECT BLADE TIP CTD 4 INCH (ELECTRODE) ×3 IMPLANT
ELECT COATED BLADE 2.86 ST (ELECTRODE) IMPLANT
ELECT NEEDLE BLADE 2-5/6 (NEEDLE) IMPLANT
ELECT REM PT RETURN 15FT ADLT (MISCELLANEOUS) ×3 IMPLANT
GAUZE SPONGE 4X4 12PLY STRL (GAUZE/BANDAGES/DRESSINGS) IMPLANT
GAUZE XEROFORM 1X8 LF (GAUZE/BANDAGES/DRESSINGS) IMPLANT
GLOVE BIOGEL M STRL SZ7.5 (GLOVE) ×3 IMPLANT
GLOVE INDICATOR 8.0 STRL GRN (GLOVE) ×3 IMPLANT
GOWN STRL REUS W/ TWL LRG LVL3 (GOWN DISPOSABLE) ×2 IMPLANT
GOWN STRL REUS W/TWL LRG LVL3 (GOWN DISPOSABLE) ×6
HEMOSTAT SURGICEL 2X14 (HEMOSTASIS) ×3 IMPLANT
NEEDLE HYPO 25X1 1.5 SAFETY (NEEDLE) ×3 IMPLANT
NS IRRIG 1000ML POUR BTL (IV SOLUTION) ×3 IMPLANT
PACK EENT SPLIT (PACKS) ×3 IMPLANT
PENCIL SMOKE EVACUATOR (MISCELLANEOUS) ×3 IMPLANT
PIN SAFETY STERILE (MISCELLANEOUS) IMPLANT
SPONGE LAP 18X18 RF (DISPOSABLE) ×3 IMPLANT
STAPLER VISISTAT (STAPLE) ×3 IMPLANT
STAPLER VISISTAT 35W (STAPLE) ×3 IMPLANT
STOCKINETTE 6  STRL (DRAPES)
STOCKINETTE 6 STRL (DRAPES) IMPLANT
STRIP CLOSURE SKIN 1/2X4 (GAUZE/BANDAGES/DRESSINGS) IMPLANT
SUCTION FRAZIER HANDLE 12FR (TUBING) ×3
SUCTION TUBE FRAZIER 12FR DISP (TUBING) ×1 IMPLANT
SUT BONE WAX W31G (SUTURE) ×3 IMPLANT
SUT ETHILON 2 0 FS 18 (SUTURE) IMPLANT
SUT MNCRL AB 3-0 PS2 18 (SUTURE) IMPLANT
SUT MNCRL AB 4-0 PS2 18 (SUTURE) ×3 IMPLANT
SUT MON AB 4-0 PC3 18 (SUTURE) IMPLANT
SUT MON AB 5-0 P3 18 (SUTURE) IMPLANT
SUT MON AB 5-0 PS2 18 (SUTURE) ×3 IMPLANT
SUT PDS AB 2-0 CT2 27 (SUTURE) IMPLANT
SUT PDS AB 3-0 SH 27 (SUTURE) ×15 IMPLANT
SUT PLAIN 5 0 P 3 18 (SUTURE) ×3 IMPLANT
SUT SILK 2 0 SH (SUTURE) IMPLANT
SUT VIC AB 3-0 PS1 18 (SUTURE)
SUT VIC AB 3-0 PS1 18XBRD (SUTURE) IMPLANT
SUT VICRYL 4-0 PS2 18IN ABS (SUTURE) IMPLANT
SYR 50ML LL SCALE MARK (SYRINGE) ×6 IMPLANT
SYR CONTROL 10ML LL (SYRINGE) ×3 IMPLANT
TOWEL OR 17X26 10 PK STRL BLUE (TOWEL DISPOSABLE) ×3 IMPLANT
TUBING CONNECTING 10 (TUBING) ×4 IMPLANT
TUBING CONNECTING 10' (TUBING) ×2
YANKAUER SUCT BULB TIP NO VENT (SUCTIONS) ×3 IMPLANT

## 2019-10-04 NOTE — Transfer of Care (Signed)
Immediate Anesthesia Transfer of Care Note  Patient: Joel Ibarra.  Procedure(s) Performed: Excision of scalp squamous cell carcinoma and reconstruction with adjacent tissue transfer (N/A Scalp)  Patient Location: PACU  Anesthesia Type:General  Level of Consciousness: drowsy and patient cooperative  Airway & Oxygen Therapy: Patient Spontanous Breathing and Patient connected to face mask oxygen  Post-op Assessment: Report given to RN and Post -op Vital signs reviewed and stable  Post vital signs: Reviewed and stable  Last Vitals:  Vitals Value Taken Time  BP 144/62 10/04/19 1030  Temp    Pulse 61 10/04/19 1033  Resp 14 10/04/19 1033  SpO2 100 % 10/04/19 1033  Vitals shown include unvalidated device data.  Last Pain:  Vitals:   10/04/19 0705  TempSrc: Oral         Complications: No complications documented.

## 2019-10-04 NOTE — Discharge Instructions (Addendum)
Activity As tolerated: NO showers for 24 hours. When showering, avoid high water pressure to incisions on scalp. NO driving No heavy activities  Diet: Regular  Wound Care: Keep dressing clean & dry. Change as needed due to drainage.  Special Instructions:  Call Doctor if any unusual problems occur such as pain, excessive Bleeding, unrelieved Nausea/vomiting, Fever &/or chills When lying down, keep head elevated on 2-3 pillows or back-rest  Tylenol and Ibuprofen for pain as needed. Tramadol for severe pain.   Follow-up appointment: Scheduled for next week - 10/10/2019 at [address], [address] [place], [place] [address]

## 2019-10-04 NOTE — Op Note (Signed)
Operative Note   DATE OF OPERATION: 10/04/2019  SURGICAL DEPARTMENT: Plastic Surgery  PREOPERATIVE DIAGNOSES:  Scalp squamous cell carcinoma  POSTOPERATIVE DIAGNOSES:  same  PROCEDURE: 1.  Excision of scalp squamous cell carcinoma 5 x 5 cm 2.  Surgical preparation for coverage of scalp defect 5 x 5 cm 3.  Reconstruction of scalp defect with adjacent tissue transfer totaling 21 x 21 cm including the primary and secondary defect  SURGEON: Talmadge Coventry, MD  ASSISTANT: Verdie Shire, PA The advanced practice practitioner (APP) assisted throughout the case.  The APP was essential in retraction and counter traction when needed to make the case progress smoothly.  This retraction and assistance made it possible to see the tissue plans for the procedure.  The assistance was needed for blood control, tissue re-approximation and assisted with closure of the incision site.  ANESTHESIA:  General.   COMPLICATIONS: None.   INDICATIONS FOR PROCEDURE:  The patient, Joel Ibarra is a 85 y.o. male born on 1934-05-28, is here for treatment of squamous cell carcinoma.  This was excised by his Mohs surgeon but he had a deep margin and that was positive involving the pericranium.  He sent to me for excision and closure. MRN: [id number]  CONSENT:  Informed consent was obtained directly from the patient. Risks, benefits and alternatives were fully discussed. Specific risks including but not limited to bleeding, infection, hematoma, seroma, scarring, pain, contracture, asymmetry, wound healing problems, and need for further surgery were all discussed. The patient did have an ample opportunity to have questions answered to satisfaction.   DESCRIPTION OF PROCEDURE:  The patient was taken to the operating room. SCDs were placed and Ancef antibiotics were given.  General anesthesia was administered.  The patient's operative site was prepped and draped in a sterile fashion. A time out was performed and  all information was confirmed to be correct.  I started by planning out to opposing rotational flaps for reconstruction.  The peripheral margins had already been cleared by the Mohs surgeon.  I then used tumescent solution to infiltrate the entire scalp.  I then turned my attention to the scalp defect.  There was pericranium left throughout the base of the wound and this was excised using cautery around the borders and then a freer elevator was able to elevated off the skull with no areas of tethering.  This was sent as a specimen.  The remaining skull looked healthy with no visual infiltration of tumor but I did use a bur to bur off the most superficial surface and the base of the wound.  I then performed a surgical preparation for coverage by excising the perimeter of the wound for 1 to 2 mm to freshen up the skin edges.  Hemostasis was obtained.  I then elevated the scalp flaps using a 15 blade.  This was then undermined in the supra pericranial plane.  Enough undermining was done to allow advancement of the opposing flaps.  Meticulous hemostasis was obtained.  The flaps were rotated into position to cover the defect with minimal tension.  The flaps were inset with a combination of buried 3-0 PDS sutures and skin staples.  This gave a nice on table result.  He was then covered with a soft dressing and a stockinette.  The patient tolerated the procedure well.  There were no complications. The patient was allowed to wake from anesthesia, extubated and taken to the recovery room in satisfactory condition.

## 2019-10-04 NOTE — Interval H&P Note (Signed)
History and Physical Interval Note:  10/04/2019 8:50 AM  Joel Ibarra.  has presented today for surgery, with the diagnosis of Squamous cell carcinoma, scalp/neck.  The various methods of treatment have been discussed with the patient and family. After consideration of risks, benefits and other options for treatment, the patient has consented to  Procedure(s): Excision of scalp squamous cell carcinoma and reconstruction with adjacent tissue transfer (N/A) as a surgical intervention.  The patient's history has been reviewed, patient examined, no change in status, stable for surgery.  I have reviewed the patient's chart and labs.  Questions were answered to the patient's satisfaction.     Cindra Presume

## 2019-10-04 NOTE — Brief Op Note (Signed)
10/04/2019  11:06 AM  PATIENT:  Joel Ibarra.  85 y.o. male  PRE-OPERATIVE DIAGNOSIS:  Squamous cell carcinoma, scalp/neck  POST-OPERATIVE DIAGNOSIS:  Squamous cell carcinoma, scalp/neck  PROCEDURE:  Procedure(s): Excision of scalp squamous cell carcinoma and reconstruction with adjacent tissue transfer (N/A)  SURGEON:  Surgeon(s) and Role:    * Ediel Unangst, Steffanie Dunn, MD - Primary  PHYSICIAN ASSISTANT: Software engineer, PA  ASSISTANTS: none   ANESTHESIA:   general  EBL:  20 mL   BLOOD ADMINISTERED:none  DRAINS: none   LOCAL MEDICATIONS USED:  MARCAINE     SPECIMEN:  Source of Specimen:  scalp pericranium  DISPOSITION OF SPECIMEN:  PATHOLOGY  COUNTS:  YES  TOURNIQUET:  * No tourniquets in log *  DICTATION: .Dragon Dictation  PLAN OF CARE: Discharge to home after PACU  PATIENT DISPOSITION:  PACU - hemodynamically stable.   Delay start of Pharmacological VTE agent (>24hrs) due to surgical blood loss or risk of bleeding: not applicable

## 2019-10-04 NOTE — Progress Notes (Signed)
Received call from lab thar Mr. Lohse was +MRSA. Spoke to [employer], Therapist, sports in PACU. Jas noted that she would contact Dr. Claudia Desanctis to see if she would like pt to be treated with Betadine. Per return call from PACU, Cecille Aver, per Dr. Claudia Desanctis no order for Betadine.

## 2019-10-04 NOTE — Anesthesia Postprocedure Evaluation (Signed)
Anesthesia Post Note  Patient: Joel Ibarra.  Procedure(s) Performed: Excision of scalp squamous cell carcinoma and reconstruction with adjacent tissue transfer (N/A Scalp)     Patient location during evaluation: PACU Anesthesia Type: General Level of consciousness: awake and alert and oriented Pain management: pain level controlled Vital Signs Assessment: post-procedure vital signs reviewed and stable Respiratory status: spontaneous breathing, nonlabored ventilation and respiratory function stable Cardiovascular status: blood pressure returned to baseline Postop Assessment: no apparent nausea or vomiting Anesthetic complications: no   No complications documented.  Last Vitals:  Vitals:   10/04/19 1045 10/04/19 1100  BP: 135/67 127/66  Pulse: 60 (!) 57  Resp: 11 15  Temp: (!) 35.7 C (!) 36.2 C  SpO2: 100% 100%    Last Pain:  Vitals:   10/04/19 1100  TempSrc:   PainSc: [place]

## 2019-10-04 NOTE — Progress Notes (Signed)
Post op pain meds

## 2019-10-04 NOTE — Anesthesia Procedure Notes (Signed)
Procedure Name: Intubation Date/Time: 10/04/2019 9:07 AM Performed by: Genelle Bal, CRNA Pre-anesthesia Checklist: Patient identified, Emergency Drugs available, Suction available and Patient being monitored Patient Re-evaluated:Patient Re-evaluated prior to induction Oxygen Delivery Method: Circle system utilized Preoxygenation: Pre-oxygenation with 100% oxygen Induction Type: IV induction Ventilation: Mask ventilation without difficulty Laryngoscope Size: Miller and 2 Grade View: Grade I Tube type: Oral Tube size: 7.5 mm Number of attempts: 1 Airway Equipment and Method: Stylet and Oral airway Placement Confirmation: ETT inserted through vocal cords under direct vision,  positive ETCO2 and breath sounds checked- equal and bilateral Secured at: 21 cm Tube secured with: Tape Dental Injury: Teeth and Oropharynx as per pre-operative assessment

## 2019-10-05 ENCOUNTER — Encounter (HOSPITAL_COMMUNITY): Payer: Self-pay | Admitting: Plastic Surgery

## 2019-10-07 LAB — SURGICAL PATHOLOGY

## 2019-10-10 ENCOUNTER — Other Ambulatory Visit: Payer: Self-pay

## 2019-10-10 ENCOUNTER — Institutional Professional Consult (permissible substitution): Payer: Medicare Other | Admitting: Plastic Surgery

## 2019-10-10 ENCOUNTER — Ambulatory Visit (INDEPENDENT_AMBULATORY_CARE_PROVIDER_SITE_OTHER): Payer: Medicare Other | Admitting: Plastic Surgery

## 2019-10-10 ENCOUNTER — Encounter: Payer: Self-pay | Admitting: Plastic Surgery

## 2019-10-10 VITALS — BP 141/92 | HR 64 | Temp 97.6°F

## 2019-10-10 DIAGNOSIS — C4442 Squamous cell carcinoma of skin of scalp and neck: Secondary | ICD-10-CM

## 2019-10-10 NOTE — Progress Notes (Signed)
Patient is postop from excision of scalp squamous cell carcinoma and closure with adjacent tissue transfer.  Overall he feels great and is doing well.  Pathology showed no residual tumor in the pericranial specimen that I sent which was the deep margin that was the only margin of concern from his Mohs surgeon.  The flaps look to be healing fine.  There is a little bit of skin discoloration at the central portion where there was the most tension.  I explained that we would watch this area and there may be a little bit of separation that develops there but I do not expect it to be at major problem in the end.  We will plan to leave all the stitches and staples in place for now we will see him again in 2 weeks.  All her questions were answered and the patient is here with his daughter.

## 2019-10-23 ENCOUNTER — Encounter: Payer: Self-pay | Admitting: Surgical

## 2019-10-23 ENCOUNTER — Other Ambulatory Visit: Payer: Self-pay

## 2019-10-23 ENCOUNTER — Ambulatory Visit (INDEPENDENT_AMBULATORY_CARE_PROVIDER_SITE_OTHER): Payer: Medicare Other | Admitting: Surgical

## 2019-10-23 VITALS — BP 145/80 | HR 55 | Temp 98.0°F

## 2019-10-23 DIAGNOSIS — C4442 Squamous cell carcinoma of skin of scalp and neck: Secondary | ICD-10-CM

## 2019-10-23 NOTE — Progress Notes (Signed)
Patient is a 85 year old male here for follow-up after excision of scalp squamous cell carcinoma and closure with adjacent tissue transfer with Dr. Claudia Desanctis on 10/04/2019.  At his last visit patient had some discoloration at the central portion where the most tension was noted intraoperatively.  Patient is here with his son today.  Overall he is doing well, nursing staff at the assisted living facility he states that has been helpful with dressing changes.  It appears they have been applying Vaseline daily and covering this with gauze and a Kerlix wrap to secure.  Patient has not had any infectious symptoms, overall feels well and is in good spirits today.  On exam the majority of the incisions are intact with PDS sutures and staples still in place.  He has 1 area where he had some tissue loss centrally, some mild yellowish exudate noted without any purulence or cellulitic changes noted.  No foul odor noted.  The tissue loss is approximately 2 x 0.7 cm with a depth of approximately 0.5 cm.  No tenderness to palpation.  Staples removed, some PDS sutures removed as well.  Left some sutures in place due to the high tension of the closure.  We will plan to remove these in 3 weeks.  They are absorbable so I did discuss with the patient that he may notice some that fall out.  Recommend applying Vaseline daily followed by 4 x 4 gauze and wrapping with either Kerlix to secure or tape.  No sign of infection.  Discussed with patient and son that I would like to see him back in 3 weeks to reevaluate.  Recommend calling with any questions or concerns.  I did provide son with a form to describe dressing changes for nursing staff at his assisted living facility.  Pictures were obtained of the patient and placed in the chart with the patient's or guardian's permission.

## 2019-11-17 ENCOUNTER — Inpatient Hospital Stay (HOSPITAL_COMMUNITY)
Admission: EM | Admit: 2019-11-17 | Discharge: 2019-11-26 | DRG: 200 | Disposition: A | Discharge status: Skilled Nursing Facility | Payer: Medicare Other | Attending: General Surgery | Admitting: General Surgery

## 2019-11-17 ENCOUNTER — Encounter (HOSPITAL_COMMUNITY): Payer: Self-pay | Admitting: *Deleted

## 2019-11-17 ENCOUNTER — Emergency Department (HOSPITAL_COMMUNITY): Payer: Medicare Other

## 2019-11-17 ENCOUNTER — Other Ambulatory Visit: Payer: Self-pay

## 2019-11-17 DIAGNOSIS — G309 Alzheimer's disease, unspecified: Secondary | ICD-10-CM | POA: Diagnosis present

## 2019-11-17 DIAGNOSIS — J942 Hemothorax: Secondary | ICD-10-CM

## 2019-11-17 DIAGNOSIS — W19XXXA Unspecified fall, initial encounter: Secondary | ICD-10-CM | POA: Diagnosis present

## 2019-11-17 DIAGNOSIS — Z87442 Personal history of urinary calculi: Secondary | ICD-10-CM

## 2019-11-17 DIAGNOSIS — Z7982 Long term (current) use of aspirin: Secondary | ICD-10-CM

## 2019-11-17 DIAGNOSIS — Z66 Do not resuscitate: Secondary | ICD-10-CM | POA: Diagnosis present

## 2019-11-17 DIAGNOSIS — Z79899 Other long term (current) drug therapy: Secondary | ICD-10-CM

## 2019-11-17 DIAGNOSIS — J90 Pleural effusion, not elsewhere classified: Secondary | ICD-10-CM | POA: Diagnosis present

## 2019-11-17 DIAGNOSIS — Z91048 Other nonmedicinal substance allergy status: Secondary | ICD-10-CM | POA: Diagnosis not present

## 2019-11-17 DIAGNOSIS — I10 Essential (primary) hypertension: Secondary | ICD-10-CM | POA: Diagnosis present

## 2019-11-17 DIAGNOSIS — S2242XA Multiple fractures of ribs, left side, initial encounter for closed fracture: Secondary | ICD-10-CM | POA: Diagnosis present

## 2019-11-17 DIAGNOSIS — J95811 Postprocedural pneumothorax: Secondary | ICD-10-CM

## 2019-11-17 DIAGNOSIS — Z85828 Personal history of other malignant neoplasm of skin: Secondary | ICD-10-CM | POA: Diagnosis not present

## 2019-11-17 DIAGNOSIS — I739 Peripheral vascular disease, unspecified: Secondary | ICD-10-CM | POA: Diagnosis present

## 2019-11-17 DIAGNOSIS — I700 Atherosclerosis of aorta: Secondary | ICD-10-CM | POA: Diagnosis present

## 2019-11-17 DIAGNOSIS — D62 Acute posthemorrhagic anemia: Secondary | ICD-10-CM | POA: Diagnosis present

## 2019-11-17 DIAGNOSIS — E039 Hypothyroidism, unspecified: Secondary | ICD-10-CM | POA: Diagnosis present

## 2019-11-17 DIAGNOSIS — J439 Emphysema, unspecified: Secondary | ICD-10-CM | POA: Diagnosis present

## 2019-11-17 DIAGNOSIS — Z881 Allergy status to other antibiotic agents status: Secondary | ICD-10-CM | POA: Diagnosis not present

## 2019-11-17 DIAGNOSIS — S271XXA Traumatic hemothorax, initial encounter: Principal | ICD-10-CM | POA: Diagnosis present

## 2019-11-17 DIAGNOSIS — Z91030 Bee allergy status: Secondary | ICD-10-CM | POA: Diagnosis not present

## 2019-11-17 DIAGNOSIS — Z9689 Presence of other specified functional implants: Secondary | ICD-10-CM

## 2019-11-17 DIAGNOSIS — Z20822 Contact with and (suspected) exposure to covid-19: Secondary | ICD-10-CM | POA: Diagnosis present

## 2019-11-17 DIAGNOSIS — J939 Pneumothorax, unspecified: Secondary | ICD-10-CM

## 2019-11-17 DIAGNOSIS — Z87891 Personal history of nicotine dependence: Secondary | ICD-10-CM

## 2019-11-17 DIAGNOSIS — N179 Acute kidney failure, unspecified: Secondary | ICD-10-CM | POA: Diagnosis present

## 2019-11-17 DIAGNOSIS — F0280 Dementia in other diseases classified elsewhere without behavioral disturbance: Secondary | ICD-10-CM | POA: Diagnosis present

## 2019-11-17 MED ORDER — FENTANYL CITRATE (PF) 100 MCG/2ML IJ SOLN
INTRAMUSCULAR | Status: AC
  Filled 2019-11-17: qty 2

## 2019-11-17 MED ORDER — METHOCARBAMOL 1000 MG/10ML IJ SOLN: 500.0000 mg | Freq: Three times a day (TID) | INTRAVENOUS | Status: DC | PRN

## 2019-11-17 MED ORDER — MEMANTINE HCL 10 MG PO TABS
5.0000 mg | ORAL_TABLET | Freq: Every day | ORAL | Status: DC
  Administered 2019-11-17 – 2019-11-26 (×10): 5 mg via ORAL
  Filled 2019-11-17 (×10): qty 1

## 2019-11-17 MED ORDER — MORPHINE SULFATE (PF) 2 MG/ML IV SOLN: 1.0000 mg | Freq: Once | INTRAVENOUS | Status: AC

## 2019-11-17 MED ORDER — ONDANSETRON 4 MG PO TBDP: 4.0000 mg | ORAL_TABLET | Freq: Four times a day (QID) | ORAL | Status: DC | PRN

## 2019-11-17 MED ORDER — POTASSIUM CHLORIDE IN NACL 20-0.9 MEQ/L-% IV SOLN
INTRAVENOUS | Status: DC
  Filled 2019-11-17 (×4): qty 1000

## 2019-11-17 MED ORDER — ACETAMINOPHEN 325 MG PO TABS
650.0000 mg | ORAL_TABLET | Freq: Four times a day (QID) | ORAL | Status: DC
  Administered 2019-11-17 – 2019-11-18 (×5): 650 mg via ORAL
  Filled 2019-11-17 (×4): qty 2

## 2019-11-17 MED ORDER — FENTANYL CITRATE (PF) 100 MCG/2ML IJ SOLN
12.5000 ug | INTRAMUSCULAR | Status: DC | PRN
  Administered 2019-11-17 – 2019-11-19 (×5): 25 ug via INTRAVENOUS
  Administered 2019-11-19: 12.5 ug via INTRAVENOUS
  Administered 2019-11-21: 25 ug via INTRAVENOUS
  Filled 2019-11-17 (×6): qty 2

## 2019-11-17 MED ORDER — METOPROLOL SUCCINATE ER 50 MG PO TB24
100.0000 mg | ORAL_TABLET | Freq: Every day | ORAL | Status: DC
  Administered 2019-11-18 – 2019-11-26 (×8): 100 mg via ORAL
  Filled 2019-11-17: qty 2
  Filled 2019-11-17: qty 1
  Filled 2019-11-17 (×8): qty 2

## 2019-11-17 MED ORDER — ACETAMINOPHEN 325 MG PO TABS
ORAL_TABLET | ORAL | Status: AC
  Filled 2019-11-17: qty 2

## 2019-11-17 MED ORDER — ONDANSETRON HCL 4 MG/2ML IJ SOLN: 4.0000 mg | Freq: Four times a day (QID) | INTRAMUSCULAR | Status: DC | PRN

## 2019-11-17 MED ORDER — DONEPEZIL HCL 5 MG PO TABS
5.0000 mg | ORAL_TABLET | Freq: Every day | ORAL | Status: DC
  Administered 2019-11-17 – 2019-11-26 (×10): 5 mg via ORAL
  Filled 2019-11-17 (×11): qty 1

## 2019-11-17 MED ORDER — LIDOCAINE 5 % EX PTCH
1.0000 | MEDICATED_PATCH | CUTANEOUS | Status: DC
  Administered 2019-11-17 – 2019-11-26 (×10): 1 via TRANSDERMAL
  Filled 2019-11-17 (×10): qty 1

## 2019-11-17 MED ORDER — DOCUSATE SODIUM 100 MG PO CAPS
100.0000 mg | ORAL_CAPSULE | Freq: Two times a day (BID) | ORAL | Status: DC
  Administered 2019-11-17 – 2019-11-26 (×18): 100 mg via ORAL
  Filled 2019-11-17 (×18): qty 1

## 2019-11-17 MED ORDER — ENOXAPARIN SODIUM 40 MG/0.4ML ~~LOC~~ SOLN
40.0000 mg | SUBCUTANEOUS | Status: DC
  Administered 2019-11-18: 40 mg via SUBCUTANEOUS
  Filled 2019-11-17: qty 0.4

## 2019-11-17 MED ORDER — SERTRALINE HCL 100 MG PO TABS
100.0000 mg | ORAL_TABLET | Freq: Every day | ORAL | Status: DC
  Administered 2019-11-17 – 2019-11-26 (×10): 100 mg via ORAL
  Filled 2019-11-17 (×10): qty 1

## 2019-11-17 MED ORDER — MORPHINE SULFATE (PF) 2 MG/ML IV SOLN
INTRAVENOUS | Status: AC
  Administered 2019-11-17: 1 mg via INTRAVENOUS
  Filled 2019-11-17: qty 1

## 2019-11-17 MED ORDER — OXYCODONE HCL 5 MG PO TABS
2.5000 mg | ORAL_TABLET | ORAL | Status: DC | PRN
  Administered 2019-11-17 – 2019-11-18 (×5): 2.5 mg via ORAL
  Filled 2019-11-17 (×5): qty 1

## 2019-11-17 NOTE — ED Notes (Signed)
Daughter at bedside- updated on procedure and questions answered

## 2019-11-17 NOTE — ED Provider Notes (Addendum)
[facility] EMERGENCY DEPARTMENT Provider Note   CSN: [id number] Arrival date & time: 11/17/19  7124     History No chief complaint on file.   Joel Ibarra. is a 85 y.o. male.  Patient presents to the emergency department as a transfer from [facility] emergency department after evaluation for a left sided chest trauma.  Patient found to have multiple rib fractures with hemothorax.  He arrives via [facility].  Patient with 5 out of 10 pain but breathing comfortably.  Normotensive.  Oxygenation is normal on 3 L.        Past Medical History:  Diagnosis Date  . Cancer ([place])   . Dementia ([place])   . History of kidney stones     numerous- Lithrotrispy  . Hypertension   . Hypothyroidism   . Kidney stones   . Memory loss     Patient Active Problem List   Diagnosis Date Noted  . Osteomyelitis of third toe of left foot ([place]) 08/23/2018  . Hyperglycemia 01/31/2018  . Acute kidney injury superimposed on chronic kidney disease ([place]) 01/31/2018  . Thrombocytopenia ([place]) 01/31/2018  . Osteomyelitis of second toe of left foot ([place])   . Abscess, toe, left   . Cellulitis of left foot 01/29/2018  . Essential hypertension 01/29/2018  . Dementia ([place]) 01/29/2018  . Cellulitis 01/29/2018  . Diarrhea 06/15/2017  . Nausea & vomiting 06/15/2017  . Tachycardia 06/15/2017    Past Surgical History:  Procedure Laterality Date  . ADJACENT TISSUE TRANSFER/TISSUE REARRANGEMENT N/A 10/04/2019   Procedure: Excision of scalp squamous cell carcinoma and reconstruction with adjacent tissue transfer;  Surgeon: Cindra Presume, MD;  Location: [facility];  Service: Plastics;  Laterality: N/A;  . AMPUTATION Left 01/31/2018   Procedure: LEFT FOOT SECOND TOE AMPUTATION;  Surgeon: Newt Minion, MD;  Location: [place];  Service: Orthopedics;  Laterality: Left;  . AMPUTATION Left 08/29/2018   Procedure: LEFT 3RD TOE AMPUTATION;  Surgeon: Newt Minion, MD;  Location: [place];   Service: Orthopedics;  Laterality: Left;  . CHOLECYSTECTOMY    . EYE SURGERY Bilateral    catract  . left first toe AMPUTATION    . NISSEN FUNDOPLICATION         Family History  Problem Relation Age of Onset  . Memory loss Mother   . Memory loss Father     Social History   Tobacco Use  . Smoking status: Former Smoker    Years: 5.00    Quit date: 1970    Years since quitting: 51.7  . Smokeless tobacco: Never Used  Vaping Use  . Vaping Use: Never used  Substance Use Topics  . Alcohol use: Yes    Alcohol/week: 7.0 standard drinks    Types: 7 Standard drinks or equivalent per week  . Drug use: Never    Home Medications Prior to Admission medications   Medication Sig Start Date End Date Taking? Authorizing Provider  Aspirin Buf,CaCarb-MgCarb-MgO, 81 MG TABS Take 81 mg by mouth daily.  10/17/07   [provider]  donepezil (ARICEPT) 5 MG tablet Take 5 mg by mouth daily. 09/30/19   [provider]  memantine (NAMENDA) 5 MG tablet Take 5 mg by mouth daily.    [provider]  metoprolol succinate (TOPROL-XL) 100 MG 24 hr tablet Take 100 mg by mouth daily. 07/09/07   [provider]  sertraline (ZOLOFT) 100 MG tablet Take 100 mg by mouth daily. 11/01/16  [provider]    Allergies    Bee venom, Codeine, Daptomycin, Clindamycin/lincomycin, and Lactose  Review of Systems   Review of Systems  Musculoskeletal:       Left rib pain  All other systems reviewed and are negative.   Physical Exam Updated Vital Signs BP 109/62 (BP Location: Right Arm)   Pulse (!) 58   Temp (!) 97 F (36.1 C) (Oral)   Resp (!) 22   SpO2 96%   Physical Exam Vitals and nursing note reviewed.  Constitutional:      General: He is not in acute distress.    Appearance: Normal appearance. He is well-developed.  HENT:     Head: Normocephalic and atraumatic.     Right Ear: Hearing normal.     Left Ear: Hearing normal.     Nose: Nose normal.  Eyes:      Conjunctiva/sclera: Conjunctivae normal.     Pupils: Pupils are equal, round, and reactive to light.  Cardiovascular:     Rate and Rhythm: Regular rhythm.     Heart sounds: S1 normal and S2 normal. No murmur heard.  No friction rub. No gallop.   Pulmonary:     Effort: Pulmonary effort is normal. No respiratory distress.     Breath sounds: Examination of the left-middle field reveals decreased breath sounds. Examination of the left-lower field reveals decreased breath sounds. Decreased breath sounds present.  Chest:     Chest wall: Tenderness present.    Abdominal:     General: Bowel sounds are normal.     Palpations: Abdomen is soft.     Tenderness: There is no abdominal tenderness. There is no guarding or rebound. Negative signs include Murphy's sign and McBurney's sign.     Hernia: No hernia is present.  Musculoskeletal:        General: Normal range of motion.     Cervical back: Normal range of motion and neck supple.  Skin:    General: Skin is warm and dry.     Findings: No rash.  Neurological:     Mental Status: He is alert and oriented to person, place, and time.     GCS: GCS eye subscore is 4. GCS verbal subscore is 5. GCS motor subscore is 6.     Cranial Nerves: No cranial nerve deficit.     Sensory: No sensory deficit.     Coordination: Coordination normal.  Psychiatric:        Speech: Speech normal.        Behavior: Behavior normal.        Thought Content: Thought content normal.     ED Results / Procedures / Treatments   Labs (all labs ordered are listed, but only abnormal results are displayed) Labs Reviewed - No data to display  EKG None  Radiology No results found.  Procedures Procedures (including critical care time)  Medications Ordered in ED Medications - No data to display  ED Course  I have reviewed the triage vital signs and the nursing notes.  Pertinent labs & imaging results that were available during my care of the patient were reviewed  by me and considered in my medical decision making (see chart for details).    MDM Rules/Calculators/A&P                          Patient received via [facility] from [facility].  Patient with mild to moderate pain at arrival but  appears comfortable.  Vital signs are normal.  He has a small oxygen demand.  Dr. Redmond Pulling, trauma surgery will evaluate the patient for further needs.  Final Clinical Impression(s) / ED Diagnoses Final diagnoses:  Closed fracture of multiple ribs of left side, initial encounter  Hemothorax on left    Rx / DC Orders ED Discharge Orders    None       Khiana Camino, Gwenyth Allegra, MD 11/17/19 2341    Orpah Greek, MD 11/17/19 [phone]

## 2019-11-17 NOTE — ED Triage Notes (Signed)
Patient arrives via [facility] from [facility]. Pt fell, pt 109/50's, hr 58. IV x2 established. CTA showed rib fractures and hemothorax. 3liters oxygen.

## 2019-11-17 NOTE — H&P (Signed)
CC: I hurt  Requesting provider: Dr Betsey Holiday  HPI: Joel Ibarra. is an 85 y.o. male who is here for evaluation and management of a large left hemothorax and rib fractures after sustaining a fall.  Patient has Alzheimer's and lives in a memory care unit.  He reportedly fell on Friday.  He was evaluated at the memory care unit and there was no apparent injuries.  On Saturday afternoon the staff noticed that he had some blue nailbeds, was complaining of pain, clammy, diaphoretic and he was taken to [facility].  He was evaluated.  CT of the chest revealed 2 rib fractures 1 of which was in multiple locations as well as a large left pleural effusion consistent with a hemothorax.  He did require 4 L of oxygen.  The daughter requested transfer to [facility] for management.  He was found also to have a mild acute kidney injury with a creatinine of 1.68.  His Covid test was negative.  Comprehensive metabolic panel was normal with the exception of the creatinine.  CBC showed a hemoglobin of 11, hematocrit 33, white count 10.6, platelet count 192.  Patient is unable to provide any history due to his Alzheimer's  Past medical history includes scalp squamous cell carcinoma, Alzheimer's, hypertension  Past Medical History:  Diagnosis Date  . Cancer ([place])   . Dementia ([place])   . History of kidney stones     numerous- Lithrotrispy  . Hypertension   . Hypothyroidism   . Kidney stones   . Memory loss     Past Surgical History:  Procedure Laterality Date  . ADJACENT TISSUE TRANSFER/TISSUE REARRANGEMENT N/A 10/04/2019   Procedure: Excision of scalp squamous cell carcinoma and reconstruction with adjacent tissue transfer;  Surgeon: Cindra Presume, MD;  Location: [facility];  Service: Plastics;  Laterality: N/A;  . AMPUTATION Left 01/31/2018   Procedure: LEFT FOOT SECOND TOE AMPUTATION;  Surgeon: Newt Minion, MD;  Location: [place];  Service: Orthopedics;  Laterality: Left;  .  AMPUTATION Left 08/29/2018   Procedure: LEFT 3RD TOE AMPUTATION;  Surgeon: Newt Minion, MD;  Location: [place];  Service: Orthopedics;  Laterality: Left;  . CHOLECYSTECTOMY    . EYE SURGERY Bilateral    catract  . left first toe AMPUTATION    . NISSEN FUNDOPLICATION      Family History  Problem Relation Age of Onset  . Memory loss Mother   . Memory loss Father     Social:  reports that he quit smoking about 51 years ago. He quit after 5.00 years of use. He has never used smokeless tobacco. He reports current alcohol use of about 7.0 standard drinks of alcohol per week. He reports that he does not use drugs.  Allergies:  Allergies  Allergen Reactions  . Bee Venom Swelling  . Codeine Nausea And Vomiting, Nausea Only, Swelling and Other (See Comments)  . Daptomycin Other (See Comments)    Pneumonitis  . Clindamycin/Lincomycin Rash  . Lactose Diarrhea and Other (See Comments)    Cramps      Medications: I have reviewed the patient's current medications.  No results found for this or any previous visit (from the past 48 hour(s)).  DG Chest Portable 1 View  Result Date: 11/17/2019 CLINICAL DATA:  With chest tube. EXAM: PORTABLE CHEST 1 VIEW COMPARISON:  November 17, 2019 FINDINGS: The cardiomediastinal silhouette is enlarged in contour.LEFT chest tube placement. Decreased moderate LEFT pleural effusion. No significant pneumothorax.  Improved aeration of the LEFT lung with persistent interstitial prominence on the LEFT basilar homogeneous opacification. Surgical clips project over the upper abdomen. Multiple displaced LEFT-sided rib fractures. IMPRESSION: 1. LEFT chest tube placement with decreased moderate LEFT pleural effusion. No significant pneumothorax. 2. Improved aeration of the LEFT lung with persistent interstitial prominence and LEFT basilar homogeneous opacification. Electronically Signed   By: Valentino Saxon MD   On: 11/17/2019 08:02   DG Chest Portable 1 View  Result  Date: 11/17/2019 CLINICAL DATA:  Status post fall.  Hemothorax. EXAM: PORTABLE CHEST 1 VIEW COMPARISON:  CT angio chest 11/17/2019 FINDINGS: Large left pleural fluid collection with significantly diminished aeration to the left mid and left lower lung is unchanged from previous exam. Right lung appears relatively clear. Again seen are multiple acute left rib fractures. IMPRESSION: 1. No change in large left pleural fluid collection with opacification of the left mid and lower lung. 2. Multiple acute left rib fractures. Electronically Signed   By: Kerby Moors M.D.   On: 11/17/2019 05:38    ROS - unable to assess due to alzheimer's  PE Blood pressure (!) 115/56, pulse (!) 59, temperature (!) 97 F (36.1 C), temperature source Oral, resp. rate (!) 23, SpO2 97 %. Constitutional: NAD; conversant; no deformities, intermittently in discomfort on L lateral lower chest Eyes: Moist conjunctiva; no lid lag; anicteric; PERRL Neck: Trachea midline; no thyromegaly Chest: no external signs of trauma, TTP over L lower rib cage Lungs: Normal respiratory effort; no tactile fremitus CV: RRR; no palpable thrills; no pitting edema GI: Abd soft, nontender, nondistended; no palpable hepatosplenomegaly MSK: unable to assess gait; no clubbing/cyanosis Psychiatric: Appropriate affect; alert and awake, pleasantly confused. Not oriented Lymphatic: No palpable cervical or axillary lymphadenopathy Skin:no rash/edema/lesions  No results found for this or any previous visit (from the past 70 hour(s)).  CTA CHEST [facility] 11/17/2019 ADDENDUM REPORT: 11/17/2019 02:40   ADDENDUM:  There are acute fractures of the left eighth and ninth ribs  posterolaterally as well as an additional fracture of the left ninth  rib just beyond the costovertebral junction. There is high density  material within the left pleural effusion almost certainly  representing blood clot in the setting of a hemothorax.     Electronically Signed  By: Fidela Salisbury MD  On: 11/17/2019 02:40   CLINICAL DATA: Hypoxia, pleuritic chest pain   EXAM:  CT ANGIOGRAPHY CHEST WITH CONTRAST   TECHNIQUE:  Multidetector CT imaging of the chest was performed using the  standard protocol during bolus administration of intravenous  contrast. Multiplanar CT image reconstructions and MIPs were  obtained to evaluate the vascular anatomy.   CONTRAST: 50 cc Omnipaque 350   COMPARISON: None.   FINDINGS:  Cardiovascular: There is excellent opacification of the central  pulmonary arteries. No intraluminal filling defect to suggest acute  pulmonary embolism. The central pulmonary arteries are of normal  caliber. No significant coronary artery calcification. Global  cardiac size within normal limits. No pericardial effusion. Thoracic  aorta is unremarkable save for minimal atherosclerotic calcification  in its descending segment.. There is extensive atherosclerotic  calcification within the left carotid artery and its patency is not  confirmed on this examination though the systemic arterial system is  not well opacified.   Mediastinum/Nodes: No pathologic thoracic adenopathy. Thyroid  unremarkable. Esophagus unremarkable.   Lungs/Pleura: Large left pleural effusion is present with  compressive atelectasis of the left upper lobe and near complete  collapse of the left  lower lobe. No definite central obstructing  mass identified. The right lung is clear save for minimal apical  paraseptal emphysema. No pneumothorax.   Upper Abdomen: Cholecystectomy has been performed. There is a  unusual appearance of the caudate lobe of the liver which likely  represents a anatomic variant.   Musculoskeletal: Osseous structures are age-appropriate. No lytic or  blastic bone lesions are seen.   Review of the MIP images confirms the above findings.   IMPRESSION:  Large left pleural effusion with subtotal collapse of  the left lower  lobe and compressive atelectasis at the left upper lobe.   No pulmonary embolism.   Peripheral vascular disease with probable chronic occlusion of the  left carotid artery. If indicated, this could be confirmed with  dedicated carotid Doppler sonography.   Aortic Atherosclerosis (ICD10-I70.0) and Emphysema (ICD10-J43.9).      DG Chest Portable 1 View  Result Date: 11/17/2019 CLINICAL DATA:  With chest tube. EXAM: PORTABLE CHEST 1 VIEW COMPARISON:  November 17, 2019 FINDINGS: The cardiomediastinal silhouette is enlarged in contour.LEFT chest tube placement. Decreased moderate LEFT pleural effusion. No significant pneumothorax. Improved aeration of the LEFT lung with persistent interstitial prominence on the LEFT basilar homogeneous opacification. Surgical clips project over the upper abdomen. Multiple displaced LEFT-sided rib fractures. IMPRESSION: 1. LEFT chest tube placement with decreased moderate LEFT pleural effusion. No significant pneumothorax. 2. Improved aeration of the LEFT lung with persistent interstitial prominence and LEFT basilar homogeneous opacification. Electronically Signed   By: Valentino Saxon MD   On: 11/17/2019 08:02   DG Chest Portable 1 View  Result Date: 11/17/2019 CLINICAL DATA:  Status post fall.  Hemothorax. EXAM: PORTABLE CHEST 1 VIEW COMPARISON:  CT angio chest 11/17/2019 FINDINGS: Large left pleural fluid collection with significantly diminished aeration to the left mid and left lower lung is unchanged from previous exam. Right lung appears relatively clear. Again seen are multiple acute left rib fractures. IMPRESSION: 1. No change in large left pleural fluid collection with opacification of the left mid and lower lung. 2. Multiple acute left rib fractures. Electronically Signed   By: Kerby Moors M.D.   On: 11/17/2019 05:38    Imaging: Reviewed  A/P: Joel Ibarra. is an 85 y.o. male  Status post fall Left eighth ninth rib  fractures with hemothorax Alzheimer's Acute kidney injury Acute blood loss anemia  Admit to inpatient He will need a chest tube to evacuate the hemothorax He is 1-1/2 to 2 days out from his event I do not see a need to do any additional imaging at this point Pain control, pulmonary toilet PT OT Gentle IV fluid hydration Repeat labs in morning Start chemical DVT prophylaxis in the a.m.  Confirmed DNR status with daughter  [name], MD, FACS General, Bariatric, & Minimally Invasive Surgery [facility], [place]

## 2019-11-17 NOTE — Progress Notes (Signed)
Insertion of Chest Tube Procedure Note  Joel Ibarra  [id number]  06-04-1934  Date:11/17/19  Time:6:55 AM    Provider Performing: Greer Pickerel   Procedure: Chest Tube Insertion [id number])  Indication(s) Hemothorax  Consent Risks of the procedure as well as the alternatives and risks of each were explained to the patient and/or caregiver.  Consent for the procedure was obtained and is signed in the bedside chart  Anesthesia Topical only with 1% lidocaine    Time Out Verified patient identification, verified procedure, site/side was marked, verified correct patient position, special equipment/implants available, medications/allergies/relevant history reviewed, required imaging and test results available.   Sterile Technique Maximal sterile technique including full sterile barrier drape, hand hygiene,  sterile gloves, mask,    Procedure Description Ultrasound not used to identify appropriate pleural anatomy for placement and overlying skin marked. Area of placement cleaned and draped in sterile fashion.  A 14 French pigtail pleural catheter was placed into the left pleural (mid axillary line at level of nipple) space using Seldinger technique. Appropriate return of blood was obtained.  The tube was connected to [facility] and placed on -20 cm H2O wall suction.  no air leak  Complications/Tolerance None; patient tolerated the procedure well. Chest X-ray is ordered to verify placement.   EBL 900 cc old blood  Specimen(s) none   Joel Ibarra. Redmond Pulling, MD, FACS General, Bariatric, & Minimally Invasive Surgery [facility], [place]

## 2019-11-17 NOTE — ED Notes (Signed)
X-ray at bedside

## 2019-11-18 ENCOUNTER — Inpatient Hospital Stay (HOSPITAL_COMMUNITY): Payer: Medicare Other

## 2019-11-18 LAB — BASIC METABOLIC PANEL
Anion gap: 7 (ref 5–15)
BUN: 24 mg/dL — ABNORMAL HIGH (ref 8–23)
CO2: 25 mmol/L (ref 22–32)
Calcium: 8.5 mg/dL — ABNORMAL LOW (ref 8.9–10.3)
Chloride: 108 mmol/L (ref 98–111)
Creatinine, Ser: 1.15 mg/dL (ref 0.61–1.24)
GFR calc Af Amer: >60 mL/min (ref >60)
GFR calc non Af Amer: 59 mL/min — ABNORMAL LOW (ref >60)
Glucose, Bld: 107 mg/dL — ABNORMAL HIGH (ref 70–99)
Potassium: 4.1 mmol/L (ref 3.5–5.1)
Sodium: 140 mmol/L (ref 135–145)

## 2019-11-18 LAB — CBC
HCT: 28.1 % — ABNORMAL LOW (ref 39.0–52.0)
HCT: 29.5 % — ABNORMAL LOW (ref 39.0–52.0)
Hemoglobin: 9 g/dL — ABNORMAL LOW (ref 13.0–17.0)
Hemoglobin: 9.2 g/dL — ABNORMAL LOW (ref 13.0–17.0)
MCH: 31.9 pg (ref 26.0–34.0)
MCH: 31.3 pg (ref 26.0–34.0)
MCHC: 32 g/dL (ref 30.0–36.0)
MCHC: 31.2 g/dL (ref 30.0–36.0)
MCV: 99.6 fL (ref 80.0–100.0)
MCV: 100.3 fL — ABNORMAL HIGH (ref 80.0–100.0)
Platelets: 138 10*3/uL — ABNORMAL LOW (ref 150–400)
Platelets: 150 10*3/uL (ref 150–400)
RBC: 2.82 MIL/uL — ABNORMAL LOW (ref 4.22–5.81)
RBC: 2.94 MIL/uL — ABNORMAL LOW (ref 4.22–5.81)
RDW: 13.4 % (ref 11.5–15.5)
RDW: 13.3 % (ref 11.5–15.5)
WBC: 5.7 10*3/uL (ref 4.0–10.5)
WBC: 6.2 10*3/uL (ref 4.0–10.5)
nRBC: 0 % (ref 0.0–0.2)
nRBC: 0 % (ref 0.0–0.2)

## 2019-11-18 LAB — MRSA PCR SCREENING: MRSA by PCR: POSITIVE — AB

## 2019-11-18 MED ORDER — MELATONIN 5 MG PO TABS
5.0000 mg | ORAL_TABLET | Freq: Every evening | ORAL | Status: DC | PRN
  Administered 2019-11-19 – 2019-11-25 (×5): 5 mg via ORAL
  Filled 2019-11-18 (×5): qty 1

## 2019-11-18 MED ORDER — METHOCARBAMOL 1000 MG/10ML IJ SOLN
500.0000 mg | Freq: Three times a day (TID) | INTRAVENOUS | Status: DC
  Administered 2019-11-18 – 2019-11-20 (×6): 500 mg via INTRAVENOUS
  Filled 2019-11-18 (×8): qty 5

## 2019-11-18 MED ORDER — MUPIROCIN 2 % EX OINT
1.0000 "application " | TOPICAL_OINTMENT | Freq: Two times a day (BID) | CUTANEOUS | Status: AC
  Administered 2019-11-18 – 2019-11-22 (×10): 1 via NASAL
  Filled 2019-11-18 (×3): qty 22

## 2019-11-18 MED ORDER — ACETAMINOPHEN 325 MG PO TABS
650.0000 mg | ORAL_TABLET | Freq: Four times a day (QID) | ORAL | Status: DC
  Administered 2019-11-18 – 2019-11-26 (×29): 650 mg via ORAL
  Filled 2019-11-18 (×28): qty 2

## 2019-11-18 MED ORDER — CHLORHEXIDINE GLUCONATE CLOTH 2 % EX PADS
6.0000 | MEDICATED_PAD | Freq: Every day | CUTANEOUS | Status: AC
  Administered 2019-11-18 – 2019-11-22 (×4): 6 via TOPICAL

## 2019-11-18 NOTE — Progress Notes (Signed)
At the daughter's request upon admission, she was notified that the pt woke up very irritable and combative this AM. The chest Xray was done with the assistance of 2 nurses and the Xray technician. RN will continue to monitor and administer prn meds for pain as ordered.

## 2019-11-18 NOTE — Plan of Care (Signed)
  Problem: Pain Managment: Goal: General experience of comfort will improve Outcome: Progressing   Problem: Safety: Goal: Ability to remain free from injury will improve Outcome: Progressing   Problem: Skin Integrity: Goal: Risk for impaired skin integrity will decrease Outcome: Progressing   

## 2019-11-18 NOTE — Evaluation (Signed)
Occupational Therapy Evaluation Patient Details Name: Joel Ibarra. MRN: [id number] DOB: [birth year] Today's Date: 11/18/2019    History of Present Illness Joel Ibarra. is an 85 y.o. male who is here for evaluation and management of a large left hemothorax and rib fractures after sustaining a fall.  Patient has Alzheimer's and lives in a memory care unit.  He reportedly fell on Friday.  He was evaluated at the memory care unit and there was no apparent injuries.  On Saturday afternoon the staff noticed that he had some blue nailbeds, was complaining of pain, clammy, diaphoretic and he was taken to [facility].  He was evaluated.  CT of the chest revealed 2 rib fractures 1 of which was in multiple locations as well as a large left pleural effusion consistent with a hemothorax.  He did require 4 L of oxygen and placement of chest tube.  The daughter requested transfer to [facility] for management.  He was found also to have a mild acute kidney injury    Clinical Impression   PTA, pt resides at [facility] memory care unit. Per pt and daughter, pt receives light assist for ADLs and typically mobile with RW. Pt presents now s/p fall and on 4 L O2 with deficits in strength, cardiopulmonary tolerance, endurance, dynamic standing balance and pain. Pt pleasant and motivated to participate. Pt overall Min A for short mobility with RW alongside bed with frequent cueing for safe sequencing. Pt requires grossly Min A for LB ADLs due to deficits. Educated pt/daughter on compensatory strategies and safety techniques for daily tasks while healing. Recommend pt to DC to [facility] facility prior to return to memory care unit.     Follow Up Recommendations  SNF;Supervision/Assistance - 24 hour (ST rehab at [facility])    Equipment Recommendations  None recommended by OT    Recommendations for Other Services       Precautions / Restrictions  Precautions Precautions: Fall;Other (comment) Precaution Comments: chest tube Restrictions Weight Bearing Restrictions: No      Mobility Bed Mobility               General bed mobility comments: up in recliner  Transfers Overall transfer level: Needs assistance Equipment used: Rolling walker (2 wheeled) Transfers: Sit to/from [facility] Sit to Stand: Min guard Stand pivot transfers: Min assist       General transfer comment: Min guard for power up after multimodal cues for pushing up from armrest. Min A for dynamic balance and turning with RW to maintain balance    Balance Overall balance assessment: Needs assistance;History of Falls Sitting-balance support: No upper extremity supported;Feet supported Sitting balance-Leahy Scale: Fair Sitting balance - Comments: limited for challenges due to rib pain   Standing balance support: Bilateral upper extremity supported;During functional activity Standing balance-Leahy Scale: Poor Standing balance comment: reliant on B UE support or external support from therapist                           ADL either performed or assessed with clinical judgement   ADL Overall ADL's : Needs assistance/impaired Eating/Feeding: Set up;Sitting   Grooming: Minimal assistance;Standing   Upper Body Bathing: Minimal assistance;Sitting   Lower Body Bathing: Minimal assistance;Sit to/from stand   Upper Body Dressing : Minimal assistance;Sitting   Lower Body Dressing: Minimal assistance;Sitting/lateral leans;Sit to/from stand Lower Body Dressing Details (indicate cue type and reason): Pt able to  demo ability to cross B LE to reach feet for LB dressing. Educated on completing dressing this way rather than bending forward Toilet Transfer: Minimal assistance;Stand-pivot;BSC;RW Armed forces technical officer Details (indicate cue type and reason): simulated in room with RW, unable to fully mobilize to bathroom due to increasing  unsteadiness, but fully capable of stand pivots with RW, MIn A and cues for sequencing Toileting- Clothing Manipulation and Hygiene: Minimal assistance;Sit to/from stand;Sitting/lateral lean       Functional mobility during ADLs: Minimal assistance;Cueing for safety;Cueing for sequencing;Rolling walker       Vision Patient Visual Report: No change from baseline Vision Assessment?: No apparent visual deficits     Perception     Praxis      Pertinent Vitals/Pain Pain Assessment: Faces Faces Pain Scale: Hurts even more Pain Location: L ribs/chest tube site Pain Descriptors / Indicators: Aching;Discomfort;Grimacing;Guarding Pain Intervention(s): Limited activity within patient's tolerance;Monitored during session;Repositioned     Hand Dominance Right   Extremity/Trunk Assessment Upper Extremity Assessment Upper Extremity Assessment: LUE deficits/detail LUE Deficits / Details: limited shoulder flex to 90-100* before pain occurred from rib fx, did complete MMT on shoulder  LUE: Unable to fully assess due to pain LUE Sensation: WNL LUE Coordination: WNL   Lower Extremity Assessment Lower Extremity Assessment: Defer to PT evaluation   Cervical / Trunk Assessment Cervical / Trunk Assessment: Normal   Communication Communication Communication: No difficulties   Cognition Arousal/Alertness: Awake/alert Behavior During Therapy: WFL for tasks assessed/performed Overall Cognitive Status: History of cognitive impairments - at baseline Area of Impairment: Orientation;Memory;Following commands;Safety/judgement;Awareness;Problem solving                 Orientation Level: Disoriented to;Place;Time;Situation   Memory: Decreased recall of precautions;Decreased short-term memory Following Commands: Follows one step commands with increased time Safety/Judgement: Decreased awareness of safety;Decreased awareness of deficits Awareness: Emergent Problem Solving: Slow  processing;Decreased initiation;Difficulty sequencing;Requires verbal cues;Requires tactile cues General Comments: Pt with hx of dementia, able to state name but unable to recall birthday. Able to follow one step commands consistently with increased time, multimodal cues   General Comments  Pt on 4 L O2 on entry, assessed SpO2 after short mobility with no SOB noted (70% but unsure of accuracy due to cold digits). Pt's daughter present throughout session and actively engaged    Exercises     Shoulder Instructions      Home Living Family/patient expects to be discharged to:: [place]: Other (Comment) (staff) Available Help at Discharge: [place];Available 24 hours/day                         Home Equipment: Walker - 2 wheels          Prior Functioning/Environment Level of Independence: Needs assistance  Gait / Transfers Assistance Needed: Pt typically uses a RW for mobility, was not using when he fell. Pt with hx of L greater toe amputation creating balance deficits ADL's / Homemaking Assistance Needed: Pt requires light assist for ADLs from staff as needed            OT Problem List: Decreased strength;Decreased activity tolerance;Impaired balance (sitting and/or standing);Decreased cognition;Decreased safety awareness;Decreased knowledge of use of DME or AE;Cardiopulmonary status limiting activity;Pain      OT Treatment/Interventions: Self-care/ADL training;Therapeutic exercise;Energy conservation;DME and/or AE instruction;Therapeutic activities;Patient/family education    OT Goals(Current goals can be found in the care plan section) Acute Rehab OT Goals Patient Stated Goal: pain control, improve  mobility, wean off oxygen OT Goal Formulation: With patient/family Time For Goal Achievement: 12/02/19 Potential to Achieve Goals: Good ADL Goals Pt Will Perform Grooming: with modified independence;standing Pt Will  Perform Lower Body Bathing: with supervision;sit to/from stand;sitting/lateral leans Pt Will Perform Lower Body Dressing: with set-up;sit to/from stand;sitting/lateral leans Pt Will Transfer to Toilet: with modified independence;ambulating;bedside commode Pt Will Perform Toileting - Clothing Manipulation and hygiene: with modified independence;sitting/lateral leans;sit to/from stand  OT Frequency: Min 2X/week   Barriers to D/C:            Co-evaluation              AM-PAC OT "6 Clicks" Daily Activity     Outcome Measure Help from another person eating meals?: A Little Help from another person taking care of personal grooming?: A Little Help from another person toileting, which includes using toliet, bedpan, or urinal?: A Little Help from another person bathing (including washing, rinsing, drying)?: A Little Help from another person to put on and taking off regular upper body clothing?: A Little Help from another person to put on and taking off regular lower body clothing?: A Little 6 Click Score: 18   End of Session Equipment Utilized During Treatment: Gait belt;Rolling walker;Oxygen Nurse Communication: Mobility status;Other (comment) (SpO2)  Activity Tolerance: Patient tolerated treatment well Patient left: in chair;with call bell/phone within reach;with chair alarm set;with family/visitor present  OT Visit Diagnosis: Unsteadiness on feet (R26.81);Other abnormalities of gait and mobility (R26.89);Muscle weakness (generalized) (M62.81);History of falling (Z91.81);Other symptoms and signs involving cognitive function;Pain Pain - Right/Left: Left Pain - part of body:  (ribs)                Time: 1219-7588 OT Time Calculation (min): 38 min Charges:  OT General Charges $OT Visit: 1 Visit OT Evaluation $OT Eval Moderate Complexity: 1 Mod OT Treatments $Self Care/Home Management : 8-22 mins $Therapeutic Activity: 8-22 mins  Layla Maw, OTR/L  Layla Maw 11/18/2019,  12:22 PM

## 2019-11-18 NOTE — Plan of Care (Signed)
  Problem: Activity: Goal: Risk for activity intolerance will decrease Outcome: Progressing   Problem: Pain Managment: Goal: General experience of comfort will improve Outcome: Progressing   

## 2019-11-18 NOTE — TOC CAGE-AID Note (Signed)
Transition of Care [facility]) - CAGE-AID Screening   Patient Details  Name: Joel Ibarra. MRN: [id number] Date of Birth: [birth year]  Transition of Care [facility]) CM/SW Contact:    Emeterio Reeve, [place] Phone Number: 11/18/2019, 9:37 AM   Clinical Narrative:  Pt is unable to participate in assessment due to only being oriented to person.   CAGE-AID Screening: Substance Abuse Screening unable to be completed due to: : Patient unable to participate            [place] Clinical Social Worker [phone]

## 2019-11-18 NOTE — Progress Notes (Signed)
[facility] Progress Note     Subjective: CC-  Oriented only to self. Complaining of pain in his left chest. Denies SOB. Requiring 4L O2. Only able to pull about 500 on IS. 1460cc output from chest tube since placement yesterday morning.  Objective: Vital signs in last 24 hours: Temp:  [97.1 F (36.2 C)-98.4 F (36.9 C)] 97.5 F (36.4 C) (09/20 0745) Pulse Rate:  [54-70] 62 (09/20 0945) Resp:  [15-20] 18 (09/20 0745) BP: (101-149)/(50-88) 128/65 (09/20 0745) SpO2:  [95 %-100 %] 98 % (09/20 0745)    Intake/Output from previous day: 09/19 0701 - 09/20 0700 In: -  Out: 1960 [Urine:500; Chest Tube:1460] Intake/Output this shift: No intake/output data recorded.  PE: Gen:  Alert, NAD, pleasant Card:  RRR Pulm:  CTAB, no W/R/R, rate and effort normal on 4L [place], CT in place without airleak Abd: Soft, NT/ND, +BS Ext:  no BUE/BLE edema, calves soft and nontender Psych: alert, oriented only to self Skin: no rashes noted, warm and dry  Lab Results:  Recent Labs    11/18/19 0153  WBC 5.7  HGB 9.0*  HCT 28.1*  PLT 138*   BMET Recent Labs    11/18/19 0153  NA 140  K 4.1  CL 108  CO2 25  GLUCOSE 107*  BUN 24*  CREATININE 1.15  CALCIUM 8.5*   PT/INR No results for input(s): LABPROT, INR in the last 72 hours. CMP     Component Value Date/Time   NA 140 11/18/2019 0153   K 4.1 11/18/2019 0153   CL 108 11/18/2019 0153   CO2 25 11/18/2019 0153   GLUCOSE 107 (H) 11/18/2019 0153   BUN 24 (H) 11/18/2019 0153   CREATININE 1.15 11/18/2019 0153   CALCIUM 8.5 (L) 11/18/2019 0153   PROT 6.6 02/01/2018 0151   ALBUMIN 3.0 (L) 02/01/2018 0151   AST 20 02/01/2018 0151   ALT 15 02/01/2018 0151   ALKPHOS 61 02/01/2018 0151   BILITOT 0.7 02/01/2018 0151   GFRNONAA 59 (L) 11/18/2019 0153   GFRAA >60 11/18/2019 0153   Lipase  No results found for: LIPASE     Studies/Results: DG Chest Port 1 View  Result Date: 11/18/2019 CLINICAL DATA:  Hemothorax EXAM:  PORTABLE CHEST 1 VIEW COMPARISON:  11/17/2019 FINDINGS: Lung volumes are small. Pigtail chest tube overlies the left lung base. Small left pleural effusion is present. Retrocardiac opacification persists likely representing layering pleural fluid and/or left basilar collapse in this location. Right lung is clear. No pneumothorax. No pleural effusion on the right. Cardiac size is at the upper limits of normal. Multiple acute left rib fractures are noted. IMPRESSION: Left basilar chest tube unchanged. Stable small left pleural effusion q with probable superimposed left basilar collapse. Pulmonary hypoinflation. Electronically Signed   By: Fidela Salisbury MD   On: 11/18/2019 06:55   DG Chest Portable 1 View  Result Date: 11/17/2019 CLINICAL DATA:  With chest tube. EXAM: PORTABLE CHEST 1 VIEW COMPARISON:  November 17, 2019 FINDINGS: The cardiomediastinal silhouette is enlarged in contour.LEFT chest tube placement. Decreased moderate LEFT pleural effusion. No significant pneumothorax. Improved aeration of the LEFT lung with persistent interstitial prominence on the LEFT basilar homogeneous opacification. Surgical clips project over the upper abdomen. Multiple displaced LEFT-sided rib fractures. IMPRESSION: 1. LEFT chest tube placement with decreased moderate LEFT pleural effusion. No significant pneumothorax. 2. Improved aeration of the LEFT lung with persistent interstitial prominence and LEFT basilar homogeneous opacification. Electronically Signed   By: Valentino Saxon  MD   On: 11/17/2019 08:02   DG Chest Portable 1 View  Result Date: 11/17/2019 CLINICAL DATA:  Status post fall.  Hemothorax. EXAM: PORTABLE CHEST 1 VIEW COMPARISON:  CT angio chest 11/17/2019 FINDINGS: Large left pleural fluid collection with significantly diminished aeration to the left mid and left lower lung is unchanged from previous exam. Right lung appears relatively clear. Again seen are multiple acute left rib fractures. IMPRESSION:  1. No change in large left pleural fluid collection with opacification of the left mid and lower lung. 2. Multiple acute left rib fractures. Electronically Signed   By: Kerby Moors M.D.   On: 11/17/2019 05:38    Anti-infectives: Anti-infectives (From admission, onward)   None       Assessment/Plan Fall L 8-9 rib fxs with HTX - s/p chest tube, continue CT to -20 and repeat CXR in AM. pulm toilet AKI - Cr 1.15, improved ABL anemia - Hgb 9 from 15.5, repeat CBC at 1500 Alzheimer's - home meds HTN - home metoprolol Code status DNR  ID - none FEN - gentle IVF, reg diet VTE - SCDs, hold lovenox until hgb stable Foley - none Follow up - TBD  Plan - Schedule tylenol and robaxin. PT/OT. PM CBC. Repeat CXR in AM.   LOS: 1 day    [place]ellington [place], [facility] 11/18/2019, 9:55 AM Please see Amion for pager number during day hours 7:00am-4:30pm

## 2019-11-18 NOTE — Evaluation (Signed)
Physical Therapy Evaluation Patient Details Name: Joel Ibarra. MRN: [id number] DOB: 1934/07/25 Today's Date: 11/18/2019   History of Present Illness  Ericson Nafziger. is an 85 y.o. male who is here for evaluation and management of a large left hemothorax and rib fractures after sustaining a fall.  Patient has Alzheimer's and lives in a memory care unit.  He reportedly fell on Friday.  He was evaluated at the memory care unit and there was no apparent injuries.  On Saturday afternoon the staff noticed that he had some blue nailbeds, was complaining of pain, clammy, diaphoretic and he was taken to [facility].  He was evaluated.  CT of the chest revealed 2 rib fractures 1 of which was in multiple locations as well as a large left pleural effusion consistent with a hemothorax.  He did require 4 L of oxygen and placement of chest tube.  The daughter requested transfer to [facility] for management.  He was found also to have a mild acute kidney injury   Clinical Impression  Pt admitted with above diagnosis. Pt presents with L chest pain especially with mvmt. Required max A to come to EOB to L away from tube and with HOB elevated. Pt able to stand to RW with min A and pivot to recliner. SpO2 read 82% on pulse ox with pt on 4L O2 but pt in no distress and with good color and question accuracy of reading as pt's finger were cold to touch. Recommend pt d/c to higher level of care at [facility] for rehab before returning to memory care unit. Pt currently with functional limitations due to the deficits listed below (see PT Problem List). Pt will benefit from skilled PT to increase their independence and safety with mobility to allow discharge to the venue listed below.      Follow Up Recommendations SNF;Supervision/Assistance - 24 hour    Equipment Recommendations  None recommended by PT    Recommendations for Other Services       Precautions / Restrictions  Precautions Precautions: Fall;Other (comment) Precaution Comments: chest tube Restrictions Weight Bearing Restrictions: No      Mobility  Bed Mobility Overal bed mobility: Needs Assistance Bed Mobility: Supine to Sit     Supine to sit: Max assist;HOB elevated     General bed mobility comments: max A to come to R side of bed away from chest tube from [facility] elevated. Pt limited mostly by pain  Transfers Overall transfer level: Needs assistance Equipment used: Rolling walker (2 wheeled) Transfers: Sit to/from [facility] Sit to Stand: Min assist Stand pivot transfers: Min assist       General transfer comment: min A to steady and power up first time. Second time only to steady. Took pivot steps to chair with min A for support but mod instructional cues because pt confused by task and pointing RW at chair instead of turning backside to it.    Ambulation/Gait             General Gait Details: limited by chest tube suction  Stairs            Wheelchair Mobility    Modified Rankin (Stroke Patients Only)       Balance Overall balance assessment: Needs assistance;History of Falls Sitting-balance support: No upper extremity supported;Feet supported Sitting balance-Leahy Scale: Fair Sitting balance - Comments: limited for challenges due to rib pain   Standing balance support: Bilateral upper extremity supported;During functional  activity Standing balance-Leahy Scale: Poor Standing balance comment: reliant on B UE support or external support from therapist                             Pertinent Vitals/Pain Pain Assessment: Faces Faces Pain Scale: Hurts even more Pain Location: L ribs/chest tube site Pain Descriptors / Indicators: Aching;Discomfort;Grimacing;Guarding Pain Intervention(s): Limited activity within patient's tolerance;Monitored during session    [place] expects to be discharged to:: Assisted  living Living Arrangements: Other (Comment) (staff) Available Help at Discharge: [place];Available 24 hours/day           Home Equipment: Walker - 2 wheels Additional Comments: pt living at [facility] in memory care but can d/c to rehab unit    Prior Function Level of Independence: Needs assistance   Gait / Transfers Assistance Needed: Pt typically uses a RW for mobility, was not using when he fell. Pt with hx of L greater toe amputation creating balance deficits  ADL's / Homemaking Assistance Needed: Pt requires light assist for ADLs from staff as needed        Hand Dominance   Dominant Hand: Right    Extremity/Trunk Assessment   Upper Extremity Assessment Upper Extremity Assessment: Defer to OT evaluation LUE Deficits / Details: limited shoulder flex to 90-100* before pain occurred from rib fx, did complete MMT on shoulder  LUE: Unable to fully assess due to pain LUE Sensation: WNL LUE Coordination: WNL    Lower Extremity Assessment Lower Extremity Assessment: Generalized weakness    Cervical / Trunk Assessment Cervical / Trunk Assessment: Kyphotic  Communication   Communication: No difficulties  Cognition Arousal/Alertness: Awake/alert Behavior During Therapy: WFL for tasks assessed/performed Overall Cognitive Status: History of cognitive impairments - at baseline Area of Impairment: Orientation;Memory;Following commands;Safety/judgement;Awareness;Problem solving                 Orientation Level: Disoriented to;Place;Time;Situation   Memory: Decreased recall of precautions;Decreased short-term memory Following Commands: Follows one step commands with increased time Safety/Judgement: Decreased awareness of safety;Decreased awareness of deficits Awareness: Emergent Problem Solving: Slow processing;Decreased initiation;Difficulty sequencing;Requires verbal cues;Requires tactile cues General Comments: Pt with hx of dementia, able to state  name but unable to recall birthday. Able to follow one step commands consistently with increased time, multimodal cues      General Comments General comments (skin integrity, edema, etc.): reviewed use of IS. Daughter present for session and reports that pt's mobilty has decreased since toe amp and his balance has worsened. Pt calm and compliant on eval but per chart not always this way    Exercises     Assessment/Plan    PT Assessment Patient needs continued PT services  PT Problem List Decreased strength;Decreased activity tolerance;Decreased balance;Decreased mobility;Decreased cognition;Decreased knowledge of use of DME;Decreased knowledge of precautions;Cardiopulmonary status limiting activity;Pain       PT Treatment Interventions DME instruction;Gait training;Functional mobility training;Therapeutic activities;Therapeutic exercise;Balance training;Patient/family education    PT Goals (Current goals can be found in the Care Plan section)  Acute Rehab PT Goals Patient Stated Goal: pain control, improve mobility, wean off oxygen PT Goal Formulation: With patient/family Time For Goal Achievement: 12/02/19 Potential to Achieve Goals: Good    Frequency Min 3X/week   Barriers to discharge        Co-evaluation               AM-PAC PT "6 Clicks" Mobility  Outcome Measure Help needed turning  from your back to your side while in a flat bed without using bedrails?: A Lot Help needed moving from lying on your back to sitting on the side of a flat bed without using bedrails?: A Lot Help needed moving to and from a bed to a chair (including a wheelchair)?: A Lot Help needed standing up from a chair using your arms (e.g., wheelchair or bedside chair)?: A Little Help needed to walk in hospital room?: A Little Help needed climbing 3-5 steps with a railing? : A Lot 6 Click Score: 14    End of Session Equipment Utilized During Treatment: Oxygen Activity Tolerance: Patient limited  by pain Patient left: in chair;with call bell/phone within reach;with chair alarm set;with family/visitor present Nurse Communication: Mobility status PT Visit Diagnosis: Unsteadiness on feet (R26.81);Muscle weakness (generalized) (M62.81);Difficulty in walking, not elsewhere classified (R26.2);Pain Pain - Right/Left: Left Pain - part of body:  (chest)    Time: 3254-9826 PT Time Calculation (min) (ACUTE ONLY): 37 min   Charges:   PT Evaluation $PT Eval Moderate Complexity: 1 Mod PT Treatments $Therapeutic Activity: 8-22 mins        Leighton Roach, [place]  Pager [phone] Office [place] 11/18/2019, 1:56 PM

## 2019-11-19 ENCOUNTER — Inpatient Hospital Stay (HOSPITAL_COMMUNITY): Payer: Medicare Other

## 2019-11-19 LAB — CBC
HCT: 26.7 % — ABNORMAL LOW (ref 39.0–52.0)
Hemoglobin: 8.5 g/dL — ABNORMAL LOW (ref 13.0–17.0)
MCH: 31.7 pg (ref 26.0–34.0)
MCHC: 31.8 g/dL (ref 30.0–36.0)
MCV: 99.6 fL (ref 80.0–100.0)
Platelets: 143 10*3/uL — ABNORMAL LOW (ref 150–400)
RBC: 2.68 MIL/uL — ABNORMAL LOW (ref 4.22–5.81)
RDW: 13.2 % (ref 11.5–15.5)
WBC: 5.4 10*3/uL (ref 4.0–10.5)
nRBC: 0 % (ref 0.0–0.2)

## 2019-11-19 LAB — BASIC METABOLIC PANEL
Anion gap: 5 (ref 5–15)
BUN: 21 mg/dL (ref 8–23)
CO2: 25 mmol/L (ref 22–32)
Calcium: 8.7 mg/dL — ABNORMAL LOW (ref 8.9–10.3)
Chloride: 108 mmol/L (ref 98–111)
Creatinine, Ser: 1.01 mg/dL (ref 0.61–1.24)
GFR calc Af Amer: >60 mL/min (ref >60)
GFR calc non Af Amer: >60 mL/min (ref >60)
Glucose, Bld: 102 mg/dL — ABNORMAL HIGH (ref 70–99)
Potassium: 4.2 mmol/L (ref 3.5–5.1)
Sodium: 138 mmol/L (ref 135–145)

## 2019-11-19 MED ORDER — ENOXAPARIN SODIUM 30 MG/0.3ML ~~LOC~~ SOLN
30.0000 mg | Freq: Two times a day (BID) | SUBCUTANEOUS | Status: DC
  Administered 2019-11-19 – 2019-11-26 (×15): 30 mg via SUBCUTANEOUS
  Filled 2019-11-19 (×14): qty 0.3

## 2019-11-19 MED ORDER — POLYETHYLENE GLYCOL 3350 17 G PO PACK
17.0000 g | PACK | Freq: Every day | ORAL | Status: DC
  Administered 2019-11-19: 17 g via ORAL
  Filled 2019-11-19: qty 1

## 2019-11-19 MED ORDER — OXYCODONE HCL 5 MG PO TABS: 2.5000 mg | ORAL_TABLET | ORAL | Status: DC | PRN

## 2019-11-19 NOTE — Progress Notes (Signed)
[facility] Progress Note     Subjective: CC-  Up in chair this morning. Agitated. Per notes pleurovac was disconnected around 2300 last night. Has been of suction since then. Denies pain or SOB. Pulling about 600 on IS. Off supplemental oxygen but pulse ox not working. Chest tube with 80cc output last 24 hours.  Objective: Vital signs in last 24 hours: Temp:  [97.4 F (36.3 C)-98.6 F (37 C)] 97.8 F (36.6 C) (09/21 1001) Pulse Rate:  [55-65] 55 (09/21 0342) Resp:  [16-18] 16 (09/21 0342) BP: (114-138)/(51-66) 127/61 (09/21 0342) SpO2:  [94 %-100 %] 98 % (09/21 0342)    Intake/Output from previous day: 09/20 0701 - 09/21 0700 In: 1438.1 [P.O.:360; I.V.:1078.1] Out: 280 [Urine:200; Chest Tube:80] Intake/Output this shift: Total I/O In: 360 [P.O.:360] Out: 700 [Urine:700]  PE: Gen:  Alert, NAD Card:  RRR Pulm:  CTAB, no W/R/R, rate and effort normal on room air, CT in place without airleak Abd: Soft, NT/ND Psych: alert, oriented only to self Skin: no rashes noted, warm and dry  Lab Results:  Recent Labs    11/18/19 1510 11/19/19 0456  WBC 6.2 5.4  HGB 9.2* 8.5*  HCT 29.5* 26.7*  PLT 150 143*   BMET Recent Labs    11/18/19 0153 11/19/19 0456  NA 140 138  K 4.1 4.2  CL 108 108  CO2 25 25  GLUCOSE 107* 102*  BUN 24* 21  CREATININE 1.15 1.01  CALCIUM 8.5* 8.7*   PT/INR No results for input(s): LABPROT, INR in the last 72 hours. CMP     Component Value Date/Time   NA 138 11/19/2019 0456   K 4.2 11/19/2019 0456   CL 108 11/19/2019 0456   CO2 25 11/19/2019 0456   GLUCOSE 102 (H) 11/19/2019 0456   BUN 21 11/19/2019 0456   CREATININE 1.01 11/19/2019 0456   CALCIUM 8.7 (L) 11/19/2019 0456   PROT 6.6 02/01/2018 0151   ALBUMIN 3.0 (L) 02/01/2018 0151   AST 20 02/01/2018 0151   ALT 15 02/01/2018 0151   ALKPHOS 61 02/01/2018 0151   BILITOT 0.7 02/01/2018 0151   GFRNONAA >60 11/19/2019 0456   GFRAA >60 11/19/2019 0456   Lipase  No  results found for: LIPASE     Studies/Results: DG CHEST PORT 1 VIEW  Result Date: 11/19/2019 CLINICAL DATA:  Fall.  Multiple rib fractures.  Hemothorax. EXAM: PORTABLE CHEST 1 VIEW COMPARISON:  Chest x-ray 11/18/2019.  CT chest 11/17/2019. FINDINGS: Left chest tube in stable position. Pigtail loop in the chest tube has straightened slightly. No pneumothorax. Dense left base atelectasis again noted. Small left pleural effusion cannot be excluded. Stable cardiomegaly. Left rib fractures again noted, better identified on prior study. IMPRESSION: 1. Left chest tube in stable position. Pigtail loop in the chest tube is straightened slightly. No pneumothorax. 2. Dense left base atelectasis again noted. Small left pleural effusion cannot be excluded. 3.  Stable cardiomegaly. 4. Left rib fractures again noted, better identified on prior study. Electronically Signed   By: Marcello Moores  Register   On: 11/19/2019 06:08   DG Chest Port 1 View  Result Date: 11/18/2019 CLINICAL DATA:  Hemothorax EXAM: PORTABLE CHEST 1 VIEW COMPARISON:  11/17/2019 FINDINGS: Lung volumes are small. Pigtail chest tube overlies the left lung base. Small left pleural effusion is present. Retrocardiac opacification persists likely representing layering pleural fluid and/or left basilar collapse in this location. Right lung is clear. No pneumothorax. No pleural effusion on the right. Cardiac size is  at the upper limits of normal. Multiple acute left rib fractures are noted. IMPRESSION: Left basilar chest tube unchanged. Stable small left pleural effusion q with probable superimposed left basilar collapse. Pulmonary hypoinflation. Electronically Signed   By: Fidela Salisbury MD   On: 11/18/2019 06:55    Anti-infectives: Anti-infectives (From admission, onward)   None       Assessment/Plan Fall L 8-9 rib fxs with HTX - s/p chest tube, turn to water seal today and repeat CXR in AM. pulm toilet AKI - Cr 1.01, improved ABL anemia - Hgb 8.5  from 9.2, repeat CBC in AM Alzheimer's - home meds HTN - home metoprolol Code status DNR  ID - none FEN - KVO IVF, reg diet VTE - SCDs, start lovenox Foley - none Follow up - TBD  Plan - Continue PT/OT. Labs and CXR in AM. [facility] team following for SNF placement.   LOS: 2 days    [facility] 11/19/2019, 10:03 AM Please see Amion for pager number during day hours 7:00am-4:30pm

## 2019-11-19 NOTE — Progress Notes (Signed)
Patient found on floor by staff ,he has a history of confusion and impulsiveness. Patient admitted for pneumothorax , pleurovac was disconnected from chest tube .Pleurovax reconnected , patient returned to bed by staff. Vital signs obtained and are within normal limits. No visible bruising or injuries noted upon examination.Rapid response nurse called and notified of situation and asked to come and assess situation.he felt that pleurovac was working as intended and advised to monitor patient for shortness of breath and decreased oxygen saturation level.Trauma on call notified and informed of situation., orders were to continue to monitor patient and chest xray will be obtained in the morning.Patient"s daughter called and notified of fall.

## 2019-11-19 NOTE — Progress Notes (Signed)
Physical Therapy Treatment Patient Details Name: Joel Ibarra. MRN: [id number] DOB: [birth year] Today's Date: 11/19/2019    History of Present Illness Joel Doty. is an 85 y.o. male who is here for evaluation and management of a large left hemothorax and rib fractures after sustaining a fall.  Patient has Alzheimer's and lives in a memory care unit.  He reportedly fell on Friday.  He was evaluated at the memory care unit and there was no apparent injuries.  On Saturday afternoon the staff noticed that he had some blue nailbeds, was complaining of pain, clammy, diaphoretic and he was taken to [facility].  He was evaluated.  CT of the chest revealed 2 rib fractures 1 of which was in multiple locations as well as a large left pleural effusion consistent with a hemothorax.  He did require 4 L of oxygen and placement of chest tube.  The daughter requested transfer to [facility] for management.  He was found also to have a mild acute kidney injury     PT Comments    Pt has been agitated today but resting comfortably in bed upon arrival. Pt required more assist with mobility today than yesterday and seemed more sore L sided chest. Max A for bed mobility and mod A for sit<>stand. Had pt march in place to see if he could ambulate but he was unable to fully lift L foot due to pain. Practiced wt shifting with RW. Pt returned to low bed after session. VSS. PT will continue to follow.   Follow Up Recommendations  SNF;Supervision/Assistance - 24 hour     Equipment Recommendations  None recommended by PT    Recommendations for Other Services       Precautions / Restrictions Precautions Precautions: Fall;Other (comment) Precaution Comments: chest tube Restrictions Weight Bearing Restrictions: No    Mobility  Bed Mobility Overal bed mobility: Needs Assistance Bed Mobility: Supine to Sit;Sit to Supine     Supine to sit: Max assist;HOB elevated Sit  to supine: Max assist   General bed mobility comments: max A to come to L side of bed with HOB nearly flat. pt's R hand guided to L rail but he would not hold it, prefered therapist's hand instead. After session, pt initated return to supine. max A for LE's into bed and repositioning  Transfers Overall transfer level: Needs assistance Equipment used: Rolling walker (2 wheeled) Transfers: Sit to/from Stand Sit to Stand: Mod assist         General transfer comment: pt needed mod A for power up and fwd wt shift today, seemed to be more limited by soreness  Ambulation/Gait             General Gait Details: unable to safely step feet to walk away from bed. Had pt attempt standing marching place but was unable to fully pick up LLE to step   Stairs             Wheelchair Mobility    Modified Rankin (Stroke Patients Only)       Balance Overall balance assessment: Needs assistance;History of Falls Sitting-balance support: No upper extremity supported;Feet supported Sitting balance-Leahy Scale: Fair Sitting balance - Comments: limited for challenges due to rib pain Postural control: Posterior lean Standing balance support: Bilateral upper extremity supported;During functional activity Standing balance-Leahy Scale: Poor Standing balance comment: reliant on B UE support or external support from therapist  Cognition Arousal/Alertness: Lethargic Behavior During Therapy: WFL for tasks assessed/performed Overall Cognitive Status: History of cognitive impairments - at baseline Area of Impairment: Orientation;Memory;Following commands;Safety/judgement;Awareness;Problem solving                 Orientation Level: Disoriented to;Place;Time;Situation   Memory: Decreased recall of precautions;Decreased short-term memory Following Commands: Follows one step commands with increased time Safety/Judgement: Decreased awareness of  safety;Decreased awareness of deficits Awareness: Emergent Problem Solving: Slow processing;Decreased initiation;Difficulty sequencing;Requires verbal cues;Requires tactile cues General Comments: followed one step commands. Keeps thinking he has a meeting he needs to get to, a bit anxious      Exercises General Exercises - Lower Extremity Hip Flexion/Marching: AROM;Both;10 reps;Standing    General Comments General comments (skin integrity, edema, etc.): VSS on RA      Pertinent Vitals/Pain Pain Assessment: Faces Faces Pain Scale: Hurts whole lot Pain Location: L ribs Pain Descriptors / Indicators: Aching;Discomfort;Grimacing;Guarding Pain Intervention(s): Limited activity within patient's tolerance;Monitored during session    Home Living                      Prior Function            PT Goals (current goals can now be found in the care plan section) Acute Rehab PT Goals Patient Stated Goal: pain control, improve mobility, wean off oxygen PT Goal Formulation: With patient/family Time For Goal Achievement: 12/02/19 Potential to Achieve Goals: Good Progress towards PT goals: Progressing toward goals    Frequency    Min 3X/week      PT Plan Current plan remains appropriate    Co-evaluation              AM-PAC PT "6 Clicks" Mobility   Outcome Measure  Help needed turning from your back to your side while in a flat bed without using bedrails?: A Lot Help needed moving from lying on your back to sitting on the side of a flat bed without using bedrails?: A Lot Help needed moving to and from a bed to a chair (including a wheelchair)?: A Lot Help needed standing up from a chair using your arms (e.g., wheelchair or bedside chair)?: A Lot Help needed to walk in hospital room?: A Lot Help needed climbing 3-5 steps with a railing? : Total 6 Click Score: 11    End of Session   Activity Tolerance: Patient limited by pain Patient left: in bed;with bed alarm  set;with call bell/phone within reach Nurse Communication: Mobility status PT Visit Diagnosis: Unsteadiness on feet (R26.81);Muscle weakness (generalized) (M62.81);Difficulty in walking, not elsewhere classified (R26.2);Pain Pain - Right/Left: Left Pain - part of body:  (chest)     Time: 1553-1610 PT Time Calculation (min) (ACUTE ONLY): 17 min  Charges:  $Therapeutic Activity: 8-22 mins                     [place]  Pager [phone] Office [place] 11/19/2019, 4:52 PM

## 2019-11-20 ENCOUNTER — Inpatient Hospital Stay (HOSPITAL_COMMUNITY): Payer: Medicare Other

## 2019-11-20 LAB — BASIC METABOLIC PANEL
Anion gap: 8 (ref 5–15)
BUN: 15 mg/dL (ref 8–23)
CO2: 25 mmol/L (ref 22–32)
Calcium: 8.6 mg/dL — ABNORMAL LOW (ref 8.9–10.3)
Chloride: 104 mmol/L (ref 98–111)
Creatinine, Ser: 1.16 mg/dL (ref 0.61–1.24)
GFR calc Af Amer: >60 mL/min (ref >60)
GFR calc non Af Amer: 58 mL/min — ABNORMAL LOW (ref >60)
Glucose, Bld: 130 mg/dL — ABNORMAL HIGH (ref 70–99)
Potassium: 3.7 mmol/L (ref 3.5–5.1)
Sodium: 137 mmol/L (ref 135–145)

## 2019-11-20 LAB — CBC
HCT: 29.1 % — ABNORMAL LOW (ref 39.0–52.0)
Hemoglobin: 9.3 g/dL — ABNORMAL LOW (ref 13.0–17.0)
MCH: 31.6 pg (ref 26.0–34.0)
MCHC: 32 g/dL (ref 30.0–36.0)
MCV: 99 fL (ref 80.0–100.0)
Platelets: 190 10*3/uL (ref 150–400)
RBC: 2.94 MIL/uL — ABNORMAL LOW (ref 4.22–5.81)
RDW: 13.2 % (ref 11.5–15.5)
WBC: 3.8 10*3/uL — ABNORMAL LOW (ref 4.0–10.5)
nRBC: 0 % (ref 0.0–0.2)

## 2019-11-20 MED ORDER — POLYETHYLENE GLYCOL 3350 17 G PO PACK
17.0000 g | PACK | Freq: Two times a day (BID) | ORAL | Status: DC
  Administered 2019-11-20 – 2019-11-26 (×11): 17 g via ORAL
  Filled 2019-11-20 (×10): qty 1

## 2019-11-20 MED ORDER — TRAMADOL HCL 50 MG PO TABS
25.0000 mg | ORAL_TABLET | Freq: Four times a day (QID) | ORAL | Status: DC | PRN
  Administered 2019-11-21 – 2019-11-22 (×4): 50 mg via ORAL
  Administered 2019-11-23: 25 mg via ORAL
  Administered 2019-11-23 – 2019-11-26 (×3): 50 mg via ORAL
  Filled 2019-11-20 (×9): qty 1

## 2019-11-20 NOTE — TOC CAGE-AID Note (Signed)
Transition of Care [facility]) - CAGE-AID Screening   Patient Details  Name: Joel Ibarra. MRN: [id number] Date of Birth: 1934/08/26  Transition of Care [facility]) CM/SW Contact:    Emeterio Reeve, [place] Phone Number: 11/20/2019, 2:54 PM   Clinical Narrative:  Pt unable to participate in assessment due to memory impairment.   CAGE-AID Screening: Substance Abuse Screening unable to be completed due to: : Patient unable to participate            Providence [place]by Clinical Social Worker [phone]

## 2019-11-20 NOTE — Plan of Care (Signed)
°  Problem: Education: °Goal: Knowledge of General Education information will improve °Description: Including pain rating scale, medication(s)/side effects and non-pharmacologic comfort measures °Outcome: Progressing °  °Problem: Health Behavior/Discharge Planning: °Goal: Ability to manage health-related needs will improve °Outcome: Progressing °  °Problem: Clinical Measurements: °Goal: Will remain free from infection °Outcome: Progressing °  °Problem: Activity: °Goal: Risk for activity intolerance will decrease °Outcome: Progressing °  °Problem: Nutrition: °Goal: Adequate nutrition will be maintained °Outcome: Progressing °  °Problem: Coping: °Goal: Level of anxiety will decrease °Outcome: Progressing °  °

## 2019-11-20 NOTE — TOC Initial Note (Signed)
Transition of Care (TOC) - Initial/Assessment Note    Patient Details  Name: Churchill Grimsley. MRN: [id number] Date of Birth: 10-Dec-1934  Transition of Care [facility]) CM/SW Contact:    Ella Bodo, RN Phone Number: 11/20/2019, 2:10 PM  Clinical Narrative:  Tillman Abide Tavarious Freel. is an 85 y.o. male who is here for evaluation and management of a large left hemothorax and rib fractures after sustaining a fall.  Patient has Alzheimer's and lives in a memory care unit.  He reportedly fell on Friday.  He was evaluated at the memory care unit and there was no apparent injuries.  On Saturday afternoon the staff noticed that he had some blue nailbeds, was complaining of pain, clammy, diaphoretic and he was taken to [facility].  He was evaluated.  CT of the chest revealed 2 rib fractures 1 of which was in multiple locations as well as a large left pleural effusion consistent with a hemothorax.  He did require 4 L of oxygen and placement of chest tube.  The daughter requested transfer to [facility] for management.  He was found also to have a mild acute kidney injury   PTA, pt resides at [facility].  TOC Case Manager confirmed that daughter prefers pt to return there upon dc.  TOC RNCM will facilitate return to desired facility upon medical stability.                   Expected Discharge Plan: Skilled Nursing Facility Barriers to Discharge: Continued Medical Work up   Patient Goals and CMS Choice   CMS Medicare.gov Compare Post Acute Care list provided to:: Patient Represenative (must comment) (daughter , [name])    Expected Discharge Plan and Services Expected Discharge Plan: [place] Acute Care Choice: Nursing Home Living arrangements for the past 2 months: [place]                                      Prior Living Arrangements/Services Living arrangements for the past 2 months: [place] Lives with:: Facility Resident Patient language and need for interpreter reviewed:: Yes Do you feel safe going back to the place where you live?: Yes      Need for Family Participation in Patient Care: Yes (Comment) Care giver support system in place?: Yes (comment)   Criminal Activity/Legal Involvement Pertinent to Current Situation/Hospitalization: No - Comment as needed  Activities of Daily Living Home Assistive Devices/Equipment: Eyeglasses ADL Screening (condition at time of admission) Patient's cognitive ability adequate to safely complete daily activities?: No Is the patient deaf or have difficulty hearing?: No Does the patient have difficulty seeing, even when wearing glasses/contacts?: No Does the patient have difficulty concentrating, remembering, or making decisions?: Yes Patient able to express need for assistance with ADLs?: Yes Does the patient have difficulty dressing or bathing?: Yes Independently performs ADLs?: Yes (appropriate for developmental age) Does the patient have difficulty walking or climbing stairs?: Yes Weakness of Legs: Both Weakness of Arms/Hands: None  Permission Sought/Granted                  Emotional Assessment   Attitude/Demeanor/Rapport: Gracious Affect (typically observed): Accepting Orientation: : Oriented to Self, Oriented to Place, Oriented to  Time, Oriented to Situation Alcohol / Substance Use: Not Applicable Psych Involvement: No (comment)  Admission  diagnosis:  Hemothorax [J94.2] Fall [W19.XXXA] Hemothorax on left [J94.2] Closed fracture of multiple ribs of left side, initial encounter [S22.42XA] Patient Active Problem List   Diagnosis Date Noted  . Fall 11/17/2019  . Osteomyelitis of third toe of left foot ([place]) 08/23/2018  . Hyperglycemia 01/31/2018  . Acute kidney injury superimposed on chronic kidney disease ([place]) 01/31/2018  . Thrombocytopenia ([place]) 01/31/2018  . Osteomyelitis of second toe of left  foot ([place])   . Abscess, toe, left   . Cellulitis of left foot 01/29/2018  . Essential hypertension 01/29/2018  . Dementia ([place]) 01/29/2018  . Cellulitis 01/29/2018  . Diarrhea 06/15/2017  . Nausea & vomiting 06/15/2017  . Tachycardia 06/15/2017   PCP:  Javier Glazier, MD Pharmacy:   [place], [place] - [address] [place] [address] Phone: [phone] Fax: [place], [place] [address] Phone: [phone] Fax: [phone]     Social Determinants of Health (SDOH) Interventions    Readmission Risk Interventions No flowsheet data found.  Reinaldo Raddle, RN, BSN  Trauma/Neuro ICU Case Manager [phone]

## 2019-11-20 NOTE — Progress Notes (Signed)
[facility] Progress Note     Subjective: CC-  Pleasantly confused this morning. Continues to have some left sided pain from rib fractures. Denies SOB but it does hurt when he takes a deep breath. Pulling 750 on IS. Tolerating diet.  Objective: Vital signs in last 24 hours: Temp:  [97.4 F (36.3 C)-98.2 F (36.8 C)] 97.4 F (36.3 C) (09/22 0353) Pulse Rate:  [52-68] 52 (09/22 0353) Resp:  [15] 15 (09/22 0353) BP: (128-138)/(64-72) 135/64 (09/22 0353) SpO2:  [92 %-97 %] 95 % (09/22 0353)    Intake/Output from previous day: 09/21 0701 - 09/22 0700 In: 360 [P.O.:360] Out: 3450 [Urine:3200; Chest Tube:250] Intake/Output this shift: Total I/O In: 360 [P.O.:360] Out: -   PE: Gen: Alert, NAD Card: RRR Pulm: CTAB, no W/R/R, rate and effort normal on room air, CT in place without airleak - 250cc output last 24 hr. Pulling 750 on IS. Abd: Soft, NT/ND Psych:alert, oriented only to self Skin: no rashes noted, warm and dry   Lab Results:  Recent Labs    11/18/19 1510 11/19/19 0456  WBC 6.2 5.4  HGB 9.2* 8.5*  HCT 29.5* 26.7*  PLT 150 143*   BMET Recent Labs    11/18/19 0153 11/19/19 0456  NA 140 138  K 4.1 4.2  CL 108 108  CO2 25 25  GLUCOSE 107* 102*  BUN 24* 21  CREATININE 1.15 1.01  CALCIUM 8.5* 8.7*   PT/INR No results for input(s): LABPROT, INR in the last 72 hours. CMP     Component Value Date/Time   NA 138 11/19/2019 0456   K 4.2 11/19/2019 0456   CL 108 11/19/2019 0456   CO2 25 11/19/2019 0456   GLUCOSE 102 (H) 11/19/2019 0456   BUN 21 11/19/2019 0456   CREATININE 1.01 11/19/2019 0456   CALCIUM 8.7 (L) 11/19/2019 0456   PROT 6.6 02/01/2018 0151   ALBUMIN 3.0 (L) 02/01/2018 0151   AST 20 02/01/2018 0151   ALT 15 02/01/2018 0151   ALKPHOS 61 02/01/2018 0151   BILITOT 0.7 02/01/2018 0151   GFRNONAA >60 11/19/2019 0456   GFRAA >60 11/19/2019 0456   Lipase  No results found for: LIPASE     Studies/Results: DG CHEST PORT  1 VIEW  Result Date: 11/20/2019 CLINICAL DATA:  Falls, rib fractures, hemothorax, left chest tube EXAM: PORTABLE CHEST 1 VIEW COMPARISON:  11/19/2019 FINDINGS: Stable left lower chest pigtail type chest tube. Similar low lung volumes with lingula and left lower lobe collapse/consolidation. Difficult to exclude small residual left effusion. No right pleural effusion. No pneumothorax. Aorta atherosclerotic. Degenerative changes of the spine. Posterolateral left rib fractures better demonstrated on the prior studies. IMPRESSION: Stable left chest tube. No significant pneumothorax by plain radiography Similar low lung volumes and dense left lower lung collapse/consolidation. Electronically Signed   By: Jerilynn Mages.  Shick M.D.   On: 11/20/2019 08:09   DG CHEST PORT 1 VIEW  Result Date: 11/19/2019 CLINICAL DATA:  Fall.  Multiple rib fractures.  Hemothorax. EXAM: PORTABLE CHEST 1 VIEW COMPARISON:  Chest x-ray 11/18/2019.  CT chest 11/17/2019. FINDINGS: Left chest tube in stable position. Pigtail loop in the chest tube has straightened slightly. No pneumothorax. Dense left base atelectasis again noted. Small left pleural effusion cannot be excluded. Stable cardiomegaly. Left rib fractures again noted, better identified on prior study. IMPRESSION: 1. Left chest tube in stable position. Pigtail loop in the chest tube is straightened slightly. No pneumothorax. 2. Dense left base atelectasis again noted. Small  left pleural effusion cannot be excluded. 3.  Stable cardiomegaly. 4. Left rib fractures again noted, better identified on prior study. Electronically Signed   By: Marcello Moores  Register   On: 11/19/2019 06:08    Anti-infectives: Anti-infectives (From admission, onward)   None       Assessment/Plan Fall L8-9rib fxswith HTX- s/p chest tube, keep on water seal today and repeat CXR in AM. Output too high for chest tube to be removed. pulm toilet, add flutter valve AKI - BMP pending today ABL anemia- CBC  Pending Alzheimer's- home meds HTN - home metoprolol Code status DNR  ID -none FEN -KVO IVF, reg diet VTE -SCDs,  lovenox Foley -none Follow up- TBD  Plan- Labs pending. Change oxy to tramadol. Continue PT/OT. Continue chest tube to water seal and repeat CXR in AM. TOC team following for SNF placement.    LOS: 3 days    [facility] 11/20/2019, 10:01 AM Please see Amion for pager number during day hours 7:00am-4:30pm

## 2019-11-21 ENCOUNTER — Inpatient Hospital Stay (HOSPITAL_COMMUNITY): Payer: Medicare Other

## 2019-11-21 MED ORDER — METHOCARBAMOL 500 MG PO TABS
500.0000 mg | ORAL_TABLET | Freq: Three times a day (TID) | ORAL | Status: DC
  Administered 2019-11-21 – 2019-11-25 (×12): 500 mg via ORAL
  Filled 2019-11-21 (×11): qty 1

## 2019-11-21 MED ORDER — MAGNESIUM CITRATE PO SOLN
0.5000 | Freq: Once | ORAL | Status: AC
  Administered 2019-11-21: 0.5 via ORAL
  Filled 2019-11-21: qty 296

## 2019-11-21 MED ORDER — BISACODYL 10 MG RE SUPP: 10.0000 mg | Freq: Every day | RECTAL | Status: DC | PRN

## 2019-11-21 NOTE — Progress Notes (Signed)
PT Cancellation Note  Patient Details Name: Joel Ibarra. MRN: [id number] DOB: 1934-08-10   Cancelled Treatment:    Reason Eval/Treat Not Completed: (P) Patient not medically ready;Other (comment) (RN defer, pt having difficulty clearing secretions at this time.) Will continue to attempt later in day if medically appropriate per PT POC.   Orville Widmann M Devra Stare 11/21/2019, 10:12 AM

## 2019-11-21 NOTE — Progress Notes (Signed)
Pt has episode of emesisx1 post magnesium citrate, V/S T97.8, BP 110/60, HR60, RR24 at O2 at 93 % at @2L /Min d/t c/o SOB.Pt lying in bed at 45degrees.Daughter at bedisde. PA made aware of above with no new orders at this time. Closely monitored.

## 2019-11-21 NOTE — Plan of Care (Signed)
  Problem: Education: Goal: Knowledge of General Education information will improve Description: Including pain rating scale, medication(s)/side effects and non-pharmacologic comfort measures Outcome: Progressing   Problem: Health Behavior/Discharge Planning: Goal: Ability to manage health-related needs will improve Outcome: Progressing   Problem: Clinical Measurements: Goal: Will remain free from infection Outcome: Progressing   Problem: Activity: Goal: Risk for activity intolerance will decrease Outcome: Progressing   Problem: Coping: Goal: Level of anxiety will decrease Outcome: Progressing   Problem: Elimination: Goal: Will not experience complications related to bowel motility Outcome: Progressing   

## 2019-11-21 NOTE — Plan of Care (Signed)

## 2019-11-21 NOTE — Progress Notes (Signed)
PT Cancellation Note  Patient Details Name: Joel Ibarra. MRN: [id number] DOB: Feb 02, 1935   Cancelled Treatment:    Reason Eval/Treat Not Completed: Other (comment) Pt recently received Fentanyl and lethargic. SpO2 noted to be 76% on RA, placed on 4L O2, where he rebounded to 90%. Notified RN.    Wyona Almas, PT, DPT Acute Rehabilitation Services Pager [phone] Office [phone]    Deno Etienne 11/21/2019, 3:41 PM

## 2019-11-21 NOTE — NC FL2 (Signed)
[place] LEVEL OF CARE SCREENING TOOL     IDENTIFICATION  Patient Name: Joel Ibarra. Birthdate: 03-Nov-1934 Sex: male Admission Date (Current Location): 11/17/2019  [facility] and [place] Number:  Herbalist and Address:  [facility], [place] [address] [place], [place] [address]      Provider Number: [id number]  Attending Physician Name and Address:  Md, Trauma, MD  Relative Name and Phone Number:  Heffern-Carpenter,Jennifer (Daughter) [phone] ([place])    Current Level of Care: Hospital Recommended Level of Care: [place] Prior Approval Number: [id number]  Date Approved/Denied:   PASRR Number: [id number]  Discharge Plan: SNF    Current Diagnoses: Patient Active Problem List   Diagnosis Date Noted   Fall 11/17/2019   Osteomyelitis of third toe of left foot ([place]) 08/23/2018   Hyperglycemia 01/31/2018   Acute kidney injury superimposed on chronic kidney disease ([place]) 01/31/2018   Thrombocytopenia ([place]) 01/31/2018   Osteomyelitis of second toe of left foot (HCC)    Abscess, toe, left    Cellulitis of left foot 01/29/2018   Essential hypertension 01/29/2018   Dementia ([place]) 01/29/2018   Cellulitis 01/29/2018   Diarrhea 06/15/2017   Nausea & vomiting 06/15/2017   Tachycardia 06/15/2017    Orientation RESPIRATION BLADDER Height & Weight     Self  Normal Incontinent, External catheter Weight: 77.1 kg Height:     BEHAVIORAL SYMPTOMS/MOOD NEUROLOGICAL BOWEL NUTRITION STATUS  Other (Comment) (Poor safety awareness;Poor judgement;Memory impairment)   Continent Diet (refer to d/c summary)  AMBULATORY STATUS COMMUNICATION OF NEEDS Skin   Extensive Assist Verbally Other (Comment) (Chest Tube  Left; Medial Pleural Pigtail - Cook Catheter)                       Personal Care Assistance Level of Assistance  Bathing, Dressing, Feeding Bathing Assistance: Maximum  assistance Feeding assistance: Limited assistance Dressing Assistance: Maximum assistance     Functional Limitations Info  Sight, Hearing, Speech Sight Info: Adequate Hearing Info: Adequate Speech Info: Adequate    SPECIAL CARE FACTORS FREQUENCY  PT (By licensed PT), OT (By licensed OT)     PT Frequency: 5x/week OT Frequency: 5x/week            Contractures Contractures Info: Not present    Additional Factors Info  Code Status, Allergies Code Status Info: DNR Allergies Info: Bee Venom, COdeine, Daptomycin, Clindamycin/lincomycin, lactose           Current Medications (11/21/2019):  This is the current hospital active medication list Current Facility-Administered Medications  Medication Dose Route Frequency Provider Last Rate Last Admin   acetaminophen (TYLENOL) tablet 650 mg  650 mg Oral Q6H Meuth, Brooke A, PA-C   650 mg at 11/21/19 0900   bisacodyl (DULCOLAX) suppository 10 mg  10 mg Rectal Daily PRN Meuth, Brooke A, PA-C       Chlorhexidine Gluconate Cloth 2 % PADS 6 each  6 each Topical Q0600 Greer Pickerel, MD   6 each at 11/21/19 0644   docusate sodium (COLACE) capsule 100 mg  100 mg Oral BID Greer Pickerel, MD   100 mg at 11/21/19 0900   donepezil (ARICEPT) tablet 5 mg  5 mg Oral Daily Greer Pickerel, MD   5 mg at 11/21/19 0900   enoxaparin (LOVENOX) injection 30 mg  30 mg Subcutaneous BID Meuth, Brooke A, PA-C   30 mg at 11/21/19 0900   fentaNYL (SUBLIMAZE) injection 12.5-25 mcg  12.5-25 mcg Intravenous Q2H PRN Greer Pickerel, MD   25 mcg at 11/19/19 2142   lidocaine (LIDODERM) 5 % 1 patch  1 patch Transdermal Q24H Greer Pickerel, MD   1 patch at 11/21/19 0846   melatonin tablet 5 mg  5 mg Oral QHS PRN Meuth, Brooke A, PA-C   5 mg at 11/20/19 2027   memantine (NAMENDA) tablet 5 mg  5 mg Oral Daily Greer Pickerel, MD   5 mg at 11/21/19 0900   methocarbamol (ROBAXIN) tablet 500 mg  500 mg Oral Q8H Meuth, Brooke A, PA-C   500 mg at 11/21/19 9150   metoprolol  succinate (TOPROL-XL) 24 hr tablet 100 mg  100 mg Oral Daily Greer Pickerel, MD   100 mg at 11/21/19 0900   mupirocin ointment (BACTROBAN) 2 % 1 application  1 application Nasal BID Greer Pickerel, MD   1 application at 41/36/43 0900   ondansetron (ZOFRAN-ODT) disintegrating tablet 4 mg  4 mg Oral Q6H PRN Greer Pickerel, MD       Or   ondansetron [facility]) injection 4 mg  4 mg Intravenous Q6H PRN Greer Pickerel, MD       polyethylene glycol (MIRALAX / GLYCOLAX) packet 17 g  17 g Oral BID Meuth, Brooke A, PA-C   17 g at 11/21/19 0900   sertraline (ZOLOFT) tablet 100 mg  100 mg Oral Daily Greer Pickerel, MD   100 mg at 11/21/19 0900   traMADol (ULTRAM) tablet 25-50 mg  25-50 mg Oral Q6H PRN Meuth, Brooke A, PA-C   50 mg at 11/21/19 0840     Discharge Medications: Please see discharge summary for a list of discharge medications.  Relevant Imaging Results:  Relevant Lab Results:   Additional Information SSN: [id number]  Bethann Berkshire, LCSW

## 2019-11-21 NOTE — Progress Notes (Signed)
[facility] Progress Note     Subjective: CC-  Daughter at bedside.  Continues to have some left sided chest pain. Just received some tramadol which seems to be helping. Denies SOB. Pulling 750 on IS. O2 sats stable on room air. Chest tube with 250cc output last 24hr. Tolerating diet. Denies abdominal pain. No BM since admission.  Objective: Vital signs in last 24 hours: Temp:  [97.6 F (36.4 C)-98.6 F (37 C)] 97.8 F (36.6 C) (09/23 0733) Pulse Rate:  [52-62] 57 (09/23 0733) Resp:  [16-18] 16 (09/23 0733) BP: (119-137)/(53-66) 119/65 (09/23 0733) SpO2:  [91 %-96 %] 96 % (09/23 0733)    Intake/Output from previous day: 09/22 0701 - 09/23 0700 In: 840 [P.O.:840] Out: 850 [Urine:600; Chest Tube:250] Intake/Output this shift: No intake/output data recorded.  PE: Gen: Alert, NAD Card: RRR Pulm: CTAB, no W/R/R, rate and effort normal onroom air, CT in place without airleak - 250cc output last 24 hr. Pulling 750 on IS. Abd: Soft, NT/ND, +BS Psych:alert, oriented only to self Skin: no rashes noted, warm and dry    Lab Results:  Recent Labs    11/19/19 0456 11/20/19 0947  WBC 5.4 3.8*  HGB 8.5* 9.3*  HCT 26.7* 29.1*  PLT 143* 190   BMET Recent Labs    11/19/19 0456 11/20/19 0947  NA 138 137  K 4.2 3.7  CL 108 104  CO2 25 25  GLUCOSE 102* 130*  BUN 21 15  CREATININE 1.01 1.16  CALCIUM 8.7* 8.6*   PT/INR No results for input(s): LABPROT, INR in the last 72 hours. CMP     Component Value Date/Time   NA 137 11/20/2019 0947   K 3.7 11/20/2019 0947   CL 104 11/20/2019 0947   CO2 25 11/20/2019 0947   GLUCOSE 130 (H) 11/20/2019 0947   BUN 15 11/20/2019 0947   CREATININE 1.16 11/20/2019 0947   CALCIUM 8.6 (L) 11/20/2019 0947   PROT 6.6 02/01/2018 0151   ALBUMIN 3.0 (L) 02/01/2018 0151   AST 20 02/01/2018 0151   ALT 15 02/01/2018 0151   ALKPHOS 61 02/01/2018 0151   BILITOT 0.7 02/01/2018 0151   GFRNONAA 58 (L) 11/20/2019 0947   GFRAA  >60 11/20/2019 0947   Lipase  No results found for: LIPASE     Studies/Results: DG CHEST PORT 1 VIEW  Result Date: 11/21/2019 CLINICAL DATA:  Chest tube placement EXAM: PORTABLE CHEST 1 VIEW COMPARISON:  11/20/2019 FINDINGS: Lung volumes are extremely small. Left basilar chest tube is unchanged. Retrocardiac atelectasis or infiltrate persists. No significant pleural fluid is identified. No pneumothorax. Cardiac size is within normal limits when accounting for poor pulmonary insufflation. Multiple acute left rib fractures are identified. IMPRESSION: Stable left basilar chest tube. No pleural fluid or pneumothorax. Mild retrocardiac atelectasis or infiltrate. Electronically Signed   By: Fidela Salisbury MD   On: 11/21/2019 06:03   DG CHEST PORT 1 VIEW  Result Date: 11/20/2019 CLINICAL DATA:  Falls, rib fractures, hemothorax, left chest tube EXAM: PORTABLE CHEST 1 VIEW COMPARISON:  11/19/2019 FINDINGS: Stable left lower chest pigtail type chest tube. Similar low lung volumes with lingula and left lower lobe collapse/consolidation. Difficult to exclude small residual left effusion. No right pleural effusion. No pneumothorax. Aorta atherosclerotic. Degenerative changes of the spine. Posterolateral left rib fractures better demonstrated on the prior studies. IMPRESSION: Stable left chest tube. No significant pneumothorax by plain radiography Similar low lung volumes and dense left lower lung collapse/consolidation. Electronically Signed   By:  M.  Shick M.D.   On: 11/20/2019 08:09    Anti-infectives: Anti-infectives (From admission, onward)   None       Assessment/Plan Fall L8-9rib fxswith HTX- s/p chest tube,keep on water seal today and repeat CXR in AM. Output too high for chest tube to be removed. pulm toilet, IS/flutter valve AKI - resolved ABL anemia- hgb 9.3 (9/22), stable Alzheimer's- home meds HTN - home metoprolol Code status DNR  ID -none FEN -d/c IVF, reg diet VTE  -SCDs,lovenox Foley -none Follow up- trauma clinic  Plan-ContinuePT/OT.Continue chest tube to water seal and repeat CXR in AM. 1/2 bottle mag citrate. TOC team following for SNF placement.     LOS: 4 days    [facility] 11/21/2019, 9:21 AM Please see Amion for pager number during day hours 7:00am-4:30pm

## 2019-11-22 ENCOUNTER — Inpatient Hospital Stay (HOSPITAL_COMMUNITY): Payer: Medicare Other

## 2019-11-22 LAB — BASIC METABOLIC PANEL
Anion gap: 10 (ref 5–15)
BUN: 16 mg/dL (ref 8–23)
CO2: 28 mmol/L (ref 22–32)
Calcium: 8.6 mg/dL — ABNORMAL LOW (ref 8.9–10.3)
Chloride: 99 mmol/L (ref 98–111)
Creatinine, Ser: 1.25 mg/dL — ABNORMAL HIGH (ref 0.61–1.24)
GFR calc Af Amer: >60 mL/min (ref >60)
GFR calc non Af Amer: 53 mL/min — ABNORMAL LOW (ref >60)
Glucose, Bld: 101 mg/dL — ABNORMAL HIGH (ref 70–99)
Potassium: 4.6 mmol/L (ref 3.5–5.1)
Sodium: 137 mmol/L (ref 135–145)

## 2019-11-22 LAB — MAGNESIUM: Magnesium: 2.6 mg/dL — ABNORMAL HIGH (ref 1.7–2.4)

## 2019-11-22 LAB — CBC
HCT: 29.1 % — ABNORMAL LOW (ref 39.0–52.0)
Hemoglobin: 9.4 g/dL — ABNORMAL LOW (ref 13.0–17.0)
MCH: 32.1 pg (ref 26.0–34.0)
MCHC: 32.3 g/dL (ref 30.0–36.0)
MCV: 99.3 fL (ref 80.0–100.0)
Platelets: 217 10*3/uL (ref 150–400)
RBC: 2.93 MIL/uL — ABNORMAL LOW (ref 4.22–5.81)
RDW: 13.8 % (ref 11.5–15.5)
WBC: 8.7 10*3/uL (ref 4.0–10.5)
nRBC: 0 % (ref 0.0–0.2)

## 2019-11-22 MED ORDER — FENTANYL CITRATE (PF) 100 MCG/2ML IJ SOLN
12.5000 ug | INTRAMUSCULAR | Status: DC | PRN
  Administered 2019-11-24 (×2): 12.5 ug via INTRAVENOUS
  Filled 2019-11-22 (×2): qty 2

## 2019-11-22 MED ORDER — BISACODYL 10 MG RE SUPP
10.0000 mg | Freq: Once | RECTAL | Status: AC
  Administered 2019-11-22: 10 mg via RECTAL
  Filled 2019-11-22: qty 1

## 2019-11-22 NOTE — Plan of Care (Signed)
  Problem: Education: Goal: Knowledge of General Education information will improve Description: Including pain rating scale, medication(s)/side effects and non-pharmacologic comfort measures Outcome: Progressing   Problem: Clinical Measurements: Goal: Respiratory complications will improve Outcome: Progressing  Suction set up at bedside as patient has pain when coughing, patient able to clear secretions independently. Patient clearing 20 ml of clear yellow secretions, no signs of respiratory distress. Evette Cristal notified.

## 2019-11-22 NOTE — Progress Notes (Signed)
[facility] Progress Note     Subjective: CC-  Daughter at bedside. Continues to have some left sided chest wall pain. Patient vomited once yesterday with concern for aspiration. He denies cough or worsening SOB. O2 sats 100% on 2L [place]. Denies any abdominal pain or further nausea. No BM since admission. CXR reports possible moderate left medial pneumothorax, persistent left base atelectasis/consolidation. CT with 50cc output last 24 hours.  Objective: Vital signs in last 24 hours: Temp:  [97.8 F (36.6 C)-98.9 F (37.2 C)] 98.3 F (36.8 C) (09/24 0300) Pulse Rate:  [51-77] 56 (09/24 0929) Resp:  [15-24] 16 (09/24 0741) BP: (108-116)/(54-64) 116/54 (09/24 0741) SpO2:  [93 %-100 %] 100 % (09/24 0741) Last BM Date: 11/18/19  Intake/Output from previous day: 09/23 0701 - 09/24 0700 In: 480 [P.O.:480] Out: 650 [Urine:600; Chest Tube:50] Intake/Output this shift: No intake/output data recorded.  PE: Gen: Alert, NAD Card: RRR Pulm: CTAB, no W/R/R, rate and effort normal on 2L [place], CT in place without airleak- 50cc output last 24 hr. Pulling 600 on IS. Abd: Soft, NT/ND, +BS Psych:alert, oriented only to self Skin: no rashes noted, warm and dry    Lab Results:  Recent Labs    11/20/19 0947  WBC 3.8*  HGB 9.3*  HCT 29.1*  PLT 190   BMET Recent Labs    11/20/19 0947  NA 137  K 3.7  CL 104  CO2 25  GLUCOSE 130*  BUN 15  CREATININE 1.16  CALCIUM 8.6*   PT/INR No results for input(s): LABPROT, INR in the last 72 hours. CMP     Component Value Date/Time   NA 137 11/20/2019 0947   K 3.7 11/20/2019 0947   CL 104 11/20/2019 0947   CO2 25 11/20/2019 0947   GLUCOSE 130 (H) 11/20/2019 0947   BUN 15 11/20/2019 0947   CREATININE 1.16 11/20/2019 0947   CALCIUM 8.6 (L) 11/20/2019 0947   PROT 6.6 02/01/2018 0151   ALBUMIN 3.0 (L) 02/01/2018 0151   AST 20 02/01/2018 0151   ALT 15 02/01/2018 0151   ALKPHOS 61 02/01/2018 0151   BILITOT 0.7 02/01/2018  0151   GFRNONAA 58 (L) 11/20/2019 0947   GFRAA >60 11/20/2019 0947   Lipase  No results found for: LIPASE     Studies/Results: DG CHEST PORT 1 VIEW  Result Date: 11/22/2019 CLINICAL DATA:  Chest tube.  History of rib fractures. EXAM: PORTABLE CHEST 1 VIEW COMPARISON:  11/21/2019.  CT 11/17/2019. FINDINGS: Left chest tube in stable position. Moderate left medial pneumothorax cannot be excluded. Follow-up chest x-rays suggested to continue to evaluate. Persistent bilateral interstitial prominence and dense left base atelectasis/consolidation. Persistent small left pleural effusion. Left lower rib fractures best identified by prior CT. IMPRESSION: 1. Left chest tube in stable position. Moderate left medial pneumothorax cannot be excluded. Follow-up chest x-ray suggested to continue to evaluate. 2. Persistent bilateral interstitial prominence and dense left base atelectasis/consolidation. Persistent small left pleural effusion. 3.  Left lower rib fractures best identified by prior CT. Critical Value/emergent results were called by telephone at the time of interpretation on 11/22/2019 at 8:05 am to nurse Roj, who verbally acknowledged these results. Electronically Signed   By: Marcello Moores  Register   On: 11/22/2019 08:07   DG CHEST PORT 1 VIEW  Result Date: 11/21/2019 CLINICAL DATA:  Chest tube placement EXAM: PORTABLE CHEST 1 VIEW COMPARISON:  11/20/2019 FINDINGS: Lung volumes are extremely small. Left basilar chest tube is unchanged. Retrocardiac atelectasis or infiltrate persists.  No significant pleural fluid is identified. No pneumothorax. Cardiac size is within normal limits when accounting for poor pulmonary insufflation. Multiple acute left rib fractures are identified. IMPRESSION: Stable left basilar chest tube. No pleural fluid or pneumothorax. Mild retrocardiac atelectasis or infiltrate. Electronically Signed   By: Fidela Salisbury MD   On: 11/21/2019 06:03    Anti-infectives: Anti-infectives (From  admission, onward)   None       Assessment/Plan Fall L8-9rib fxswith HTX- s/p chest tube, placed to water seal 9/21, CXR today reports possible moderate PNX, output down to 50cc/24hr. Return to -20. pulm toilet, IS/flutter valve AKI -resolved ABL anemia-hgb 9.3 (9/22), stable Alzheimer's- home meds HTN - home metoprolol Code status DNR  ID -none FEN - reg diet VTE -SCDs,lovenox Foley -none Follow up- trauma clinic  Plan-Labs pending. Chest tube back to -20, will review film with MD. ContinuePT/OT.dulcolax suppository for constipation. TOC team following for SNF placement.   LOS: 5 days    [facility] 11/22/2019, 9:36 AM Please see Amion for pager number during day hours 7:00am-4:30pm

## 2019-11-22 NOTE — Plan of Care (Signed)
  Problem: Health Behavior/Discharge Planning: Goal: Ability to manage health-related needs will improve Outcome: Progressing   Problem: Clinical Measurements: Goal: Will remain free from infection Outcome: Progressing   Problem: Nutrition: Goal: Adequate nutrition will be maintained Outcome: Progressing

## 2019-11-22 NOTE — Progress Notes (Signed)
PT Cancellation Note  Patient Details Name: Joel Ibarra. MRN: [id number] DOB: February 18, 1935   Cancelled Treatment:    Reason Eval/Treat Not Completed: (P) Medical issues which prohibited therapy Per RN, possible pneumothorax on imaging, will defer at this time.   Kara Pacer Fern Asmar 11/22/2019, 1:41 PM

## 2019-11-23 ENCOUNTER — Inpatient Hospital Stay (HOSPITAL_COMMUNITY): Payer: Medicare Other

## 2019-11-23 LAB — BASIC METABOLIC PANEL
Anion gap: 8 (ref 5–15)
BUN: 17 mg/dL (ref 8–23)
CO2: 25 mmol/L (ref 22–32)
Calcium: 8.4 mg/dL — ABNORMAL LOW (ref 8.9–10.3)
Chloride: 102 mmol/L (ref 98–111)
Creatinine, Ser: 1.1 mg/dL (ref 0.61–1.24)
GFR calc Af Amer: >60 mL/min (ref >60)
GFR calc non Af Amer: >60 mL/min (ref >60)
Glucose, Bld: 103 mg/dL — ABNORMAL HIGH (ref 70–99)
Potassium: 3.8 mmol/L (ref 3.5–5.1)
Sodium: 135 mmol/L (ref 135–145)

## 2019-11-23 NOTE — Plan of Care (Signed)
  Problem: Education: Goal: Knowledge of General Education information will improve Description: Including pain rating scale, medication(s)/side effects and non-pharmacologic comfort measures Outcome: Progressing   Problem: Clinical Measurements: Goal: Will remain free from infection Outcome: Progressing   Problem: Activity: Goal: Risk for activity intolerance will decrease Outcome: Progressing   Problem: Nutrition: Goal: Adequate nutrition will be maintained Outcome: Progressing   Problem: Coping: Goal: Level of anxiety will decrease Outcome: Progressing   Problem: Pain Managment: Goal: General experience of comfort will improve Outcome: Progressing   

## 2019-11-23 NOTE — Progress Notes (Signed)
Physical Therapy Treatment Patient Details Name: Joel Ibarra. MRN: [id number] DOB: January 12, 1935 Today's Date: 11/23/2019    History of Present Illness Joel Ibarra. is an 85 y.o. male who is here for evaluation and management of a large left hemothorax and rib fractures after sustaining a fall.  Patient has Alzheimer's and lives in a memory care unit.  He reportedly fell on Friday.  He was evaluated at the memory care unit and there was no apparent injuries.  On Saturday afternoon the staff noticed that he had some blue nailbeds, was complaining of pain, clammy, diaphoretic and he was taken to [facility].  He was evaluated.  CT of the chest revealed 2 rib fractures 1 of which was in multiple locations as well as a large left pleural effusion consistent with a hemothorax.  He did require 4 L of oxygen and placement of chest tube.  The daughter requested transfer to [facility] for management.  He was found also to have a mild acute kidney injury     PT Comments    The pt is making good progress with therapy goals at this time, he was able to progress to complete a short ambulation in his room with assist of 1 for balance and chair follow for safety. He reports no pain with this ambulation, and all vitals remained stable. However, the pt continues require significant assist to complete bed mobility and assist to stand/steady even with use of RW. The pt will continue to benefit from skilled PT to progress functional strength and activity tolerance prior to d/c.     Follow Up Recommendations  SNF;Supervision/Assistance - 24 hour     Equipment Recommendations  None recommended by PT    Recommendations for Other Services       Precautions / Restrictions Precautions Precautions: Fall;Other (comment) Precaution Comments: chest tube Restrictions Weight Bearing Restrictions: No    Mobility  Bed Mobility Overal bed mobility: Needs Assistance Bed  Mobility: Supine to Sit     Supine to sit: Mod assist;HOB elevated     General bed mobility comments: modA to BLE and to trunk to come to sitting EOB, no assist to steady once in sitting  Transfers Overall transfer level: Needs assistance Equipment used: Rolling walker (2 wheeled) Transfers: Sit to/from Stand Sit to Stand: Min assist Stand pivot transfers: Min assist       General transfer comment: minA from EOB, but minG from recliner with cues to use armrests of chair. significant cues for lower to recliner and hand positioning`  Ambulation/Gait Ambulation/Gait assistance: Min guard Gait Distance (Feet): 20 Feet Assistive device: Rolling walker (2 wheeled) Gait Pattern/deviations: Step-to pattern;Trunk flexed Gait velocity: decreased Gait velocity interpretation: <1.31 ft/sec, indicative of household ambulator General Gait Details: pt ambulated to door of room with chair follow for safety. no SOB, SpO2 in 90s on 3L. cues for positioning in RW      Balance Overall balance assessment: Needs assistance;History of Falls Sitting-balance support: No upper extremity supported;Feet supported Sitting balance-Leahy Scale: Fair Sitting balance - Comments: limited for challenges due to rib pain Postural control: Posterior lean Standing balance support: Bilateral upper extremity supported;During functional activity Standing balance-Leahy Scale: Poor Standing balance comment: reliant on B UE support or external support from therapist                            Cognition Arousal/Alertness: Awake/alert Behavior During Therapy: [facility]  for tasks assessed/performed Overall Cognitive Status: History of cognitive impairments - at baseline Area of Impairment: Orientation;Memory;Safety/judgement;Following commands                 Orientation Level: Time;Situation;Place;Disoriented to   Memory: Decreased short-term memory;Decreased recall of precautions Following Commands:  Follows one step commands with increased time Safety/Judgement: Decreased awareness of safety;Decreased awareness of deficits Awareness: Emergent Problem Solving: Slow processing;Decreased initiation;Difficulty sequencing;Requires verbal cues;Requires tactile cues General Comments: followed one-step commands well through session. disoriented to situation and asking some unrelated questions through session      Exercises General Exercises - Lower Extremity Long Arc Quad: Strengthening;Both;10 reps;Seated (against mod/max resistance) Heel Slides: Strengthening;Seated;Both;10 reps (against mod/max resistance) Hip Flexion/Marching: AROM;Both;10 reps;Seated    General Comments General comments (skin integrity, edema, etc.): daughter present and supportive      Pertinent Vitals/Pain Pain Assessment: No/denies pain Pain Intervention(s): Limited activity within patient's tolerance;Monitored during session           PT Goals (current goals can now be found in the care plan section) Acute Rehab PT Goals Patient Stated Goal: get better PT Goal Formulation: With patient/family Time For Goal Achievement: 12/02/19 Potential to Achieve Goals: Good Progress towards PT goals: Progressing toward goals    Frequency    Min 3X/week      PT Plan Current plan remains appropriate       AM-PAC PT "6 Clicks" Mobility   Outcome Measure  Help needed turning from your back to your side while in a flat bed without using bedrails?: A Lot Help needed moving from lying on your back to sitting on the side of a flat bed without using bedrails?: A Lot Help needed moving to and from a bed to a chair (including a wheelchair)?: A Little Help needed standing up from a chair using your arms (e.g., wheelchair or bedside chair)?: A Little Help needed to walk in hospital room?: A Little Help needed climbing 3-5 steps with a railing? : Total 6 Click Score: 14    End of Session Equipment Utilized During  Treatment: Oxygen;Gait belt (3L O2) Activity Tolerance: Patient limited by pain Patient left: in chair;with chair alarm set;with family/visitor present;with call bell/phone within reach Nurse Communication: Mobility status PT Visit Diagnosis: Unsteadiness on feet (R26.81);Muscle weakness (generalized) (M62.81);Difficulty in walking, not elsewhere classified (R26.2);Pain Pain - Right/Left: Left Pain - part of body:  (chest)     Time: 0174-9449 PT Time Calculation (min) (ACUTE ONLY): 32 min  Charges:  $Gait Training: 8-22 mins $Therapeutic Exercise: 8-22 mins                     Karma Ganja, PT, DPT   Acute Rehabilitation Department Pager #: [phone]   Otho Bellows 11/23/2019, 4:48 PM

## 2019-11-23 NOTE — Progress Notes (Signed)
Occupational Therapy Treatment Patient Details Name: Joel Ibarra. MRN: [id number] DOB: May 02, 1934 Today's Date: 11/23/2019    History of present illness Joel Kroft. is an 85 y.o. male who is here for evaluation and management of a large left hemothorax and rib fractures after sustaining a fall.  Patient has Alzheimer's and lives in a memory care unit.  He reportedly fell on Friday.  He was evaluated at the memory care unit and there was no apparent injuries.  On Saturday afternoon the staff noticed that he had some blue nailbeds, was complaining of pain, clammy, diaphoretic and he was taken to [facility].  He was evaluated.  CT of the chest revealed 2 rib fractures 1 of which was in multiple locations as well as a large left pleural effusion consistent with a hemothorax.  He did require 4 L of oxygen and placement of chest tube.  The daughter requested transfer to [facility] for management.  He was found also to have a mild acute kidney injury    OT comments  Pt. Was seen for skilled OT to maximize I and safety with ADLs and ADL transfers. Pt. Needed 1 step directions with verbal, tactile and visual cues. Pt. Was agreeable to therapy and is motivated to work. Pt. Was transferred to chair at [facility]. Daughter ed on patient participation with OT.   Follow Up Recommendations  SNF;Supervision/Assistance - 24 hour    Equipment Recommendations  None recommended by OT    Recommendations for Other Services      Precautions / Restrictions Precautions Precautions: Fall;Other (comment) Precaution Comments: chest tube Restrictions Weight Bearing Restrictions: No       Mobility Bed Mobility   Bed Mobility: Supine to Sit     Supine to sit: Max assist;HOB elevated        Transfers Overall transfer level: Needs assistance Equipment used: Rolling walker (2 wheeled) Transfers: Sit to/from Stand Sit to Stand: Min assist Stand pivot transfers:  Min assist       General transfer comment: Pt. needs cues to move feet for transfer    Balance     Sitting balance-Leahy Scale: Fair       Standing balance-Leahy Scale: Poor                             ADL either performed or assessed with clinical judgement   ADL       Grooming: Minimal assistance;Sitting       Lower Body Bathing: Minimal assistance;Sit to/from stand   Upper Body Dressing : Minimal assistance;Sitting   Lower Body Dressing: Minimal assistance;Sit to/from stand   Toilet Transfer: Minimal assistance;Stand-pivot;BSC;RW   Toileting- Clothing Manipulation and Hygiene: Minimal assistance;Sit to/from stand;Sitting/lateral lean       Functional mobility during ADLs: Minimal assistance;Cueing for safety;Cueing for sequencing;Rolling walker General ADL Comments: Pt. performed ADLs sitting EOB pnt needed verbal, tactile and verbal cues.      Vision   Vision Assessment?: No apparent visual deficits   Perception     Praxis      Cognition Arousal/Alertness: Awake/alert Behavior During Therapy: WFL for tasks assessed/performed Overall Cognitive Status: History of cognitive impairments - at baseline Area of Impairment: Orientation;Memory;Safety/judgement;Following commands                 Orientation Level: Time;Situation;Place   Memory: Decreased short-term memory;Decreased recall of precautions Following Commands: Follows one step commands with  increased time Safety/Judgement: Decreased awareness of safety;Decreased awareness of deficits Awareness: Emergent Problem Solving: Slow processing;Decreased initiation;Difficulty sequencing;Requires verbal cues;Requires tactile cues General Comments: followed one step commands. Keeps thinking he has a meeting he needs to get to, a bit anxious        Exercises     Shoulder Instructions       General Comments Pt. has chest tube    Pertinent Vitals/ Pain       Pain Assessment:  0-10 Pain Score: 2  Pain Location: l ribs Pain Descriptors / Indicators: Aching;Discomfort Pain Intervention(s): Limited activity within patient's tolerance  Home Living                                          Prior Functioning/Environment              Frequency  Min 2X/week        Progress Toward Goals  OT Goals(current goals can now be found in the care plan section)  Progress towards OT goals: Progressing toward goals  Acute Rehab OT Goals Patient Stated Goal: get better OT Goal Formulation: With patient Time For Goal Achievement: 12/02/19 Potential to Achieve Goals: Good ADL Goals Pt Will Perform Grooming: with modified independence;standing Pt Will Perform Lower Body Bathing: with supervision;sit to/from stand;sitting/lateral leans Pt Will Perform Lower Body Dressing: with set-up;sit to/from stand;sitting/lateral leans Pt Will Transfer to Toilet: with modified independence;ambulating;bedside commode Pt Will Perform Toileting - Clothing Manipulation and hygiene: with modified independence;sitting/lateral leans;sit to/from stand  Plan      Co-evaluation                 AM-PAC OT "6 Clicks" Daily Activity     Outcome Measure   Help from another person eating meals?: A Little Help from another person taking care of personal grooming?: A Little Help from another person toileting, which includes using toliet, bedpan, or urinal?: A Little Help from another person bathing (including washing, rinsing, drying)?: A Little Help from another person to put on and taking off regular upper body clothing?: A Little Help from another person to put on and taking off regular lower body clothing?: A Little 6 Click Score: 18    End of Session Equipment Utilized During Treatment: Gait belt;Rolling walker;Oxygen      Activity Tolerance Patient tolerated treatment well   Patient Left in chair;with call bell/phone within reach;with chair alarm  set;with family/visitor present   Nurse Communication Mobility status;Other (comment)        Time: 1694-5038 OT Time Calculation (min): 38 min  Charges: OT General Charges $OT Visit: 1 Visit OT Treatments $Self Care/Home Management : 38-52 mins  Joel Ibarra OT/L   Joel Ibarra 11/23/2019, 9:29 AM

## 2019-11-23 NOTE — Progress Notes (Signed)
[facility] Progress Note     Subjective: -CT output 215 over the last 24 hrs, CXR stable with no change compared to yesterday. On 2L [place]. Left sided chest wall tenderness improving. Doing 1L on IS. Tolerating a diet.   Objective: Vital signs in last 24 hours: Temp:  [97.7 F (36.5 C)-98.2 F (36.8 C)] 98.2 F (36.8 C) (09/25 0807) Pulse Rate:  [56-69] 66 (09/25 0807) Resp:  [17-18] 18 (09/25 0807) BP: (110-117)/(47-66) 111/51 (09/25 0807) SpO2:  [84 %-99 %] 98 % (09/25 0807) Last BM Date: 11/23/19  Intake/Output from previous day: 09/24 0701 - 09/25 0700 In: 590 [P.O.:590] Out: 1465 [Urine:1250; Chest Tube:215] Intake/Output this shift: No intake/output data recorded.  PE: Gen: Alert, NAD Card: RRR Pulm: CTAB, rate and effort normal on 2L , CT in place without airleak, serosanguinous 215cc over the last 24 hrs Abd: Soft, NT/ND, +BS Psych:alert, oriented only to self Skin: no rashes noted, warm and dry    Lab Results:  Recent Labs    11/20/19 0947 11/22/19 1106  WBC 3.8* 8.7  HGB 9.3* 9.4*  HCT 29.1* 29.1*  PLT 190 217   BMET Recent Labs    11/22/19 1106 11/23/19 0337  NA 137 135  K 4.6 3.8  CL 99 102  CO2 28 25  GLUCOSE 101* 103*  BUN 16 17  CREATININE 1.25* 1.10  CALCIUM 8.6* 8.4*   PT/INR No results for input(s): LABPROT, INR in the last 72 hours. CMP     Component Value Date/Time   NA 135 11/23/2019 0337   K 3.8 11/23/2019 0337   CL 102 11/23/2019 0337   CO2 25 11/23/2019 0337   GLUCOSE 103 (H) 11/23/2019 0337   BUN 17 11/23/2019 0337   CREATININE 1.10 11/23/2019 0337   CALCIUM 8.4 (L) 11/23/2019 0337   PROT 6.6 02/01/2018 0151   ALBUMIN 3.0 (L) 02/01/2018 0151   AST 20 02/01/2018 0151   ALT 15 02/01/2018 0151   ALKPHOS 61 02/01/2018 0151   BILITOT 0.7 02/01/2018 0151   GFRNONAA >60 11/23/2019 0337   GFRAA >60 11/23/2019 0337   Lipase  No results found for: LIPASE     Studies/Results: DG CHEST PORT 1  VIEW  Result Date: 11/22/2019 CLINICAL DATA:  Chest tube.  History of rib fractures. EXAM: PORTABLE CHEST 1 VIEW COMPARISON:  11/21/2019.  CT 11/17/2019. FINDINGS: Left chest tube in stable position. Moderate left medial pneumothorax cannot be excluded. Follow-up chest x-rays suggested to continue to evaluate. Persistent bilateral interstitial prominence and dense left base atelectasis/consolidation. Persistent small left pleural effusion. Left lower rib fractures best identified by prior CT. IMPRESSION: 1. Left chest tube in stable position. Moderate left medial pneumothorax cannot be excluded. Follow-up chest x-ray suggested to continue to evaluate. 2. Persistent bilateral interstitial prominence and dense left base atelectasis/consolidation. Persistent small left pleural effusion. 3.  Left lower rib fractures best identified by prior CT. Critical Value/emergent results were called by telephone at the time of interpretation on 11/22/2019 at 8:05 am to nurse Roj, who verbally acknowledged these results. Electronically Signed   By: Marcello Moores  Register   On: 11/22/2019 08:07    Anti-infectives: Anti-infectives (From admission, onward)   None       Assessment/Plan Fall L8-9rib fxswith HTX- s/p chest tube, placed to water seal 9/21, CXR today reports possible moderate PNX, output down to 50cc/24hr. Return to -20. pulm toilet, IS/flutter valve AKI -resolved ABL anemia-hgb 9.3 (9/22), stable Alzheimer's- home meds HTN - home metoprolol  Code status DNR  ID -none FEN - reg diet VTE -SCDs,lovenox Foley -none Follow up- trauma clinic  Plan- CXR with PTX, continue chest tube to -20. Pain and IS effort improving.  ContinuePT/OT, has not been evaluated it yet. TOC team following for SNF placement.   LOS: 6 days    Daiva Huge, MD [facility] 11/23/2019, 8:19 AM Please see Amion for pager number during day hours 7:00am-4:30pm

## 2019-11-24 ENCOUNTER — Inpatient Hospital Stay (HOSPITAL_COMMUNITY): Payer: Medicare Other

## 2019-11-24 NOTE — Progress Notes (Signed)
[facility] Progress Note     Subjective: Self removed CT last night, interval CXR does not show evidence of PTX, there is persistent left pleural effusion. On 2L nasal [place] with sat >90%. More sleepy this morning. Evaluated it by PT yesterday, recommending SNF.  Objective: Vital signs in last 24 hours: Temp:  [97.4 F (36.3 C)-99.2 F (37.3 C)] 99.2 F (37.3 C) (09/26 0747) Pulse Rate:  [56-90] 90 (09/26 0747) Resp:  [15-19] 19 (09/26 0747) BP: (111-136)/(51-70) 111/67 (09/26 0747) SpO2:  [96 %-100 %] 100 % (09/26 0747) Last BM Date: 11/23/19  Intake/Output from previous day: 09/25 0701 - 09/26 0700 In: 120 [P.O.:120] Out: 250 [Urine:200; Chest Tube:50] Intake/Output this shift: No intake/output data recorded.  PE: Gen: Alert, NAD Card: RRR Pulm: CTAB, rate and effort normal on 2L [place] Abd: Soft, NT/ND, +BS Psych:alert, oriented only to self Skin: no rashes noted, warm and dry    Lab Results:  Recent Labs    11/22/19 1106  WBC 8.7  HGB 9.4*  HCT 29.1*  PLT 217   BMET Recent Labs    11/22/19 1106 11/23/19 0337  NA 137 135  K 4.6 3.8  CL 99 102  CO2 28 25  GLUCOSE 101* 103*  BUN 16 17  CREATININE 1.25* 1.10  CALCIUM 8.6* 8.4*   PT/INR No results for input(s): LABPROT, INR in the last 72 hours. CMP     Component Value Date/Time   NA 135 11/23/2019 0337   K 3.8 11/23/2019 0337   CL 102 11/23/2019 0337   CO2 25 11/23/2019 0337   GLUCOSE 103 (H) 11/23/2019 0337   BUN 17 11/23/2019 0337   CREATININE 1.10 11/23/2019 0337   CALCIUM 8.4 (L) 11/23/2019 0337   PROT 6.6 02/01/2018 0151   ALBUMIN 3.0 (L) 02/01/2018 0151   AST 20 02/01/2018 0151   ALT 15 02/01/2018 0151   ALKPHOS 61 02/01/2018 0151   BILITOT 0.7 02/01/2018 0151   GFRNONAA >60 11/23/2019 0337   GFRAA >60 11/23/2019 0337   Lipase  No results found for: LIPASE     Studies/Results: DG Chest Port 1 View  Result Date: 11/24/2019 CLINICAL DATA:  Recurrent  pneumothorax following chest tube removal EXAM: PORTABLE CHEST 1 VIEW COMPARISON:  Radiograph 11/23/2019 FINDINGS: Interval removal of a left pigtail pleural drain. No recurrent or residual pneumothorax is evident. A persistent small to moderate left pleural effusion is unchanged in size from comparison. Underlying atelectatic changes are present and similar to comparison as well. No new consolidative opacities are seen. Stable cardiomediastinal contours with a calcified aorta. Degenerative changes are present in the imaged spine and shoulders. Cholecystectomy clips in the right upper quadrant. No acute osseous or soft tissue abnormality. IMPRESSION: 1. No recurrent or residual pneumothorax following removal of left pigtail pleural drain. 2. Stable small to moderate left pleural effusion 3. Atelectatic changes and opacity in the lungs are similar to comparison. Electronically Signed   By: Lovena Le M.D.   On: 11/24/2019 03:00   DG CHEST PORT 1 VIEW  Result Date: 11/23/2019 CLINICAL DATA:  Follow-up pneumothorax EXAM: PORTABLE CHEST 1 VIEW COMPARISON:  11/22/2019, 11/21/2019, 11/20/2019, 11/19/2019, and CT chest from 11/17/2019 FINDINGS: Unchanged position of the indwelling left pigtail thoracostomy tube projecting over the left lower hemithorax. Interval decreased conspicuity of the previously visualized possible left medial pneumothorax. Small left residual pleural effusion, not increased from prior. No new consolidative pulmonary opacities. Similar appearance of previously visualized mildly displaced eighth and ninth posterior  left rib fractures. Surgical clips in right upper quadrant. IMPRESSION: Decreased conspicuity of previously visualized possible left medial pneumothorax. Unchanged position of the indwelling left pigtail thoracostomy tube. Similar appearing small left pleural effusion. Electronically Signed   By: Ruthann Cancer MD   On: 11/23/2019 10:10    Anti-infectives: Anti-infectives (From  admission, onward)   None       Assessment/Plan Fall L8-9rib fxswith HTX- daily CXR, may need to have CT replaced if worsening effusion or increase oxygen requirement. Encourage deep respirations. Needs to be sitting up and walking. AKI -resolved ABL anemia-hgb 9.3 (9/22), stable Alzheimer's- home meds, delirium precautions HTN - home metoprolol Code status DNR  ID -none FEN - reg diet VTE -SCDs,lovenox Foley -none Follow up- trauma clinic  Plan- delirium precautions, out of bed, continue to work with PT. TOC team following for SNF placement.   LOS: 7 days    Daiva Huge, MD [facility] 11/24/2019, 7:58 AM Please see Amion for pager number during day hours 7:00am-4:30pm

## 2019-11-24 NOTE — Plan of Care (Signed)
  Problem: Education: °Goal: Knowledge of General Education information will improve °Description: Including pain rating scale, medication(s)/side effects and non-pharmacologic comfort measures °Outcome: Progressing °  °Problem: Health Behavior/Discharge Planning: °Goal: Ability to manage health-related needs will improve °Outcome: Progressing °  °Problem: Clinical Measurements: °Goal: Will remain free from infection °Outcome: Progressing °  °Problem: Activity: °Goal: Risk for activity intolerance will decrease °Outcome: Progressing °  °Problem: Nutrition: °Goal: Adequate nutrition will be maintained °Outcome: Progressing °  °Problem: Coping: °Goal: Level of anxiety will decrease °Outcome: Progressing °  °Problem: Elimination: °Goal: Will not experience complications related to bowel motility °Outcome: Progressing °  °

## 2019-11-25 ENCOUNTER — Inpatient Hospital Stay (HOSPITAL_COMMUNITY): Payer: Medicare Other

## 2019-11-25 LAB — GLUCOSE, CAPILLARY: Glucose-Capillary: 119 mg/dL — ABNORMAL HIGH (ref 70–99)

## 2019-11-25 MED ORDER — FUROSEMIDE 10 MG/ML IJ SOLN
40.0000 mg | Freq: Once | INTRAMUSCULAR | Status: AC
  Administered 2019-11-25: 40 mg via INTRAVENOUS
  Filled 2019-11-25: qty 4

## 2019-11-25 MED ORDER — IPRATROPIUM-ALBUTEROL 0.5-2.5 (3) MG/3ML IN SOLN
3.0000 mL | Freq: Four times a day (QID) | RESPIRATORY_TRACT | Status: DC
  Administered 2019-11-25: 3 mL via RESPIRATORY_TRACT
  Filled 2019-11-25 (×2): qty 3

## 2019-11-25 MED ORDER — METHOCARBAMOL 500 MG PO TABS
750.0000 mg | ORAL_TABLET | Freq: Three times a day (TID) | ORAL | Status: DC
  Administered 2019-11-25 – 2019-11-26 (×3): 750 mg via ORAL
  Filled 2019-11-25 (×3): qty 2

## 2019-11-25 NOTE — Discharge Instructions (Signed)
PNEUMOTHORAX OR HEMOTHORAX +/- RIB FRACTURES  HOME INSTRUCTIONS   1. PAIN CONTROL:  1. Pain is best controlled by a usual combination of three different methods TOGETHER:  i. Ice/Heat ii. Over the counter pain medication iii. Prescription pain medication 2. You may experience some swelling and bruising in area of broken ribs. Ice packs or heating pads (30-60 minutes up to 6 times a day) will help. Use ice for the first few days to help decrease swelling and bruising, then switch to heat to help relax tight/sore spots and speed recovery. Some people prefer to use ice alone, heat alone, alternating between ice & heat. Experiment to what works for you. Swelling and bruising can take several weeks to resolve.  3. It is helpful to take an over-the-counter pain medication regularly for the first few weeks. Choose one of the following that works best for you:  i. Naproxen (Aleve, etc) Two 220mg tabs twice a day ii. Ibuprofen (Advil, etc) Three 200mg tabs four times a day (every meal & bedtime) iii. Acetaminophen (Tylenol, etc) 500-650mg four times a day (every meal & bedtime) 4. A prescription for pain medication (such as oxycodone, hydrocodone, etc) may be given to you upon discharge. Take your pain medication as prescribed.  i. If you are having problems/concerns with the prescription medicine (does not control pain, nausea, vomiting, rash, itching, etc), please call us [phone] to see if we need to switch you to a different pain medicine that will work better for you and/or control your side effect better. ii. If you need a refill on your pain medication, please contact your pharmacy. They will contact our office to request authorization. Prescriptions will not be filled after 5 pm or on week-ends. 1. Avoid getting constipated. When taking pain medications, it is common to experience some constipation. Increasing fluid intake and taking a fiber supplement (such as Metamucil, Citrucel, FiberCon,  MiraLax, etc) 1-2 times a day regularly will usually help prevent this problem from occurring. A mild laxative (prune juice, Milk of Magnesia, MiraLax, etc) should be taken according to package directions if there are no bowel movements after 48 hours.  2. Watch out for diarrhea. If you have many loose bowel movements, simplify your diet to bland foods & liquids for a few days. Stop any stool softeners and decrease your fiber supplement. Switching to mild anti-diarrheal medications (Kayopectate, Pepto Bismol) can help. If this worsens or does not improve, please call us. 3. Chest tube site wound: you may remove the dressing from your chest tube site 3 days after the removal of your chest tube. DO NOT shower over the dressing. Once   removed, you may shower as normal. Do not submerge your wound in water for 2-3 weeks.  4. FOLLOW UP  a. Please call our office to set up or confirm an appointment for follow up for 2 weeks after discharge. You will need to get a chest xray at either [facility] or [place]. This will be outlined in your follow up instructions. Please call [facility] at [phone] if you have any questions about follow up.  b. If you have any orthopedic or other injuries you will need to follow up as outlined in your follow up instructions.   WHEN TO CALL US [phone]:  1. Poor pain control 2. Reactions / problems with new medications (rash/itching, nausea, etc)  3. Fever over 101.5 F (38.5 C) 4. Worsening swelling or bruising 5. Redness, drainage, pain or swelling around chest   tube site 6. Worsening pain, productive cough, difficulty breathing or any other concerning symptoms  The clinic staff is available to answer your questions during regular business hours (8:30am-5pm). Please don't hesitate to call and ask to speak to one of our nurses for clinical concerns.  If you have a medical emergency, go to the nearest emergency room or call 911.  A surgeon from [facility] is always on call at the hospitals   [facility]  [address], [address], [place], [place] [address] ?  MAIN: [phone] ? TOLL FREE: [phone] ?  FAX [phone]  [email or web address]      Information on Rib Fractures  A rib fracture is a break or crack in one of the bones of the ribs. The ribs are long, curved bones that wrap around your chest and attach to your spine and your breastbone. The ribs protect your heart, lungs, and other organs in the chest. A broken or cracked rib is often painful but is not usually serious. Most rib fractures heal on their own over time. However, rib fractures can be more serious if multiple ribs are broken or if broken ribs move out of place and push against other structures or organs. What are the causes? This condition is caused by:  Repetitive movements with high force, such as pitching a baseball or having severe coughing spells.  A direct blow to the chest, such as a sports injury, a car accident, or a fall.  Cancer that has spread to the bones, which can weaken bones and cause them to break. What are the signs or symptoms? Symptoms of this condition include:  Pain when you breathe in or cough.  Pain when someone presses on the injured area.  Feeling short of breath. How is this diagnosed? This condition is diagnosed with a physical exam and medical history. Imaging tests may also be done, such as:  Chest X-ray.  CT scan.  MRI.  Bone scan.  Chest ultrasound. How is this treated? Treatment for this condition depends on the severity of the fracture. Most rib fractures usually heal on their own in 1-3 months. Sometimes healing takes longer if there is a cough that does not stop or if there are other activities that make the injury worse (aggravating factors). While you heal, you will be given medicines to control the pain. You will also be taught deep breathing  exercises. Severe injuries may require hospitalization or surgery. Follow these instructions at home: Managing pain, stiffness, and swelling  If directed, apply ice to the injured area. ? Put ice in a plastic bag. ? Place a towel between your skin and the bag. ? Leave the ice on for 20 minutes, 2-3 times a day.  Take over-the-counter and prescription medicines only as told by your health care provider. Activity  Avoid a lot of activity and any activities or movements that cause pain. Be careful during activities and avoid bumping the injured rib.  Slowly increase your activity as told by your health care provider. General instructions  Do deep breathing exercises as told by your health care provider. This helps prevent pneumonia, which is a common complication of a broken rib. Your health care provider may instruct you to: ? Take deep breaths several times a day. ? Try to cough several times a day, holding a pillow against the injured area. ? Use a device called incentive spirometer to practice deep breathing several times a day.  Drink enough   fluid to keep your urine pale yellow.  Do not wear a rib belt or binder. These restrict breathing, which can lead to pneumonia.  Keep all follow-up visits as told by your health care provider. This is important. Contact a health care provider if:  You have a fever. Get help right away if:  You have difficulty breathing or you are short of breath.  You develop a cough that does not stop, or you cough up thick or bloody sputum.  You have nausea, vomiting, or pain in your abdomen.  Your pain gets worse and medicine does not help. Summary  A rib fracture is a break or crack in one of the bones of the ribs.  A broken or cracked rib is often painful but is not usually serious.  Most rib fractures heal on their own over time.  Treatment for this condition depends on the severity of the fracture.  Avoid a lot of activity and any  activities or movements that cause pain. This information is not intended to replace advice given to you by your health care provider. Make sure you discuss any questions you have with your health care provider. Document Released: 02/14/2005 Document Revised: 05/16/2016 Document Reviewed: 05/16/2016 Elsevier Interactive Patient Education  2019 Elsevier Inc.    Pneumothorax A pneumothorax is commonly called a collapsed lung. It is a condition in which air leaks from a lung and builds up between the thin layer of tissue that covers the lungs (visceral pleura) and the interior wall of the chest cavity (parietal pleura). The air gets trapped outside the lung, between the lung and the chest wall (pleural space). The air takes up space and prevents the lung from fully expanding. This condition sometimes occurs suddenly with no apparent cause. The buildup of air may be small or large. A small pneumothorax may go away on its own. A large pneumothorax will require treatment and hospitalization. What are the causes? This condition may be caused by:  Trauma and injury to the chest wall.  Surgery and other medical procedures.  A complication of an underlying lung problem, especially chronic obstructive pulmonary disease (COPD) or emphysema. Sometimes the cause of this condition is not known. What increases the risk? You are more likely to develop this condition if:  You have an underlying lung problem.  You smoke.  You are 20-40 years old, male, tall, and underweight.  You have a personal or family history of pneumothorax.  You have an eating disorder (anorexia nervosa). This condition can also happen quickly, even in people with no history of lung problems. What are the signs or symptoms? Sometimes a pneumothorax will have no symptoms. When symptoms are present, they can include:  Chest pain.  Shortness of breath.  Increased rate of breathing.  Bluish color to your lips or skin  (cyanosis). How is this diagnosed? This condition may be diagnosed by:  A medical history and physical exam.  A chest X-ray, chest CT scan, or ultrasound. How is this treated? Treatment depends on how severe your condition is. The goal of treatment is to remove the extra air and allow your lung to expand back to its normal size.  For a small pneumothorax: ? No treatment may be needed. ? Extra oxygen is sometimes used to make it go away more quickly.  For a large pneumothorax or a pneumothorax that is causing symptoms, a procedure is done to drain the air from your lungs. To do this, a health care provider may   use: ? A needle with a syringe. This is used to suck air from a pleural space where no additional leakage is taking place. ? A chest tube. This is used to suck air where there is ongoing leakage into the pleural space. The chest tube may need to remain in place for several days until the air leak has healed.  In more severe cases, surgery may be needed to repair the damage that is causing the leak.  If you have multiple pneumothorax episodes or have an air leak that will not heal, a procedure called a pleurodesis may be done. A medicine is placed in the pleural space to irritate the tissues around the lung so that the lung will stick to the chest wall, seal any leaks, and stop any buildup of air in that space. If you have an underlying lung problem, severe symptoms, or a large pneumothorax you will usually need to stay in the hospital. Follow these instructions at home: Lifestyle  Do not use any products that contain nicotine or tobacco, such as cigarettes and e-cigarettes. These are major risk factors in pneumothorax. If you need help quitting, ask your health care provider.  Do not lift anything that is heavier than 10 lb (4.5 kg), or the limit that your health care provider tells you, until he or she says that it is safe.  Avoid activities that take a lot of effort (strenuous)  for as long as told by your health care provider.  Return to your normal activities as told by your health care provider. Ask your health care provider what activities are safe for you.  Do not fly in an airplane or scuba dive until your health care provider says it is okay. General instructions  Take over-the-counter and prescription medicines only as told by your health care provider.  If a cough or pain makes it difficult for you to sleep at night, try sleeping in a semi-upright position in a recliner or by using 2 or 3 pillows.  If you had a chest tube and it was removed, ask your health care provider when you can remove the bandage (dressing). While the dressing is in place, do not allow it to get wet.  Keep all follow-up visits as told by your health care provider. This is important. Contact a health care provider if:  You cough up thick mucus (sputum) that is yellow or green in color.  You were treated with a chest tube, and you have redness, increasing pain, or discharge at the site where it was placed. Get help right away if:  You have increasing chest pain or shortness of breath.  You have a cough that will not go away.  You begin coughing up blood.  You have pain that is getting worse or is not controlled with medicines.  The site where your chest tube was located opens up.  You feel air coming out of the site where the chest tube was placed.  You have a fever or persistent symptoms for more than 2-3 days.  You have a fever and your symptoms suddenly get worse. These symptoms may represent a serious problem that is an emergency. Do not wait to see if the symptoms will go away. Get medical help right away. Call your local emergency services (911 in the [place] Do not drive yourself to the hospital. Summary  A pneumothorax, commonly called a collapsed lung, is a condition in which air leaks from a lung and gets trapped between the   lung and the chest wall (pleural  space).  The buildup of air may be small or large. A small pneumothorax may go away on its own. A large pneumothorax will require treatment and hospitalization.  Treatment for this condition depends on how severe the pneumothorax is. The goal of treatment is to remove the extra air and allow the lung to expand back to its normal size. This information is not intended to replace advice given to you by your health care provider. Make sure you discuss any questions you have with your health care provider. Document Released: 02/14/2005 Document Revised: 01/23/2017 Document Reviewed: 01/23/2017 Elsevier Interactive Patient Education  2019 Elsevier Inc.   

## 2019-11-25 NOTE — Discharge Summary (Signed)
[facility] Discharge Summary   Patient ID: Joel Ibarra. MRN: [id number] DOB/AGE: August 27, 1934 85 y.o.  Admit date: 11/17/2019 Discharge date: 11/26/2019  Admitting Diagnosis: Fall Left eighth ninth rib fractures with hemothorax Alzheimer's Acute kidney injury Acute blood loss anemia  Discharge Diagnosis Same  Consultants None  Imaging: DG CHEST PORT 1 VIEW  Result Date: 11/25/2019 CLINICAL DATA:  Pleural effusion.  Possible COVID. EXAM: PORTABLE CHEST 1 VIEW COMPARISON:  11/24/2019 FINDINGS: Mediastinum and hilar structures normal. Stable cardiomegaly. No pulmonary venous congestion. Bilateral upper lobe and left base atelectasis/infiltrates again noted. Small left pleural effusion again noted. No pneumothorax. Left rib fractures again noted. Degenerative changes and scoliosis thoracic spine IMPRESSION: 1. Bilateral upper lobe and left base atelectasis/infiltrates again noted. Small left pleural effusion again noted without interim change. No pneumothorax. 2. Stable cardiomegaly. 3.  Left rib fractures again noted. Electronically Signed   By: Marcello Moores  Register   On: 11/25/2019 05:43   DG Chest Port 1 View  Result Date: 11/24/2019 CLINICAL DATA:  Recurrent pneumothorax following chest tube removal EXAM: PORTABLE CHEST 1 VIEW COMPARISON:  Radiograph 11/23/2019 FINDINGS: Interval removal of a left pigtail pleural drain. No recurrent or residual pneumothorax is evident. A persistent small to moderate left pleural effusion is unchanged in size from comparison. Underlying atelectatic changes are present and similar to comparison as well. No new consolidative opacities are seen. Stable cardiomediastinal contours with a calcified aorta. Degenerative changes are present in the imaged spine and shoulders. Cholecystectomy clips in the right upper quadrant. No acute osseous or soft tissue abnormality. IMPRESSION: 1. No recurrent or residual pneumothorax following removal of  left pigtail pleural drain. 2. Stable small to moderate left pleural effusion 3. Atelectatic changes and opacity in the lungs are similar to comparison. Electronically Signed   By: Lovena Le M.D.   On: 11/24/2019 03:00    Procedures Dr. Redmond Pulling (11/17/2019) - Chest tube insertion  HPI: Joel Wolk. is an 85yo male PMH HTN and Alzheimer's who presented to [facility] 9/19 for evaluation of large left hemothorax and rib fractures. Patient has Alzheimer's and lives in a memory care unit.  He reportedly fell on Friday.  He was evaluated at the memory care unit and there was no apparent injuries.  On Saturday afternoon the staff noticed that he had some blue nailbeds, was complaining of pain, clammy, diaphoretic and he was taken to [facility].  He was evaluated.  CT of the chest revealed 2 rib fractures 1 of which was in multiple locations as well as a large left pleural effusion consistent with a hemothorax.  He did require 4 L of oxygen.  The daughter requested transfer to [facility] for management.  He was found also to have a mild acute kidney injury with a creatinine of 1.68.  His Covid test was negative.  Comprehensive metabolic panel was normal with the exception of the creatinine.  CBC showed a hemoglobin of 11, hematocrit 33, white count 10.6, platelet count 192.  Hospital Course:  Chest tube placed in the ED. Patient was admitted to the trauma service. Creatinine monitored and normalized after gentle hydration with IVF. Hemoglobin monitored and improved without the need for blood transfusion. Hemothorax monitored with serial chest xrays. Chest tube remained in place longer due to high output. Patient self removed chest tube 9/26. Follow up chest xray stable without pneumothorax, stable small pleural effusion. Patient was weaned from supplemental oxygen, but did still require intermittent supplemental  oxygen via nasal canula at night.   Patient worked with therapies  during this admission who recommended SNF when medically stable for discharge. On 9/28, the patient was ambulating well, pain well controlled, vital signs stable and felt stable for discharge to SNF.  Patient will follow up as below and knows to call with questions or concerns.    I have personally reviewed the patients medication history on the [place] controlled substance database.     Allergies as of 11/26/2019      Reactions   Bee Venom Swelling   Codeine Nausea And Vomiting, Nausea Only, Swelling, Other (See Comments)   Daptomycin Other (See Comments)   Pneumonitis   Clindamycin/lincomycin Rash   Lactose Diarrhea, Other (See Comments)   Cramps       Medication List    TAKE these medications   acetaminophen 325 MG tablet Commonly known as: TYLENOL Take 2 tablets (650 mg total) by mouth every 6 (six) hours as needed for mild pain.   aspirin EC 81 MG tablet Take 81 mg by mouth daily. Swallow whole.   docusate sodium 100 MG capsule Commonly known as: COLACE Take 1 capsule (100 mg total) by mouth 2 (two) times daily.   donepezil 5 MG tablet Commonly known as: ARICEPT Take 5 mg by mouth daily.   lidocaine 5 % Commonly known as: [place] one patch onto skin daily. Remove & Discard patch within 12 hours or as directed by MD   melatonin 5 MG Tabs Take 5 mg by mouth at bedtime as needed (insomnia).   memantine 5 MG tablet Commonly known as: NAMENDA Take 5 mg by mouth daily.   methocarbamol 750 MG tablet Commonly known as: ROBAXIN Take 1 tablet (750 mg total) by mouth every 8 (eight) hours as needed for muscle spasms.   metoprolol succinate 100 MG 24 hr tablet Commonly known as: TOPROL-XL Take 100 mg by mouth daily.   polyethylene glycol 17 g packet Commonly known as: MIRALAX / GLYCOLAX Take 17 g by mouth daily.   sertraline 100 MG tablet Commonly known as: ZOLOFT Take 100 mg by mouth daily.   traMADol 50 MG tablet Commonly known as: ULTRAM Take 0.5-1 tablets  (25-50 mg total) by mouth every 6 (six) hours as needed for moderate pain or severe pain.         Follow-up Information    Javier Glazier, MD. Call.   Specialty: Internal Medicine Why: call to arrange post-hospitalization follow up appointment with your primary care physician Contact information: [address] [place] [address] [phone]        [facility] Go on 12/04/2019.   Why: Please have a chest xray prior to your appointment in trauma clinic. Arrive between 8am and 5pm on 10/6 Contact information: [place] [address] [phone]        [facility]. Go on 12/05/2019.   Why: Your appointment is 10/7 at 10:40am Please arrive 30 minutes prior to your appointment to check in and fill out paperwork. Bring photo ID and insurance information. Contact information: [place] [address] [phone]              Signed: Wellington Hampshire, [facility] 11/25/2019, 4:13 PM Please see Amion for pager number during day hours 7:00am-4:30pm

## 2019-11-25 NOTE — TOC Progression Note (Signed)
Transition of Care (TOC) - Progression Note    Patient Details  Name: Joel Ibarra. MRN: [id number] Date of Birth: 1935/01/30  Transition of Care [facility]) CM/SW Contact  Ella Bodo, RN Phone Number: 11/25/2019, 4:06 PM  Clinical Narrative: Damaris Schooner with [facility] in admissions at [facility]; she states that patient will have a SNF bed available upon discharge. Notified her that patient may be ready for discharge as soon as tomorrow, 11/26/2019, as per discussion and trauma rounds. She states that patient will need a Covid test prior to admission to SNF. Notified provider to order Covid lab test.    Expected Discharge Plan: Skilled Nursing Facility Barriers to Discharge: Continued Medical Work up  Expected Discharge Plan and Services Expected Discharge Plan: [place] Choice: Nursing Home Living arrangements for the past 2 months: Assisted Living Facility                                       Social Determinants of Health (SDOH) Interventions    Readmission Risk Interventions No flowsheet data found.  Reinaldo Raddle, RN, BSN  Trauma/Neuro ICU Case Manager [phone]

## 2019-11-25 NOTE — Progress Notes (Signed)
[facility] Progress Note     Subjective: Patient pleasantly demented, no family at bedside this AM. O2 saturations in low 90s on room air. Patient denies pain or SOB.   Objective: Vital signs in last 24 hours: Temp:  [97.9 F (36.6 C)-98.4 F (36.9 C)] 98.4 F (36.9 C) (09/27 0735) Pulse Rate:  [59-78] 65 (09/27 1001) Resp:  [16-18] 18 (09/27 0735) BP: (123-134)/(60-93) 124/60 (09/27 0735) SpO2:  [90 %-100 %] 90 % (09/27 1001) Last BM Date: 11/23/19  Intake/Output from previous day: 09/26 0701 - 09/27 0700 In: 360 [P.O.:360] Out: 800 [Urine:800] Intake/Output this shift: No intake/output data recorded.  PE: General: pleasant, WD, WN male who is laying in bed in NAD HEENT: head is normocephalic, atraumatic.  Sclera are noninjected.  PERRL.  Ears and nose without any masses or lesions.  Mouth is pink and moist Heart: regular, rate, and rhythm.  Normal s1,s2. No obvious murmurs, gallops, or rubs noted.  Palpable radial and pedal pulses bilaterally Lungs: rales in left lung base.  Respiratory effort nonlabored. Pulled 1000 on IS. O2 sat 90% on room air  Abd: soft, NT, ND, +BS, no masses, hernias, or organomegaly MS: all 4 extremities are symmetrical with no cyanosis, clubbing, or edema. Skin: warm and dry with no masses, lesions, or rashes Neuro: Cranial nerves 2-12 grossly intact, sensation grossly intact Psych: alert but disoriented, pleasant affect   Lab Results:  Recent Labs    11/22/19 1106  WBC 8.7  HGB 9.4*  HCT 29.1*  PLT 217   BMET Recent Labs    11/22/19 1106 11/23/19 0337  NA 137 135  K 4.6 3.8  CL 99 102  CO2 28 25  GLUCOSE 101* 103*  BUN 16 17  CREATININE 1.25* 1.10  CALCIUM 8.6* 8.4*   PT/INR No results for input(s): LABPROT, INR in the last 72 hours. CMP     Component Value Date/Time   NA 135 11/23/2019 0337   K 3.8 11/23/2019 0337   CL 102 11/23/2019 0337   CO2 25 11/23/2019 0337   GLUCOSE 103 (H) 11/23/2019 0337   BUN  17 11/23/2019 0337   CREATININE 1.10 11/23/2019 0337   CALCIUM 8.4 (L) 11/23/2019 0337   PROT 6.6 02/01/2018 0151   ALBUMIN 3.0 (L) 02/01/2018 0151   AST 20 02/01/2018 0151   ALT 15 02/01/2018 0151   ALKPHOS 61 02/01/2018 0151   BILITOT 0.7 02/01/2018 0151   GFRNONAA >60 11/23/2019 0337   GFRAA >60 11/23/2019 0337   Lipase  No results found for: LIPASE     Studies/Results: DG CHEST PORT 1 VIEW  Result Date: 11/25/2019 CLINICAL DATA:  Pleural effusion.  Possible COVID. EXAM: PORTABLE CHEST 1 VIEW COMPARISON:  11/24/2019 FINDINGS: Mediastinum and hilar structures normal. Stable cardiomegaly. No pulmonary venous congestion. Bilateral upper lobe and left base atelectasis/infiltrates again noted. Small left pleural effusion again noted. No pneumothorax. Left rib fractures again noted. Degenerative changes and scoliosis thoracic spine IMPRESSION: 1. Bilateral upper lobe and left base atelectasis/infiltrates again noted. Small left pleural effusion again noted without interim change. No pneumothorax. 2. Stable cardiomegaly. 3.  Left rib fractures again noted. Electronically Signed   By: Marcello Moores  Register   On: 11/25/2019 05:43   DG Chest Port 1 View  Result Date: 11/24/2019 CLINICAL DATA:  Recurrent pneumothorax following chest tube removal EXAM: PORTABLE CHEST 1 VIEW COMPARISON:  Radiograph 11/23/2019 FINDINGS: Interval removal of a left pigtail pleural drain. No recurrent or residual pneumothorax is evident. A  persistent small to moderate left pleural effusion is unchanged in size from comparison. Underlying atelectatic changes are present and similar to comparison as well. No new consolidative opacities are seen. Stable cardiomediastinal contours with a calcified aorta. Degenerative changes are present in the imaged spine and shoulders. Cholecystectomy clips in the right upper quadrant. No acute osseous or soft tissue abnormality. IMPRESSION: 1. No recurrent or residual pneumothorax following  removal of left pigtail pleural drain. 2. Stable small to moderate left pleural effusion 3. Atelectatic changes and opacity in the lungs are similar to comparison. Electronically Signed   By: Lovena Le M.D.   On: 11/24/2019 03:00    Anti-infectives: Anti-infectives (From admission, onward)   None       Assessment/Plan Fall L8-9rib fxswith HTX- CXR this AM with no PTX, stable effusion. Give IV lasix 40 mg x1 and repeat CXR in AM, if no improvement, may need IR thoracentesis. Scheduled duonebs. Increased scheduled robaxin to decrease narcotic need. Encourage deep respirations. Needs to be sitting up and walking. AKI -resolved ABL anemia-hgb 9.4 (9/24), stable Alzheimer's- home meds, delirium precautions HTN - home metoprolol Code status DNR  ID -none FEN - reg diet VTE -SCDs,lovenox Foley -none Follow up-trauma clinic  Plan- delirium precautions, out of bed, continue to work with PT. TOC team following for SNF placement. I spoke with patient's daughter and updated her.   LOS: 8 days    Norm Parcel , [facility] 11/25/2019, 10:30 AM Please see Amion for pager number during day hours 7:00am-4:30pm

## 2019-11-26 ENCOUNTER — Inpatient Hospital Stay (HOSPITAL_COMMUNITY): Payer: Medicare Other

## 2019-11-26 LAB — SARS CORONAVIRUS 2 (TAT 6-24 HRS): SARS Coronavirus 2: NEGATIVE

## 2019-11-26 MED ORDER — ACETAMINOPHEN 325 MG PO TABS: 650.0000 mg | ORAL_TABLET | Freq: Four times a day (QID) | ORAL | Status: AC | PRN

## 2019-11-26 MED ORDER — IPRATROPIUM-ALBUTEROL 0.5-2.5 (3) MG/3ML IN SOLN: 3.0000 mL | RESPIRATORY_TRACT | Status: DC | PRN

## 2019-11-26 MED ORDER — METHOCARBAMOL 750 MG PO TABS: 750.0000 mg | ORAL_TABLET | Freq: Three times a day (TID) | ORAL | Status: AC | PRN

## 2019-11-26 MED ORDER — LIDOCAINE 5 % EX PTCH: MEDICATED_PATCH | CUTANEOUS | 0 refills | Status: AC

## 2019-11-26 MED ORDER — DOCUSATE SODIUM 100 MG PO CAPS: 100.0000 mg | ORAL_CAPSULE | Freq: Two times a day (BID) | ORAL | 0 refills | Status: AC

## 2019-11-26 MED ORDER — POLYETHYLENE GLYCOL 3350 17 G PO PACK: 17.0000 g | PACK | Freq: Every day | ORAL | 0 refills | Status: AC

## 2019-11-26 MED ORDER — TRAMADOL HCL 50 MG PO TABS: 25.0000 mg | ORAL_TABLET | Freq: Four times a day (QID) | ORAL | 0 refills | Status: AC | PRN

## 2019-11-26 NOTE — Care Management Important Message (Signed)
Important Message  Patient Details  Name: Joel Ibarra. MRN: [id number] Date of Birth: Oct 24, 1934   Medicare Important Message Given:  Yes - Important Message mailed due to current National Emergency  Verbal consent obtained due to current National Emergency  Relationship to patient: Child Contact Name: Einar Pheasant Call Date: 11/26/19  Time: 54 Phone: [phone] Outcome: No Answer/Busy Important Message mailed to: Patient address on file    Delorse Lek 11/26/2019, 11:30 AM

## 2019-11-26 NOTE — Progress Notes (Signed)
Physical Therapy Treatment Patient Details Name: Joel Ibarra. MRN: [id number] DOB: 04/03/1934 Today's Date: 11/26/2019    History of Present Illness Talton Delpriore. is an 85 y.o. male who is here for evaluation and management of a large left hemothorax and rib fractures after sustaining a fall.  Patient has Alzheimer's and lives in a memory care unit.  He reportedly fell on Friday.  He was evaluated at the memory care unit and there was no apparent injuries.  On Saturday afternoon the staff noticed that he had some blue nailbeds, was complaining of pain, clammy, diaphoretic and he was taken to [facility].  He was evaluated.  CT of the chest revealed 2 rib fractures 1 of which was in multiple locations as well as a large left pleural effusion consistent with a hemothorax.  He did require 4 L of oxygen and placement of chest tube.  The daughter requested transfer to [facility] for management.  He was found also to have a mild acute kidney injury     PT Comments    Pt supine on arrival, drowsy but agreeable to therapy session with good participation and tolerance for session. Pt presents with global deconditioning, increased time needed to initiate and perform mobility tasks but no acute s/sx pain and no increased work of breathing, pt on 3L O2 [place] during mobility. Pt modA for bed mobility with flat HOB and minA for mobility tasks, with assist needed to manage RW. Pt able to stand ~3 minutes at a time prior to needing seated rest break. Once seated in chair, pt able to wean to room air per MD request and SpO2 100% on RA, RN informed. Pt continues to benefit from PT services to progress toward functional mobility goals, pt making good progress towards goals. Continue to recommend SNF level of rehab.   Follow Up Recommendations  SNF;Supervision/Assistance - 24 hour     Equipment Recommendations  None recommended by PT (defer to next facility)     Recommendations for Other Services       Precautions / Restrictions Precautions Precautions: Fall Restrictions Weight Bearing Restrictions: No    Mobility  Bed Mobility Overal bed mobility: Needs Assistance Bed Mobility: Supine to Sit     Supine to sit: Mod assist (HOB flat)     General bed mobility comments: pt remained up in chair at end of session with alarm on  Transfers Overall transfer level: Needs assistance Equipment used: Rolling walker (2 wheeled) Transfers: Sit to/from [facility] Sit to Stand: Min assist Stand pivot transfers: Min assist       General transfer comment: from EOB, pt needs tactile cues for UE placement and poor carryover of cues for hand placement; x2 reps from EOB to RW, needs assist to manage RW during pivot transfer  Ambulation/Gait Ambulation/Gait assistance: Min assist Gait Distance (Feet): 15 Feet (14ft x3 with seated rest breaks between tasks) Assistive device: Rolling walker (2 wheeled) Gait Pattern/deviations: Step-to pattern;Trunk flexed Gait velocity: decreased Gait velocity interpretation: <1.8 ft/sec, indicate of risk for recurrent falls General Gait Details: SpO2 100% on 3L  during mobility (MD weaned later while pt resting in chair), pt needing minA to manage RW and minimal to moderate UE reliance on RW for support during mobility; no dyspnea observed   Stairs             Wheelchair Mobility    Modified Rankin (Stroke Patients Only)  Balance Overall balance assessment: Needs assistance;History of Falls Sitting-balance support: Single extremity supported;Feet supported Sitting balance-Leahy Scale: Fair Sitting balance - Comments: pt performed static sitting with Supervision, no LOB; min guard A during dynamic seated tasks 2/2 cognition and history of rib fxs/for safety   Standing balance support: Bilateral upper extremity supported;During functional activity Standing balance-Leahy Scale:  Poor Standing balance comment: needs assist to manage RW, no overt LOB but increased posterior lean during backward stepping needing minA for balance/AD use                            Cognition Arousal/Alertness: Awake/alert Behavior During Therapy: WFL for tasks assessed/performed Overall Cognitive Status: History of cognitive impairments - at baseline Area of Impairment: Orientation;Memory;Safety/judgement;Following commands                 Orientation Level: Disoriented to;Place;Time;Situation   Memory: Decreased short-term memory;Decreased recall of precautions Following Commands: Follows one step commands with increased time Safety/Judgement: Decreased awareness of safety;Decreased awareness of deficits Awareness: Emergent Problem Solving: Slow processing;Decreased initiation;Difficulty sequencing;Requires verbal cues;Requires tactile cues General Comments: needs increased time and cues for eyes open intermittently throughout session; slow to initiate tasks but participatory as able      Exercises      General Comments General comments (skin integrity, edema, etc.): on 3L O2 [place] during mobility but once resting, MD entered room and pt weaned to RA, SpO2 100% on RA per pulse ox monitor for >2 minutes; able to use Incentive Spirometry up to ~1000, pt encouraged to perform hourly 10x, will need reinforcement 2/2 cognition; chair alarm on at end of session, RN aware; use of call bell reinforced via demo/teachback      Pertinent Vitals/Pain Pain Assessment: No/denies pain Faces Pain Scale: No hurt Pain Intervention(s): Monitored during session    Home Living                      Prior Function            PT Goals (current goals can now be found in the care plan section) Acute Rehab PT Goals Patient Stated Goal: get better PT Goal Formulation: With patient/family Time For Goal Achievement: 12/02/19 Potential to Achieve Goals: Good Progress towards  PT goals: Progressing toward goals    Frequency    Min 3X/week      PT Plan Current plan remains appropriate    Co-evaluation              AM-PAC PT "6 Clicks" Mobility   Outcome Measure  Help needed turning from your back to your side while in a flat bed without using bedrails?: A Little Help needed moving from lying on your back to sitting on the side of a flat bed without using bedrails?: A Lot Help needed moving to and from a bed to a chair (including a wheelchair)?: A Lot Help needed standing up from a chair using your arms (e.g., wheelchair or bedside chair)?: A Little Help needed to walk in hospital room?: A Little Help needed climbing 3-5 steps with a railing? : Total 6 Click Score: 14    End of Session Equipment Utilized During Treatment: Oxygen;Gait belt Activity Tolerance: Patient tolerated treatment well;Other (comment) (drowsy t/o but opens eyes to command and during gait) Patient left: in chair;with chair alarm set;with call bell/phone within reach;Other (comment) (fall mat and tray table in front of pt chair, legs reclined) Nurse  Communication: Mobility status (pt recently weaned from O2, RN to monitor in a few minutes) PT Visit Diagnosis: Unsteadiness on feet (R26.81);Muscle weakness (generalized) (M62.81);Difficulty in walking, not elsewhere classified (R26.2);Pain     Time: 7127-8718 PT Time Calculation (min) (ACUTE ONLY): 26 min  Charges:  $Gait Training: 8-22 mins $Therapeutic Activity: 8-22 mins                     Zionna Homewood P., PTA Acute Rehabilitation Services Pager: [phone] Office: [place] 11/26/2019, 12:09 PM

## 2019-11-26 NOTE — Progress Notes (Signed)
Occupational Therapy Treatment Patient Details Name: Joel Ibarra. MRN: [id number] DOB: 1934/05/30 Today's Date: 11/26/2019    History of present illness Joel Ibarra. is an 85 y.o. male who is here for evaluation and management of a large left hemothorax and rib fractures after sustaining a fall.  Patient has Alzheimer's and lives in a memory care unit.  He reportedly fell on Friday.  He was evaluated at the memory care unit and there was no apparent injuries.  On Saturday afternoon the staff noticed that he had some blue nailbeds, was complaining of pain, clammy, diaphoretic and he was taken to [facility].  He was evaluated.  CT of the chest revealed 2 rib fractures 1 of which was in multiple locations as well as a large left pleural effusion consistent with a hemothorax.  He did require 4 L of oxygen and placement of chest tube.  The daughter requested transfer to [facility] for management.  He was found also to have a mild acute kidney injury    OT comments  Pt reporting need for toileting task assistance. Guided pt in mobility to/from bathroom with RW at [place] for manuevering RW and maintaining balance. Pt overall Min A for toileting task, requiring consistent cues for sequencing and assistance for hygiene thoroughness. Pt Min A for thorough hand hygiene while standing at sink. SpO2 WFL on RA.    Follow Up Recommendations  SNF;Supervision/Assistance - 24 hour    Equipment Recommendations  None recommended by OT    Recommendations for Other Services      Precautions / Restrictions Precautions Precautions: Fall Restrictions Weight Bearing Restrictions: No       Mobility Bed Mobility Overal bed mobility: Needs Assistance Bed Mobility: Supine to Sit     Supine to sit: Mod assist (HOB flat)     General bed mobility comments: up in chair  Transfers Overall transfer level: Needs assistance Equipment used: Rolling walker (2  wheeled) Transfers: Sit to/from [facility] Sit to Stand: Min assist Stand pivot transfers: Min assist       General transfer comment: Min A for power up and to lean forward, Min A for turning RW at toilet`    Balance Overall balance assessment: Needs assistance;History of Falls Sitting-balance support: Single extremity supported;Feet supported Sitting balance-Leahy Scale: Fair Sitting balance - Comments: pt performed static sitting with Supervision, no LOB; min guard A during dynamic seated tasks 2/2 cognition and history of rib fxs/for safety   Standing balance support: Bilateral upper extremity supported;During functional activity Standing balance-Leahy Scale: Poor Standing balance comment: needs assist to manage RW, no overt LOB but increased posterior lean during backward stepping needing minA for balance/AD use                           ADL either performed or assessed with clinical judgement   ADL Overall ADL's : Needs assistance/impaired     Grooming: Minimal assistance;Standing;Oral care;Brushing hair Grooming Details (indicate cue type and reason): Min A for brushing teeth standing at sink, assistance needed throughout for sequening and coordinating bimanual tasks. Pt able to brush hair with min guard for safety in standing                 Toilet Transfer: Minimal assistance;Ambulation;Cueing for sequencing;Cueing for safety;Regular Toilet;RW Toilet Transfer Details (indicate cue type and reason): Min A for maintaining balance and manuvering RW, increased cues for sequencing  turning to toilet Toileting- Clothing Manipulation and Hygiene: Minimal assistance;Sit to/from stand Toileting - Clothing Manipulation Details (indicate cue type and reason): Min A for thoroughness, noted to get fecal matter on hands - cues needed for tossing toilet paper in toilet, etc     Functional mobility during ADLs: Minimal assistance;Cueing for safety;Cueing  for sequencing;Rolling walker General ADL Comments: Pt with increasing unsteadiness as he fatigues, cues for sequencing throughout     Vision   Vision Assessment?: No apparent visual deficits   Perception     Praxis      Cognition Arousal/Alertness: Awake/alert Behavior During Therapy: WFL for tasks assessed/performed Overall Cognitive Status: History of cognitive impairments - at baseline Area of Impairment: Orientation;Memory;Safety/judgement;Following commands                 Orientation Level: Disoriented to;Place;Time;Situation   Memory: Decreased short-term memory;Decreased recall of precautions Following Commands: Follows one step commands with increased time Safety/Judgement: Decreased awareness of safety;Decreased awareness of deficits Awareness: Emergent Problem Solving: Slow processing;Decreased initiation;Difficulty sequencing;Requires verbal cues;Requires tactile cues General Comments: cues for sequencing daily tasks needed, but able to indicate when need to use bathroom        Exercises     Shoulder Instructions       General Comments Pt on RA throughout session, VSS on RA    Pertinent Vitals/ Pain       Pain Assessment: No/denies pain Faces Pain Scale: No hurt Pain Intervention(s): Monitored during session  Home Living                                          Prior Functioning/Environment              Frequency  Min 2X/week        Progress Toward Goals  OT Goals(current goals can now be found in the care plan section)  Progress towards OT goals: Progressing toward goals  Acute Rehab OT Goals Patient Stated Goal: get better OT Goal Formulation: With patient Time For Goal Achievement: 12/02/19 Potential to Achieve Goals: Good ADL Goals Pt Will Perform Grooming: with modified independence;standing Pt Will Perform Lower Body Bathing: with supervision;sit to/from stand;sitting/lateral leans Pt Will Perform Lower  Body Dressing: with set-up;sit to/from stand;sitting/lateral leans Pt Will Transfer to Toilet: with modified independence;ambulating;bedside commode Pt Will Perform Toileting - Clothing Manipulation and hygiene: with modified independence;sitting/lateral leans;sit to/from stand  Plan Discharge plan remains appropriate    Co-evaluation                 AM-PAC OT "6 Clicks" Daily Activity     Outcome Measure   Help from another person eating meals?: A Little Help from another person taking care of personal grooming?: A Little Help from another person toileting, which includes using toliet, bedpan, or urinal?: A Little Help from another person bathing (including washing, rinsing, drying)?: A Little Help from another person to put on and taking off regular upper body clothing?: A Little Help from another person to put on and taking off regular lower body clothing?: A Little 6 Click Score: 18    End of Session Equipment Utilized During Treatment: Gait belt;Rolling walker  OT Visit Diagnosis: Unsteadiness on feet (R26.81);Other abnormalities of gait and mobility (R26.89);Muscle weakness (generalized) (M62.81);History of falling (Z91.81);Other symptoms and signs involving cognitive function;Pain Pain - Right/Left: Left   Activity Tolerance Patient tolerated treatment  well   Patient Left in chair;with call bell/phone within reach;with chair alarm set;with family/visitor present   Nurse Communication Mobility status;Other (comment) (SpO2)        Time: 0165-8006 OT Time Calculation (min): 18 min  Charges: OT General Charges $OT Visit: 1 Visit OT Treatments $Self Care/Home Management : 8-22 mins  Layla Maw, OTR/L   Layla Maw 11/26/2019, 2:23 PM

## 2019-11-26 NOTE — TOC Transition Note (Signed)
Transition of Care [facility]) - CM/SW Discharge Note   Patient Details  Name: Joel Ibarra. MRN: [id number] Date of Birth: 1934/11/22  Transition of Care [facility]) CM/SW Contact:  Ella Bodo, RN Phone Number: 11/26/2019, 2:50 PM   Clinical Narrative:   Pt medically stable for discharge to [facility] SNF today; bed confirmed with Whitney in admissions at facility.  Pt going to room #7017; bedside nurse to call report to [phone].  Discharge summary sent to facility.    PTAR notified for transport at 2:32pm.  Daughter [name] in room; aware of dc, and in agreement.     Final next level of care: Skilled Nursing Facility Barriers to Discharge: Barriers Resolved   Patient Goals and CMS Choice   CMS Medicare.gov Compare Post Acute Care list provided to:: Patient Represenative (must comment) (daughter , [name]) Choice offered to / list presented to : Adult Children  Discharge Placement   Existing PASRR number confirmed : 11/21/19          Patient chooses bed at: Other - please specify in the comment section below: [facility] SNF) Patient to be transferred to facility by: PTAR; called at 2:32pm Name of family member notified: daughter [name] Patient and family notified of of transfer: 11/26/19  Discharge Plan and Services   Discharge Planning Services: CM Consult Post Acute Care Choice: [place]                               Social Determinants of Health (SDOH) Interventions     Readmission Risk Interventions No flowsheet data found.  Reinaldo Raddle, RN, BSN  Trauma/Neuro ICU Case Manager [phone]

## 2019-11-26 NOTE — Progress Notes (Signed)
PTAR arrived to transport patient to [facility].  Patient is alert and oriented x 1.  Vitals within normal limits.  Patient did not need any pain medication for transport.

## 2019-11-26 NOTE — Progress Notes (Signed)
Occupational Therapy Treatment Patient Details Name: Joel Ibarra. MRN: [id number] DOB: 1934-11-23 Today's Date: 11/26/2019    History of present illness Joel Lile. is an 85 y.o. male who is here for evaluation and management of a large left hemothorax and rib fractures after sustaining a fall.  Patient has Alzheimer's and lives in a memory care unit.  He reportedly fell on Friday.  He was evaluated at the memory care unit and there was no apparent injuries.  On Saturday afternoon the staff noticed that he had some blue nailbeds, was complaining of pain, clammy, diaphoretic and he was taken to [facility].  He was evaluated.  CT of the chest revealed 2 rib fractures 1 of which was in multiple locations as well as a large left pleural effusion consistent with a hemothorax.  He did require 4 L of oxygen and placement of chest tube.  The daughter requested transfer to [facility] for management.  He was found also to have a mild acute kidney injury    OT comments  Pt progressing well with OT goals, pleasant and willing to participate. Pt overall Min A for transfers and short mobility to/from bathroom with RW, continued assistance needed to maneuver RW safely and maintain balance. Pt demonstrated ability to stand at sink for grooming tasks > 5 minutes, Min A required for sequencing multi-step ADL tasks and maintaining balance. SpO2 88% on RA during activity, recovering quickly to 92% after seated rest break and pursed lip breathing.    Follow Up Recommendations  SNF;Supervision/Assistance - 24 hour    Equipment Recommendations  None recommended by OT    Recommendations for Other Services      Precautions / Restrictions Precautions Precautions: Fall Restrictions Weight Bearing Restrictions: No       Mobility Bed Mobility               General bed mobility comments: up in recliner on entry  Transfers Overall transfer level: Needs  assistance Equipment used: Rolling walker (2 wheeled) Transfers: Sit to/from [facility] Sit to Stand: Min assist Stand pivot transfers: Min assist       General transfer comment: Min A for power up from recliner chair and to shift weight forward, Min A for turning and manuevering RW    Balance Overall balance assessment: Needs assistance;History of Falls Sitting-balance support: No upper extremity supported;Feet supported Sitting balance-Leahy Scale: Fair     Standing balance support: Bilateral upper extremity supported;During functional activity Standing balance-Leahy Scale: Poor Standing balance comment: able to complete some tasks with one handed support                           ADL either performed or assessed with clinical judgement   ADL Overall ADL's : Needs assistance/impaired     Grooming: Minimal assistance;Standing;Oral care;Brushing hair Grooming Details (indicate cue type and reason): Min A for brushing teeth standing at sink, assistance needed throughout for sequening and coordinating bimanual tasks. Pt able to brush hair with min guard for safety in standing                             Functional mobility during ADLs: Minimal assistance;Cueing for safety;Cueing for sequencing;Rolling walker General ADL Comments: Pt with improving cardiopulmonary tolerance, strength but continues to require assistance for balance and sequencing     Vision   Vision  Assessment?: No apparent visual deficits   Perception     Praxis      Cognition Arousal/Alertness: Awake/alert Behavior During Therapy: WFL for tasks assessed/performed Overall Cognitive Status: History of cognitive impairments - at baseline Area of Impairment: Orientation;Memory;Safety/judgement;Following commands                     Memory: Decreased short-term memory;Decreased recall of precautions Following Commands: Follows one step commands with increased  time Safety/Judgement: Decreased awareness of safety;Decreased awareness of deficits Awareness: Emergent Problem Solving: Slow processing;Decreased initiation;Difficulty sequencing;Requires verbal cues;Requires tactile cues General Comments: able to follow one step commands with repetition at times. Difficulty sequencing multi-step ADL tasks, requiring cues throughout for completion        Exercises     Shoulder Instructions       General Comments Pt received on RA, desats to 88% on RA during activity (standing >5 minutes) but quickly recovers and sustains at 92% after pursed lip breathing cues    Pertinent Vitals/ Pain       Pain Assessment: No/denies pain  Home Living                                          Prior Functioning/Environment              Frequency  Min 2X/week        Progress Toward Goals  OT Goals(current goals can now be found in the care plan section)  Progress towards OT goals: Progressing toward goals  Acute Rehab OT Goals Patient Stated Goal: get better OT Goal Formulation: With patient Time For Goal Achievement: 12/02/19 Potential to Achieve Goals: Good ADL Goals Pt Will Perform Grooming: with modified independence;standing Pt Will Perform Lower Body Bathing: with supervision;sit to/from stand;sitting/lateral leans Pt Will Perform Lower Body Dressing: with set-up;sit to/from stand;sitting/lateral leans Pt Will Transfer to Toilet: with modified independence;ambulating;bedside commode Pt Will Perform Toileting - Clothing Manipulation and hygiene: with modified independence;sitting/lateral leans;sit to/from stand  Plan Discharge plan remains appropriate    Co-evaluation                 AM-PAC OT "6 Clicks" Daily Activity     Outcome Measure   Help from another person eating meals?: A Little Help from another person taking care of personal grooming?: A Little Help from another person toileting, which includes  using toliet, bedpan, or urinal?: A Little Help from another person bathing (including washing, rinsing, drying)?: A Little Help from another person to put on and taking off regular upper body clothing?: A Little Help from another person to put on and taking off regular lower body clothing?: A Little 6 Click Score: 18    End of Session Equipment Utilized During Treatment: Gait belt;Rolling walker  OT Visit Diagnosis: Unsteadiness on feet (R26.81);Other abnormalities of gait and mobility (R26.89);Muscle weakness (generalized) (M62.81);History of falling (Z91.81);Other symptoms and signs involving cognitive function;Pain   Activity Tolerance Patient tolerated treatment well   Patient Left in chair;with call bell/phone within reach;with chair alarm set   Nurse Communication Mobility status;Other (comment) (SpO2)        Time: 7062-3762 OT Time Calculation (min): 24 min  Charges: OT General Charges $OT Visit: 1 Visit OT Treatments $Self Care/Home Management : 23-37 mins  Layla Maw, OTR/L   Layla Maw 11/26/2019, 11:19 AM

## 2019-11-26 NOTE — Progress Notes (Signed)
Patient ID: Joel Ibarra., male   DOB: [birth year], 85 y.o.   MRN: [id number]     Subjective: Up in chair Just ate  ROS negative except as listed above. Objective: Vital signs in last 24 hours: Temp:  [97.5 F (36.4 C)-98.4 F (36.9 C)] 97.9 F (36.6 C) (09/28 0731) Pulse Rate:  [52-70] 52 (09/28 0731) Resp:  [16-18] 16 (09/28 0731) BP: (112-138)/(62-80) 113/63 (09/28 0731) SpO2:  [93 %-100 %] 100 % (09/28 0731) Last BM Date: 11/23/19  Intake/Output from previous day: 09/27 0701 - 09/28 0700 In: 485 [P.O.:485] Out: 1450 [Urine:1450] Intake/Output this shift: Total I/O In: 120 [P.O.:120] Out: -   General appearance: alert Resp: clear to auscultation bilaterally Cardio: regular rate and rhythm GI: soft, NT Extremities: calves soft Neurologic: Mental status: does F/C  Did 1000 on IS  Lab Results: CBC  No results for input(s): WBC, HGB, HCT, PLT in the last 72 hours. BMET No results for input(s): NA, K, CL, CO2, GLUCOSE, BUN, CREATININE, CALCIUM in the last 72 hours. PT/INR No results for input(s): LABPROT, INR in the last 72 hours. ABG No results for input(s): PHART, HCO3 in the last 72 hours.  Invalid input(s): PCO2, PO2  Studies/Results: DG CHEST PORT 1 VIEW  Result Date: 11/26/2019 CLINICAL DATA:  Pleural effusion. EXAM: PORTABLE CHEST 1 VIEW COMPARISON:  11/25/2019 FINDINGS: Shallow inspiration. Mild cardiac enlargement. Infiltration or atelectasis in the left lung base with probable small left pleural effusion. Multiple left rib fractures. Mild infiltration or atelectasis suggested in the upper lungs bilaterally. Degenerative changes in the spine and shoulders. Similar appearance to previous study. IMPRESSION: Shallow inspiration with infiltration or atelectasis in the left lung base and bilateral upper lungs. Probable small left pleural effusion. Electronically Signed   By: Lucienne Capers M.D.   On: 11/26/2019 06:26   DG CHEST PORT 1  VIEW  Result Date: 11/25/2019 CLINICAL DATA:  Pleural effusion.  Possible COVID. EXAM: PORTABLE CHEST 1 VIEW COMPARISON:  11/24/2019 FINDINGS: Mediastinum and hilar structures normal. Stable cardiomegaly. No pulmonary venous congestion. Bilateral upper lobe and left base atelectasis/infiltrates again noted. Small left pleural effusion again noted. No pneumothorax. Left rib fractures again noted. Degenerative changes and scoliosis thoracic spine IMPRESSION: 1. Bilateral upper lobe and left base atelectasis/infiltrates again noted. Small left pleural effusion again noted without interim change. No pneumothorax. 2. Stable cardiomegaly. 3.  Left rib fractures again noted. Electronically Signed   By: Marcello Moores  Register   On: 11/25/2019 05:43    Anti-infectives: Anti-infectives (From admission, onward)   None      Assessment/Plan: Fall L8-9rib fxswith HTX- CXR this AM with no PTX, stable effusion. Plan to wean O2 off AKI -resolved ABL anemia-hgb 9.4 (9/24), stable Alzheimer's- home meds, delirium precautions HTN - home metoprolol Code status DNR  ID -none FEN - reg diet VTE -SCDs,lovenox Foley -none Follow up-trauma clinic  Plan- delirium precautions, out of bed, continue to work with PT. Ready for SNF placement. Will discuss with TOC.    LOS: 9 days    Georganna Skeans, MD, MPH, FACS Trauma & General Surgery Use [email or web address] to contact on call provider  11/26/2019

## 2019-11-26 NOTE — Progress Notes (Signed)
Waiting for transportation to [facility]. Report was given to [place].

## 2019-11-27 ENCOUNTER — Ambulatory Visit: Payer: Medicare Other | Admitting: Surgical

## 2019-11-27 ENCOUNTER — Ambulatory Visit: Payer: Medicare Other | Admitting: Plastic Surgery

## 2020-02-19 ENCOUNTER — Telehealth: Payer: Self-pay | Admitting: Oncology

## 2020-02-19 NOTE — Telephone Encounter (Signed)
Received a new hem referral from Dr. 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 Joel Ibarra has been scheduled to see Dr. Alen Blew on 1/4 at 11am. Appt date and time has been given to the pt's daughter who is aware to arrive 30 minutes early

## 2020-03-02 ENCOUNTER — Telehealth: Payer: Self-pay | Admitting: Oncology

## 2020-03-02 NOTE — Telephone Encounter (Signed)
Mr. Pelzer daughter, Mrs. Warren Danes cld upset that the facility her father is in, rescheduled his appt with her knowledge. He was originally scheduled on 1/4 to see Dr. Clelia Croft. He has now been rescheduled to see Dr. Clelia Croft on 1/7 because the 1/4 appt was no longer available at 11am. Appt date and time has been given to Mrs. Carpenter. She has been made aware that we allow only one visitor in the appt w/her father due to COVID-19. She voiced understanding. I informed her that she can may put her sister on speaker during the visit as well.

## 2020-03-02 NOTE — Telephone Encounter (Signed)
I received a call from the pt's nursing home to reschedule his new pt appt w/Dr. Clelia Croft to 1/13 at 2pm. Mr. Joel Ibarra has been made aware for the pt to arrie 30 minutes early.

## 2020-03-03 ENCOUNTER — Ambulatory Visit: Payer: Medicare Other | Admitting: Oncology

## 2020-03-06 ENCOUNTER — Inpatient Hospital Stay: Payer: Medicare Other | Attending: Oncology | Admitting: Oncology

## 2020-03-06 ENCOUNTER — Other Ambulatory Visit: Payer: Self-pay

## 2020-03-06 VITALS — BP 141/78 | HR 56 | Temp 97.9°F | Resp 15 | Ht 70.0 in | Wt 171.4 lb

## 2020-03-06 DIAGNOSIS — I10 Essential (primary) hypertension: Secondary | ICD-10-CM

## 2020-03-06 DIAGNOSIS — C4440 Unspecified malignant neoplasm of skin of scalp and neck: Secondary | ICD-10-CM

## 2020-03-06 DIAGNOSIS — Z87891 Personal history of nicotine dependence: Secondary | ICD-10-CM | POA: Diagnosis not present

## 2020-03-06 DIAGNOSIS — F0390 Unspecified dementia without behavioral disturbance: Secondary | ICD-10-CM

## 2020-03-06 DIAGNOSIS — C4442 Squamous cell carcinoma of skin of scalp and neck: Secondary | ICD-10-CM | POA: Diagnosis not present

## 2020-03-06 DIAGNOSIS — C4490 Unspecified malignant neoplasm of skin, unspecified: Secondary | ICD-10-CM

## 2020-03-06 NOTE — Progress Notes (Signed)
Reason for the request:    Squamous cell carcinoma of the skin  HPI: I was asked by Dr. Mitkov  to evaluate Joel Ibarra for evaluation of squamous cell carcinoma of the scalp. He is an 85-year-old man with history of dementia, hypertension as well as other comorbid conditions. He was found to have left occipital scalp lesion. He was evaluated by Dr. Mitkov and the biopsy obtained on February 10, 2020. The biopsy showed metastatic poorly differentiated carcinoma. He has previously diagnosed with squamous cell carcinoma of the scalp and underwent Mohs procedure at that time.  He subsequently underwent reconstruction of scalp defect as well as excision of a scalp tumor completed on October 04, 2019.  The final pathology at that time did not show any residual tumor.  Clinically, he has noted to have an enlarging lesion at the left occipital area with smaller lesion more near to it.  He does report some mild drainage but no pain or discomfort.  He had does have dementia and provides very little additional history at this time.  According to his daughters, his performance status and quality of life has not changed dramatically.  The does not report any headaches, blurry vision, syncope or seizures. Does not report any fevers, chills or sweats.  Does not report any cough, wheezing or hemoptysis.  Does not report any chest pain, palpitation, orthopnea or leg edema.  Does not report any nausea, vomiting or abdominal pain.  Does not report any constipation or diarrhea.  Does not report any skeletal complaints.    Does not report frequency, urgency or hematuria.  Does not report any skin rashes or lesions. Does not report any heat or cold intolerance.  Does not report any lymphadenopathy or petechiae.  Does not report any anxiety or depression.  Remaining review of systems is negative.    Past Medical History:  Diagnosis Date  . Cancer (HCC)   . Dementia (HCC)   . History of kidney stones     numerous-  Lithrotrispy  . Hypertension   . Hypothyroidism   . Kidney stones   . Memory loss   :  Past Surgical History:  Procedure Laterality Date  . ADJACENT TISSUE TRANSFER/TISSUE REARRANGEMENT N/A 10/04/2019   Procedure: Excision of scalp squamous cell carcinoma and reconstruction with adjacent tissue transfer;  Surgeon: Pace, Collier S, MD;  Location: [facility];  Service: Plastics;  Laterality: N/A;  . AMPUTATION Left 01/31/2018   Procedure: LEFT FOOT SECOND TOE AMPUTATION;  Surgeon: Duda, Marcus V, MD;  Location: [facility] OR;  Service: Orthopedics;  Laterality: Left;  . AMPUTATION Left 08/29/2018   Procedure: LEFT 3RD TOE AMPUTATION;  Surgeon: Duda, Marcus V, MD;  Location: [facility] OR;  Service: Orthopedics;  Laterality: Left;  . CHOLECYSTECTOMY    . EYE SURGERY Bilateral    catract  . left first toe AMPUTATION    . NISSEN FUNDOPLICATION    :   Current Outpatient Medications:  .  acetaminophen (TYLENOL) 325 MG tablet, Take 2 tablets (650 mg total) by mouth every 6 (six) hours as needed for mild pain., Disp: , Rfl:  .  aspirin EC 81 MG tablet, Take 81 mg by mouth daily. Swallow whole., Disp: , Rfl:  .  docusate sodium (COLACE) 100 MG capsule, Take 1 capsule (100 mg total) by mouth 2 (two) times daily., Disp: 10 capsule, Rfl: 0 .  donepezil (ARICEPT) 5 MG tablet, Take 5 mg by mouth daily., Disp: , Rfl:  .  lidocaine (LIDODERM) 5 %,   Place one patch onto skin daily. Remove & Discard patch within 12 hours or as directed by MD, Disp: 30 patch, Rfl: 0 .  melatonin 5 MG TABS, Take 5 mg by mouth at bedtime as needed (insomnia)., Disp: , Rfl:  .  memantine (NAMENDA) 5 MG tablet, Take 5 mg by mouth daily., Disp: , Rfl:  .  methocarbamol (ROBAXIN) 750 MG tablet, Take 1 tablet (750 mg total) by mouth every 8 (eight) hours as needed for muscle spasms., Disp: , Rfl:  .  metoprolol succinate (TOPROL-XL) 100 MG 24 hr tablet, Take 100 mg by mouth daily., Disp: , Rfl:  .  polyethylene glycol (MIRALAX / GLYCOLAX) 17 g packet,  Take 17 g by mouth daily., Disp: 14 each, Rfl: 0 .  sertraline (ZOLOFT) 100 MG tablet, Take 100 mg by mouth daily., Disp: , Rfl:  .  traMADol (ULTRAM) 50 MG tablet, Take 0.5-1 tablets (25-50 mg total) by mouth every 6 (six) hours as needed for moderate pain or severe pain., Disp: 20 tablet, Rfl: 0:  Allergies  Allergen Reactions  . Bee Venom Swelling  . Codeine Nausea And Vomiting, Nausea Only, Swelling and Other (See Comments)  . Daptomycin Other (See Comments)    Pneumonitis  . Clindamycin/Lincomycin Rash  . Lactose Diarrhea and Other (See Comments)    Cramps    :  Family History  Problem Relation Age of Onset  . Memory loss Mother   . Memory loss Father   :  Social History   Socioeconomic History  . Marital status: Married    Spouse name: Not on file  . Number of children: Not on file  . Years of education: Not on file  . Highest education level: Not on file  Occupational History  . Not on file  Tobacco Use  . Smoking status: Former Smoker    Years: 5.00    Quit date: 1970    Years since quitting: 52.0  . Smokeless tobacco: Never Used  Vaping Use  . Vaping Use: Never used  Substance and Sexual Activity  . Alcohol use: Yes    Alcohol/week: 7.0 standard drinks    Types: 7 Standard drinks or equivalent per week  . Drug use: Never  . Sexual activity: Not on file  Other Topics Concern  . Not on file  Social History Narrative  . Not on file   Social Determinants of Health   Financial Resource Strain: Not on file  Food Insecurity: Not on file  Transportation Needs: Not on file  Physical Activity: Not on file  Stress: Not on file  Social Connections: Not on file  Intimate Partner Violence: Not on file  :  Pertinent items are noted in HPI.  Exam: Blood pressure (!) 141/78, pulse (!) 56, temperature 97.9 F (36.6 C), temperature source Tympanic, resp. rate 15, height 5' 10" (1.778 m), weight 171 lb 6.4 oz (77.7 kg), SpO2 98 %.   ECOG 1 General  appearance: alert and cooperative appeared without distress. Head: atraumatic without any abnormalities. Eyes: conjunctivae/corneas clear. PERRL.  Sclera anicteric. Throat: lips, mucosa, and tongue normal; without oral thrush or ulcers. Resp: clear to auscultation bilaterally without rhonchi, wheezes or dullness to percussion. Cardio: regular rate and rhythm, S1, S2 normal, no murmur, click, rub or gallop GI: soft, non-tender; bowel sounds normal; no masses,  no organomegaly Skin: Left-sided occipital scalp lesion noted.  Mild erythema and induration.  Another indentation noted cephalad from that lesion. Lymph nodes: Cervical, supraclavicular, and axillary nodes   normal. Neurologic: Grossly normal without any motor, sensory or deep tendon reflexes. Musculoskeletal: No joint deformity or effusion.    Assessment and Plan:    85-year-old man with:  1. 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 He had Mohs procedure done on his original tumor.   The natural course of this disease was reviewed at this time and treatment options were discussed. First step is to stage his disease at this time and rule out any evidence of distant metastasis. Obtaining a PET scan would be the first step and treatment options will be discussed subsequently. These options would include local therapy with further excision and radiation therapy versus systemic therapy with immune agent including PD-L1 inhibitors among others.  Risks and benefits of these treatments were discussed today.  Complications associated with this class of drug include immune mediated complications, dermatitis, pneumonitis among others.  Treatment would be palliative at best and that the goal of therapy always to control his disease and to prevent any recurrent symptoms.  After discussion today, I recommended proceeding with a PET scan and the patient and his family agrees.  We will also have an opinion from plastic  surgery regarding repeat surgical resection.   2.  Follow-up: Will be in the near future after completing pet imaging  60  minutes were dedicated to this visit. The time was spent on reviewing pathology results, discussing treatment options, discussing differential diagnosis and answering questions regarding future plan.    A copy of this consult has been forwarded to the requesting physician.  

## 2020-03-12 ENCOUNTER — Other Ambulatory Visit: Payer: Self-pay

## 2020-03-12 ENCOUNTER — Ambulatory Visit: Payer: Medicare Other | Admitting: Oncology

## 2020-03-12 ENCOUNTER — Ambulatory Visit (INDEPENDENT_AMBULATORY_CARE_PROVIDER_SITE_OTHER): Payer: Medicare Other | Admitting: Plastic Surgery

## 2020-03-12 ENCOUNTER — Encounter: Payer: Self-pay | Admitting: Plastic Surgery

## 2020-03-12 VITALS — BP 136/70 | HR 84 | Ht 72.0 in | Wt 178.0 lb

## 2020-03-12 DIAGNOSIS — C4442 Squamous cell carcinoma of skin of scalp and neck: Secondary | ICD-10-CM | POA: Diagnosis not present

## 2020-03-12 NOTE — Progress Notes (Signed)
Referring Provider Javier Glazier, MD [place],  [place] [address]   CC:  Chief Complaint  Patient presents with  . Advice Only      Joel Ibarra. is an 86 y.o. male.  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 He had previously had a Mohs excision of squamous cell carcinoma on the vertex or frontal area of his scalp which had a positive deep margin. I was able to clear the deep margin with removal of the pericranium and closed it with adjacent tissue transfer. He healed up nicely from that. Subsequently a new mass was identified and occipital area of his scalp towards the left side which was biopsied and revealed metastatic squamous cell carcinoma. He has seen Dr. Alen Blew with oncology and has a PET scan pending. He is here with his daughter to discuss treatment options. 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  Allergies  Allergen Reactions  . Bee Venom Swelling  . Codeine Nausea And Vomiting, Nausea Only, Swelling and Other (See Comments)  . Daptomycin Other (See Comments)    Pneumonitis  . Clindamycin/Lincomycin Rash  . Lactose Diarrhea and Other (See Comments)    Cramps      Outpatient Encounter Medications as of 03/12/2020  Medication Sig  . acetaminophen (TYLENOL) 325 MG tablet Take 2 tablets (650 mg total) by mouth every 6 (six) hours as needed for mild pain.  Marland Kitchen aspirin EC 81 MG tablet Take 81 mg by mouth daily. Swallow whole.  . docusate sodium (COLACE) 100 MG capsule Take 1 capsule (100 mg total) by mouth 2 (two) times daily.  Marland Kitchen donepezil (ARICEPT) 5 MG tablet Take 5 mg by mouth daily.  Marland Kitchen lidocaine (LIDODERM) 5 % Place one patch onto skin daily. Remove & Discard patch within 12 hours or as directed by MD   . melatonin 5 MG TABS Take 5 mg by mouth at bedtime as needed (insomnia).  . memantine (NAMENDA) 5 MG tablet Take 5 mg by mouth daily.  . methocarbamol (ROBAXIN) 750 MG tablet Take 1 tablet (750 mg total) by mouth every 8 (eight) hours as needed for muscle spasms.  . metoprolol succinate (TOPROL-XL) 100 MG 24 hr tablet Take 100 mg by mouth daily.  . polyethylene glycol (MIRALAX / GLYCOLAX) 17 g packet Take 17 g by mouth daily.  . sertraline (ZOLOFT) 100 MG tablet Take 100 mg by mouth daily.  . traMADol (ULTRAM) 50 MG tablet Take 0.5-1 tablets (25-50 mg total) by mouth every 6 (six) hours as needed for moderate pain or severe pain.   No facility-administered encounter medications on file as of 03/12/2020.     Past Medical History:  Diagnosis Date  . Cancer ([facility])   . Dementia ([facility])   . History of kidney stones     numerous- Lithrotrispy  . Hypertension   . Hypothyroidism   . Kidney stones   . Memory loss     Past Surgical History:  Procedure Laterality Date  . ADJACENT TISSUE TRANSFER/TISSUE REARRANGEMENT N/A 10/04/2019   Procedure: Excision of scalp squamous cell carcinoma and reconstruction with adjacent tissue transfer;  Surgeon: Cindra Presume, MD;  Location: [facility];  Service: Plastics;  Laterality: N/A;  . AMPUTATION Left 01/31/2018  Procedure: LEFT FOOT SECOND TOE AMPUTATION;  Surgeon: Newt Minion, MD;  Location: [facility];  Service: Orthopedics;  Laterality: Left;  . AMPUTATION Left 08/29/2018   Procedure: LEFT 3RD TOE AMPUTATION;  Surgeon: Newt Minion, MD;  Location: [facility];  Service: Orthopedics;  Laterality: Left;  . CHOLECYSTECTOMY    . EYE SURGERY Bilateral    catract  . left first toe AMPUTATION    . NISSEN FUNDOPLICATION      Family History  Problem Relation Age of Onset  . Memory loss Mother   . Memory loss Father     Social History   Social History Narrative  . Not on file     Review of Systems General: Denies fevers, chills, weight loss CV: Denies  chest pain, shortness of breath, palpitations  Physical Exam Vitals with BMI 03/12/2020 03/06/2020 11/26/2019  Height 6\' 0"  5\' 10"  -  Weight 178 lbs 171 lbs 6 oz -  BMI 56.31 49.70 -  Systolic 263 785 885  Diastolic 70 78 56  Pulse 84 56 62    General:  No acute distress,  Alert and oriented, Non-Toxic, Normal speech and affect Examination shows a 5 cm mass in the left occipital scalp within the hairbearing skin. It feels deep and fixed. There is a small superficial ulceration. I do not obviously appreciate any lymphadenopathy in the neck.  Assessment/Plan Patient presents with what appears to be metastatic squamous cell carcinoma to the posterior occipital scalp. I agree with the imaging to determine the depth of invasion which in my mind would help determine his suitability for excision. The area certainly could be excised and likely closed with adjacent tissue transfer. If there is deeper invasion of the skull the utility of surgical excision might be more limited as negative margins would be more difficult to obtain. I will plan to follow-up the results of the imaging and see him again after that been done. All the questions were answered.  Cindra Presume 03/12/2020, 10:43 AM

## 2020-03-18 DIAGNOSIS — C4490 Unspecified malignant neoplasm of skin, unspecified: Secondary | ICD-10-CM | POA: Insufficient documentation

## 2020-03-24 ENCOUNTER — Ambulatory Visit (HOSPITAL_COMMUNITY)
Admission: RE | Admit: 2020-03-24 | Discharge: 2020-03-24 | Disposition: A | Discharge status: Home / Self Care | Payer: Medicare Other | Source: Ambulatory Visit | Attending: Oncology | Admitting: Oncology

## 2020-03-24 ENCOUNTER — Other Ambulatory Visit: Payer: Self-pay

## 2020-03-24 DIAGNOSIS — C7951 Secondary malignant neoplasm of bone: Secondary | ICD-10-CM | POA: Insufficient documentation

## 2020-03-24 DIAGNOSIS — C4490 Unspecified malignant neoplasm of skin, unspecified: Secondary | ICD-10-CM | POA: Diagnosis not present

## 2020-03-24 DIAGNOSIS — I700 Atherosclerosis of aorta: Secondary | ICD-10-CM | POA: Insufficient documentation

## 2020-03-24 DIAGNOSIS — I2510 Atherosclerotic heart disease of native coronary artery without angina pectoris: Secondary | ICD-10-CM | POA: Diagnosis not present

## 2020-03-24 LAB — GLUCOSE, CAPILLARY: Glucose-Capillary: 92 mg/dL (ref 70–99)

## 2020-03-24 MED ORDER — FLUDEOXYGLUCOSE F - 18 (FDG) INJECTION
8.8800 | Freq: Once | INTRAVENOUS | Status: AC | PRN
  Administered 2020-03-24: 8.88 via INTRAVENOUS

## 2020-03-27 ENCOUNTER — Inpatient Hospital Stay (HOSPITAL_BASED_OUTPATIENT_CLINIC_OR_DEPARTMENT_OTHER): Payer: Medicare Other | Admitting: Oncology

## 2020-03-27 ENCOUNTER — Ambulatory Visit: Payer: Medicare Other | Admitting: Oncology

## 2020-03-27 DIAGNOSIS — C4440 Unspecified malignant neoplasm of skin of scalp and neck: Secondary | ICD-10-CM

## 2020-03-27 DIAGNOSIS — C4490 Unspecified malignant neoplasm of skin, unspecified: Secondary | ICD-10-CM

## 2020-03-27 NOTE — Progress Notes (Signed)
Hematology and Oncology Follow Up for Telemedicine Visits  Joel Ibarra [id number] 10/28/1934 86 y.o. 03/27/2020 2:21 PM Javier Glazier, MDPiazza, Christian Mate, MD   I connected with Joel Ibarra on 03/27/20 at  3:30 PM EST by telephone visit and verified that I am speaking with the correct person using two identifiers.   I discussed the limitations, risks, security and privacy concerns of performing an evaluation and management service by telemedicine and the availability of in-person appointments. I also discussed with the patient that there may be a patient responsible charge related to this service. The patient expressed understanding and agreed to proceed.  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  He was found to have advanced disease with lymphadenopathy documented in January 2022.   Prior Therapy: He status post surgical resection of scalp tumor on October 04, 2019 completed by Dr. Claudia Desanctis.  At that time he was found to have squamous cell carcinoma.  Current therapy: Under evaluation for possible therapy.  Interim History: Joel Ibarra no recent issues or complications.  He is unable to provide history because of memory issues.  According to his daughter he has no pain or discomfort at this time.  He does pick on his scalp lesion occasionally which causes some bleeding at times.  Performance status is limited predominantly related to his dementia.   Medications: I have reviewed the patient's current medications.  Current Outpatient Medications  Medication Sig Dispense Refill  . acetaminophen (TYLENOL) 325 MG tablet Take 2 tablets (650 mg total) by mouth every 6 (six) hours as needed for mild pain.    Marland Kitchen aspirin EC 81 MG tablet Take 81 mg by mouth daily. Swallow  whole.    . docusate sodium (COLACE) 100 MG capsule Take 1 capsule (100 mg total) by mouth 2 (two) times daily. 10 capsule 0  . donepezil (ARICEPT) 5 MG tablet Take 5 mg by mouth daily.    Marland Kitchen lidocaine (LIDODERM) 5 % Place one patch onto skin daily. Remove & Discard patch within 12 hours or as directed by MD 30 patch 0  . melatonin 5 MG TABS Take 5 mg by mouth at bedtime as needed (insomnia).    . memantine (NAMENDA) 5 MG tablet Take 5 mg by mouth daily.    . methocarbamol (ROBAXIN) 750 MG tablet Take 1 tablet (750 mg total) by mouth every 8 (eight) hours as needed for muscle spasms.    . metoprolol succinate (TOPROL-XL) 100 MG 24 hr tablet Take 100 mg by mouth daily.    . polyethylene glycol (MIRALAX / GLYCOLAX) 17 g packet Take 17 g by mouth daily. 14 each 0  . sertraline (ZOLOFT) 100 MG tablet Take 100 mg by mouth daily.    . traMADol (ULTRAM) 50 MG tablet Take 0.5-1 tablets (25-50 mg total) by mouth every 6 (six) hours as needed for moderate pain or severe pain. 20 tablet 0   No current facility-administered medications for this visit.     Allergies:  Allergies  Allergen Reactions  . Bee Venom Swelling  . Codeine Nausea And Vomiting, Nausea Only, Swelling and Other (See Comments)  . Daptomycin Other (See Comments)    Pneumonitis  . Clindamycin/Lincomycin Rash  . Lactose Diarrhea and Other (See Comments)    Cramps  Lab Results: Lab Results  Component Value Date   WBC 8.7 11/22/2019   HGB 9.4 (L) 11/22/2019   HCT 29.1 (L) 11/22/2019   MCV 99.3 11/22/2019   PLT 217 11/22/2019     Chemistry      Component Value Date/Time   NA 135 11/23/2019 0337   K 3.8 11/23/2019 0337   CL 102 11/23/2019 0337   CO2 25 11/23/2019 0337   BUN 17 11/23/2019 0337   CREATININE 1.10 11/23/2019 0337      Component Value Date/Time   CALCIUM 8.4 (L) 11/23/2019 0337   ALKPHOS 61 02/01/2018 0151   AST 20 02/01/2018 0151   ALT 15 02/01/2018 0151   BILITOT 0.7 02/01/2018 0151        Radiological Studies: IMPRESSION: 1. Lesion within the left posterior scalp is FDG avid and compatible with recurrent squamous cell carcinoma. 2. FDG avid bilateral cervical lymph nodes compatible with nodal metastasis. 3. Multifocal FDG avid lytic bone metastases are identified most this is most conspicuous within the T8 vertebral body and right acetabulum. There is diffuse uptake within the remaining portions of the visualized axial skeleton which is nonspecific and may bone marrow involvement by tumor versus treatment related changes. 4. Aortic Atherosclerosis (ICD10-I70.0). Coronary artery calcifications.   Impression and Plan:  86 year old man with:  1.  Squamous cell carcinoma of the scalp diagnosed in August 2021.  He subsequently developed recurrent disease with lesion on the scalp as well as metastatic disease documented in January 2022.Marland Kitchen   PET scan completed on March 24, 2020 was reviewed today which showed advanced disease.  He has metastatic involvement lymph nodes as well as the bone.  Treatment options moving forward were discussed.  There is no curative treatment at this point and any treatment would be palliative.  The goal of any treatment would be to relieve any symptoms he develops pain.  Local treatment with surgical resection or radiation can be implemented if he has local discomfort.  The role for PD-L1 inhibitors systemic therapy were discussed.  Risks and benefits of this class of drug were reviewed and success rate was also reiterated.  Any treatment option at this time is limited because of his memory issue is inability to withstand any treatment.  According to his daughter, he would likely be confused would affect his quality of life dramatically.  She is in favor only treating his symptoms at this time.  After discussion, I recommended supportive care only and proceeding with hospice care if he develops worsening clinical status.  Prognosis was  discussed today and likely limited life expectancy between 6 to 12 months possibly less than especially if he has worsening memory issues.  Joel Ibarra understands always presentation and the prognosis and all her questions were answered.  She will discuss this with her sister and they will consider hospice in the future.  2.  Follow-up: Will be as needed in the future.     I discussed the assessment and treatment plan with the patient. The patient was provided an opportunity to ask questions and all were answered. The patient agreed with the plan and demonstrated an understanding of the instructions.   The patient was advised to call back or seek an in-person evaluation if the symptoms worsen or if the condition fails to improve as anticipated.  I provided 20 minutes of non face-to-face telephone visit time during this encounter.  The time was dedicated to reviewing imaging studies, treatment options and future plan of care discussion.  Joel Button, MD 03/27/2020 2:21 PM

## 2020-05-29 DEATH — deceased

## 2021-03-05 IMAGING — DX DG CHEST 1V PORT
1 series · 1 of 1 positions shown · non-contrast
Comparison: 11/21/2019.  CT 11/17/2019.

CLINICAL DATA: Chest tube.  History of rib fractures.

EXAM:
PORTABLE CHEST 1 VIEW

[chest ap]
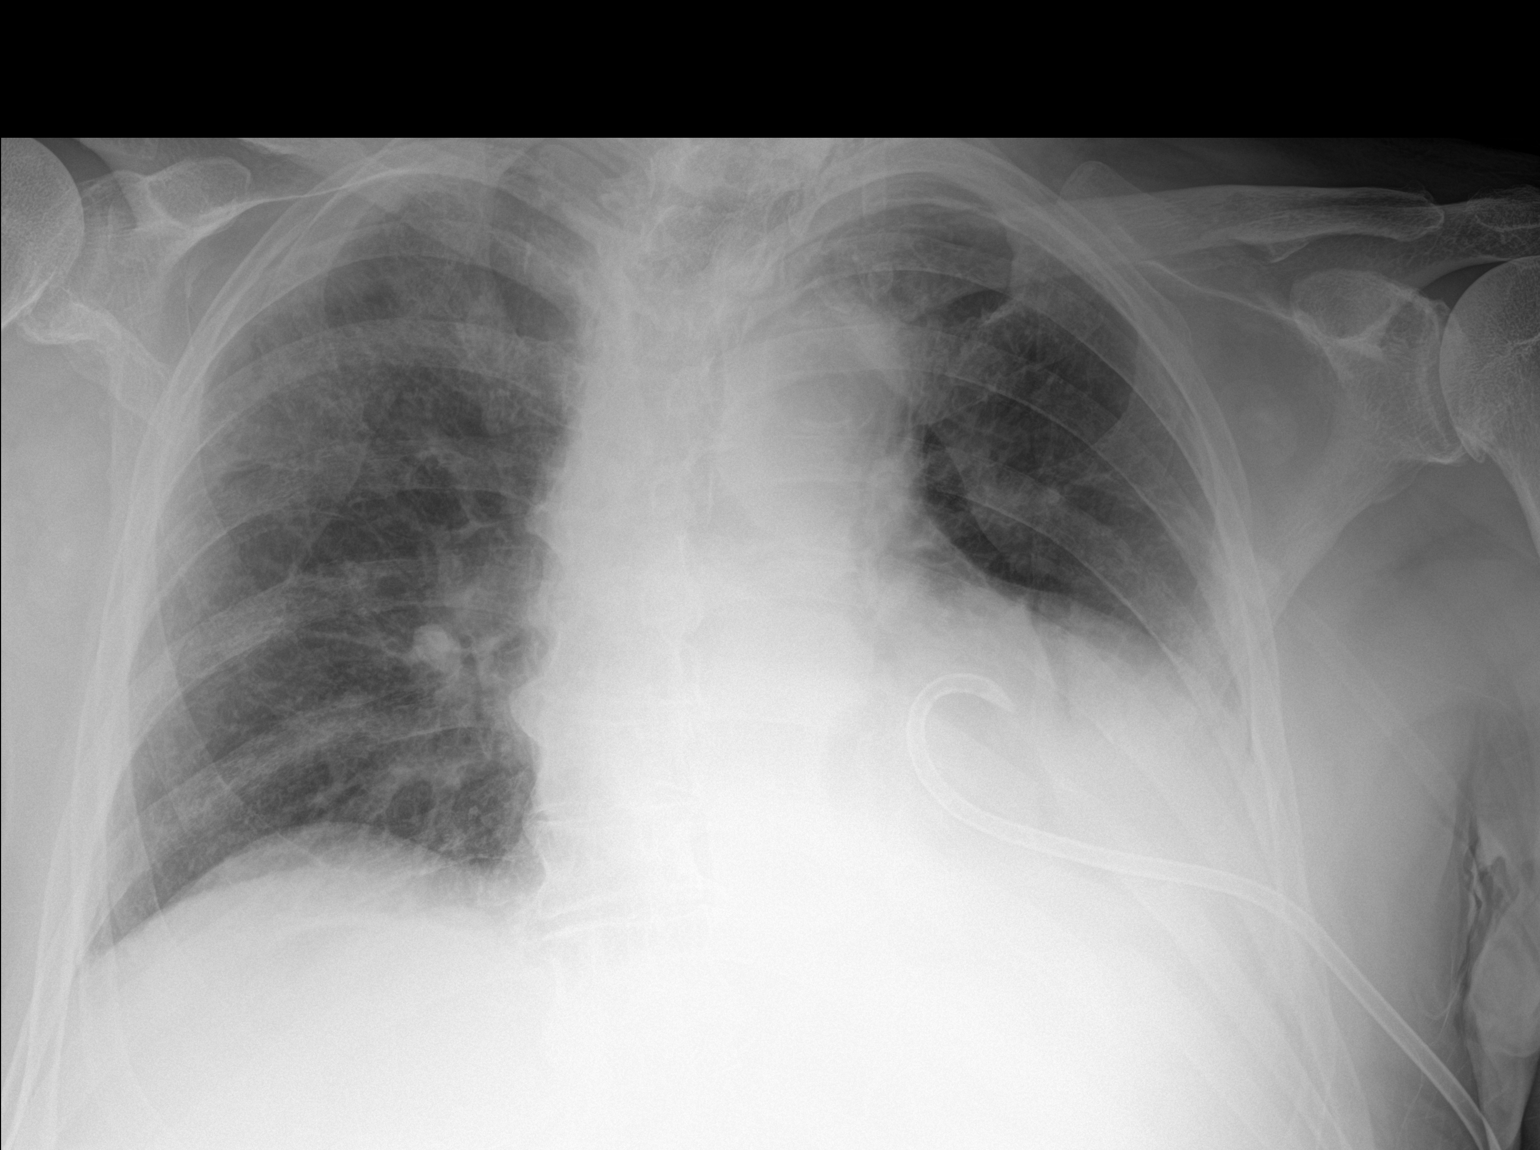

[1 of 1 positions shown; findings below may reference images not displayed]

FINDINGS: Left chest tube in stable position. Moderate left medial
pneumothorax cannot be excluded. Follow-up chest x-rays suggested to
continue to evaluate. Persistent bilateral interstitial prominence
and dense left base atelectasis/consolidation. Persistent small left
pleural effusion. Left lower rib fractures best identified by prior
CT.
IMPRESSION: 1. Left chest tube in stable position. Moderate left medial
pneumothorax cannot be excluded. Follow-up chest x-ray suggested to
continue to evaluate.

2. Persistent bilateral interstitial prominence and dense left base
atelectasis/consolidation. Persistent small left pleural effusion.

3.  Left lower rib fractures best identified by prior CT.

Critical Value/emergent results were called by telephone at the time
of interpretation on 11/22/2019 at [DATE] to nurse [place], who verbally
acknowledged these results.
